# Patient Record
Sex: Female | Born: 1955
Health system: Southern US, Community
[De-identification: ages and names within clinical notes are randomized; demographics above are authoritative.]

## PROBLEM LIST (undated history)

## (undated) DIAGNOSIS — Z973 Presence of spectacles and contact lenses: Secondary | ICD-10-CM

## (undated) DIAGNOSIS — F329 Major depressive disorder, single episode, unspecified: Secondary | ICD-10-CM

## (undated) DIAGNOSIS — R079 Chest pain, unspecified: Secondary | ICD-10-CM

## (undated) DIAGNOSIS — K5909 Other constipation: Secondary | ICD-10-CM

## (undated) DIAGNOSIS — F32A Depression, unspecified: Secondary | ICD-10-CM

## (undated) DIAGNOSIS — Z972 Presence of dental prosthetic device (complete) (partial): Secondary | ICD-10-CM

## (undated) DIAGNOSIS — Z862 Personal history of diseases of the blood and blood-forming organs and certain disorders involving the immune mechanism: Secondary | ICD-10-CM

## (undated) DIAGNOSIS — I10 Essential (primary) hypertension: Secondary | ICD-10-CM

## (undated) DIAGNOSIS — E559 Vitamin D deficiency, unspecified: Secondary | ICD-10-CM

## (undated) DIAGNOSIS — D219 Benign neoplasm of connective and other soft tissue, unspecified: Secondary | ICD-10-CM

## (undated) DIAGNOSIS — N950 Postmenopausal bleeding: Secondary | ICD-10-CM

## (undated) DIAGNOSIS — K219 Gastro-esophageal reflux disease without esophagitis: Secondary | ICD-10-CM

## (undated) DIAGNOSIS — J45909 Unspecified asthma, uncomplicated: Secondary | ICD-10-CM

## (undated) HISTORY — DX: Essential (primary) hypertension: I10

## (undated) HISTORY — DX: Benign neoplasm of connective and other soft tissue, unspecified: D21.9

## (undated) HISTORY — PX: COLONOSCOPY: SHX174

## (undated) HISTORY — DX: Depression, unspecified: F32.A

## (undated) HISTORY — DX: Gastro-esophageal reflux disease without esophagitis: K21.9

## (undated) HISTORY — DX: Unspecified asthma, uncomplicated: J45.909

## (undated) HISTORY — DX: Major depressive disorder, single episode, unspecified: F32.9

## (undated) HISTORY — DX: Chest pain, unspecified: R07.9

---

## 1998-11-25 ENCOUNTER — Ambulatory Visit (HOSPITAL_COMMUNITY): Admission: RE | Admit: 1998-11-25 | Discharge: 1998-11-25 | Payer: Self-pay | Admitting: Internal Medicine

## 1998-11-25 ENCOUNTER — Encounter: Payer: Self-pay | Admitting: Internal Medicine

## 2000-09-13 ENCOUNTER — Encounter: Payer: Self-pay | Admitting: Internal Medicine

## 2000-09-13 ENCOUNTER — Encounter: Admission: RE | Admit: 2000-09-13 | Discharge: 2000-09-13 | Payer: Self-pay | Admitting: Internal Medicine

## 2001-09-17 ENCOUNTER — Encounter: Payer: Self-pay | Admitting: Internal Medicine

## 2001-09-17 ENCOUNTER — Encounter: Admission: RE | Admit: 2001-09-17 | Discharge: 2001-09-17 | Payer: Self-pay | Admitting: Internal Medicine

## 2002-02-27 ENCOUNTER — Emergency Department (HOSPITAL_COMMUNITY): Admission: EM | Admit: 2002-02-27 | Discharge: 2002-02-27 | Payer: Self-pay

## 2003-03-20 ENCOUNTER — Encounter: Payer: Self-pay | Admitting: Internal Medicine

## 2003-03-20 ENCOUNTER — Encounter: Admission: RE | Admit: 2003-03-20 | Discharge: 2003-03-20 | Payer: Self-pay | Admitting: Internal Medicine

## 2004-10-27 ENCOUNTER — Ambulatory Visit: Payer: Self-pay | Admitting: Internal Medicine

## 2004-11-11 ENCOUNTER — Encounter: Admission: RE | Admit: 2004-11-11 | Discharge: 2004-11-11 | Payer: Self-pay | Admitting: Internal Medicine

## 2005-07-07 ENCOUNTER — Emergency Department (HOSPITAL_COMMUNITY): Admission: EM | Admit: 2005-07-07 | Discharge: 2005-07-07 | Payer: Self-pay | Admitting: Emergency Medicine

## 2005-09-06 ENCOUNTER — Ambulatory Visit: Payer: Self-pay | Admitting: Internal Medicine

## 2005-11-22 ENCOUNTER — Ambulatory Visit: Payer: Self-pay | Admitting: Internal Medicine

## 2005-11-24 ENCOUNTER — Ambulatory Visit: Payer: Self-pay | Admitting: Internal Medicine

## 2005-11-26 ENCOUNTER — Emergency Department (HOSPITAL_COMMUNITY): Admission: EM | Admit: 2005-11-26 | Discharge: 2005-11-26 | Payer: Self-pay | Admitting: Family Medicine

## 2005-12-05 ENCOUNTER — Encounter: Admission: RE | Admit: 2005-12-05 | Discharge: 2005-12-05 | Payer: Self-pay | Admitting: Internal Medicine

## 2005-12-09 ENCOUNTER — Ambulatory Visit: Payer: Self-pay | Admitting: Internal Medicine

## 2005-12-09 ENCOUNTER — Encounter (INDEPENDENT_AMBULATORY_CARE_PROVIDER_SITE_OTHER): Payer: Self-pay | Admitting: *Deleted

## 2005-12-28 ENCOUNTER — Encounter: Admission: RE | Admit: 2005-12-28 | Discharge: 2005-12-28 | Payer: Self-pay | Admitting: Internal Medicine

## 2005-12-29 ENCOUNTER — Emergency Department (HOSPITAL_COMMUNITY): Admission: EM | Admit: 2005-12-29 | Discharge: 2005-12-29 | Payer: Self-pay | Admitting: Family Medicine

## 2006-06-12 ENCOUNTER — Ambulatory Visit (HOSPITAL_COMMUNITY): Admission: RE | Admit: 2006-06-12 | Discharge: 2006-06-12 | Payer: Self-pay | Admitting: Family Medicine

## 2006-06-12 ENCOUNTER — Emergency Department (HOSPITAL_COMMUNITY): Admission: EM | Admit: 2006-06-12 | Discharge: 2006-06-12 | Payer: Self-pay | Admitting: Family Medicine

## 2006-12-08 ENCOUNTER — Ambulatory Visit: Payer: Self-pay | Admitting: Internal Medicine

## 2006-12-11 ENCOUNTER — Encounter: Payer: Self-pay | Admitting: Internal Medicine

## 2006-12-11 LAB — CONVERTED CEMR LAB
Alkaline Phosphatase: 55 units/L (ref 39–117)
BUN: 14 mg/dL (ref 6–23)
Bilirubin, Direct: 0.1 mg/dL (ref 0.0–0.3)
CO2: 33 meq/L — ABNORMAL HIGH (ref 19–32)
Cholesterol: 205 mg/dL (ref 0–200)
Direct LDL: 149.1 mg/dL
GFR calc Af Amer: 85 mL/min
Glucose, Bld: 95 mg/dL (ref 70–99)
HDL: 45 mg/dL (ref >39.0)
Potassium: 3.6 meq/L (ref 3.5–5.1)
Sodium: 143 meq/L (ref 135–145)
TSH: 2.23 microintl units/mL (ref 0.35–5.50)
Total Bilirubin: 0.8 mg/dL (ref 0.3–1.2)
Total Protein: 6.9 g/dL (ref 6.0–8.3)
Triglycerides: 122 mg/dL (ref 0–149)
Vit D, 1,25-Dihydroxy: 20 (ref 20–57)

## 2007-01-27 ENCOUNTER — Emergency Department (HOSPITAL_COMMUNITY): Admission: EM | Admit: 2007-01-27 | Discharge: 2007-01-27 | Payer: Self-pay | Admitting: Family Medicine

## 2007-03-02 ENCOUNTER — Emergency Department (HOSPITAL_COMMUNITY): Admission: EM | Admit: 2007-03-02 | Discharge: 2007-03-02 | Payer: Self-pay | Admitting: Family Medicine

## 2007-05-14 ENCOUNTER — Encounter: Admission: RE | Admit: 2007-05-14 | Discharge: 2007-05-14 | Payer: Self-pay | Admitting: Internal Medicine

## 2007-05-16 ENCOUNTER — Encounter: Admission: RE | Admit: 2007-05-16 | Discharge: 2007-05-16 | Payer: Self-pay | Admitting: Internal Medicine

## 2007-06-09 ENCOUNTER — Encounter: Payer: Self-pay | Admitting: Internal Medicine

## 2007-06-09 DIAGNOSIS — F329 Major depressive disorder, single episode, unspecified: Secondary | ICD-10-CM | POA: Insufficient documentation

## 2007-06-09 DIAGNOSIS — F3289 Other specified depressive episodes: Secondary | ICD-10-CM | POA: Insufficient documentation

## 2007-07-10 ENCOUNTER — Telehealth (INDEPENDENT_AMBULATORY_CARE_PROVIDER_SITE_OTHER): Payer: Self-pay | Admitting: *Deleted

## 2007-08-28 ENCOUNTER — Ambulatory Visit: Payer: Self-pay | Admitting: Internal Medicine

## 2007-08-28 DIAGNOSIS — R635 Abnormal weight gain: Secondary | ICD-10-CM | POA: Insufficient documentation

## 2007-08-28 DIAGNOSIS — E559 Vitamin D deficiency, unspecified: Secondary | ICD-10-CM | POA: Insufficient documentation

## 2007-08-28 DIAGNOSIS — R609 Edema, unspecified: Secondary | ICD-10-CM | POA: Insufficient documentation

## 2007-11-12 ENCOUNTER — Emergency Department (HOSPITAL_COMMUNITY): Admission: EM | Admit: 2007-11-12 | Discharge: 2007-11-12 | Payer: Self-pay | Admitting: Emergency Medicine

## 2007-12-03 ENCOUNTER — Emergency Department (HOSPITAL_COMMUNITY): Admission: EM | Admit: 2007-12-03 | Discharge: 2007-12-03 | Payer: Self-pay | Admitting: Family Medicine

## 2007-12-12 ENCOUNTER — Ambulatory Visit: Payer: Self-pay | Admitting: Internal Medicine

## 2007-12-12 LAB — CONVERTED CEMR LAB
AST: 18 units/L (ref 0–37)
Albumin: 3.9 g/dL (ref 3.5–5.2)
Alkaline Phosphatase: 51 units/L (ref 39–117)
BUN: 23 mg/dL (ref 6–23)
Basophils Relative: 0 % (ref 0.0–1.0)
Chloride: 106 meq/L (ref 96–112)
Creatinine, Ser: 1 mg/dL (ref 0.4–1.2)
Eosinophils Absolute: 0.2 10*3/uL (ref 0.0–0.7)
Eosinophils Relative: 3.4 % (ref 0.0–5.0)
GFR calc Af Amer: 75 mL/min
GFR calc non Af Amer: 62 mL/min
Glucose, Bld: 114 mg/dL — ABNORMAL HIGH (ref 70–99)
HCT: 38.6 % (ref 36.0–46.0)
Hemoglobin: 12.8 g/dL (ref 12.0–15.0)
MCV: 85.3 fL (ref 78.0–100.0)
Monocytes Absolute: 0.4 10*3/uL (ref 0.1–1.0)
Monocytes Relative: 6.2 % (ref 3.0–12.0)
Potassium: 4.1 meq/L (ref 3.5–5.1)
RBC: 4.52 M/uL (ref 3.87–5.11)
TSH: 1.7 microintl units/mL (ref 0.35–5.50)
Total CHOL/HDL Ratio: 4.4
Total Protein: 6.9 g/dL (ref 6.0–8.3)
WBC: 6.4 10*3/uL (ref 4.5–10.5)

## 2007-12-13 ENCOUNTER — Encounter: Payer: Self-pay | Admitting: Internal Medicine

## 2007-12-18 ENCOUNTER — Ambulatory Visit: Payer: Self-pay | Admitting: Internal Medicine

## 2007-12-24 LAB — CONVERTED CEMR LAB
Bilirubin Urine: NEGATIVE
Hemoglobin, Urine: NEGATIVE
Total Protein, Urine: NEGATIVE mg/dL
Urine Glucose: NEGATIVE mg/dL
Urobilinogen, UA: 0.2 (ref 0.0–1.0)

## 2008-01-23 ENCOUNTER — Encounter: Payer: Self-pay | Admitting: Internal Medicine

## 2008-03-20 ENCOUNTER — Emergency Department (HOSPITAL_COMMUNITY): Admission: EM | Admit: 2008-03-20 | Discharge: 2008-03-20 | Payer: Self-pay | Admitting: Emergency Medicine

## 2008-04-25 ENCOUNTER — Ambulatory Visit: Payer: Self-pay | Admitting: Internal Medicine

## 2008-04-25 DIAGNOSIS — R42 Dizziness and giddiness: Secondary | ICD-10-CM | POA: Insufficient documentation

## 2008-05-19 ENCOUNTER — Encounter: Admission: RE | Admit: 2008-05-19 | Discharge: 2008-05-19 | Payer: Self-pay | Admitting: Internal Medicine

## 2008-12-29 ENCOUNTER — Emergency Department (HOSPITAL_COMMUNITY): Admission: EM | Admit: 2008-12-29 | Discharge: 2008-12-29 | Payer: Self-pay | Admitting: Family Medicine

## 2009-06-02 ENCOUNTER — Encounter: Admission: RE | Admit: 2009-06-02 | Discharge: 2009-06-02 | Payer: Self-pay | Admitting: Internal Medicine

## 2009-09-22 ENCOUNTER — Emergency Department (HOSPITAL_COMMUNITY): Admission: EM | Admit: 2009-09-22 | Discharge: 2009-09-22 | Payer: Self-pay | Admitting: Family Medicine

## 2009-11-24 ENCOUNTER — Emergency Department (HOSPITAL_COMMUNITY): Admission: EM | Admit: 2009-11-24 | Discharge: 2009-11-24 | Payer: Self-pay | Admitting: Gastroenterology

## 2009-12-15 ENCOUNTER — Ambulatory Visit: Payer: Self-pay | Admitting: Internal Medicine

## 2009-12-15 DIAGNOSIS — J309 Allergic rhinitis, unspecified: Secondary | ICD-10-CM | POA: Insufficient documentation

## 2009-12-16 LAB — CONVERTED CEMR LAB
Alkaline Phosphatase: 58 units/L (ref 39–117)
BUN: 12 mg/dL (ref 6–23)
Basophils Absolute: 0.1 10*3/uL (ref 0.0–0.1)
Basophils Relative: 1.1 % (ref 0.0–3.0)
Bilirubin Urine: NEGATIVE
Bilirubin, Direct: 0.1 mg/dL (ref 0.0–0.3)
CO2: 31 meq/L (ref 19–32)
Calcium: 9.1 mg/dL (ref 8.4–10.5)
Chloride: 105 meq/L (ref 96–112)
Creatinine, Ser: 1 mg/dL (ref 0.4–1.2)
Eosinophils Absolute: 0.2 10*3/uL (ref 0.0–0.7)
Glucose, Bld: 79 mg/dL (ref 70–99)
Leukocytes, UA: NEGATIVE
Lymphocytes Relative: 29.4 % (ref 12.0–46.0)
MCHC: 34.3 g/dL (ref 30.0–36.0)
MCV: 82.5 fL (ref 78.0–100.0)
Monocytes Absolute: 0.5 10*3/uL (ref 0.1–1.0)
Neutrophils Relative %: 58.9 % (ref 43.0–77.0)
Nitrite: NEGATIVE
Platelets: 235 10*3/uL (ref 150.0–400.0)
RDW: 14.8 % — ABNORMAL HIGH (ref 11.5–14.6)
TSH: 2.33 microintl units/mL (ref 0.35–5.50)
Total Bilirubin: 0.6 mg/dL (ref 0.3–1.2)
Total Protein: 6.9 g/dL (ref 6.0–8.3)
pH: 5.5 (ref 5.0–8.0)

## 2010-04-08 ENCOUNTER — Telehealth: Payer: Self-pay | Admitting: Internal Medicine

## 2010-05-08 ENCOUNTER — Emergency Department (HOSPITAL_COMMUNITY): Admission: EM | Admit: 2010-05-08 | Discharge: 2010-05-08 | Payer: Self-pay | Admitting: Emergency Medicine

## 2010-05-13 ENCOUNTER — Emergency Department (HOSPITAL_COMMUNITY): Admission: EM | Admit: 2010-05-13 | Discharge: 2010-05-13 | Payer: Self-pay | Admitting: Emergency Medicine

## 2010-07-14 ENCOUNTER — Encounter: Admission: RE | Admit: 2010-07-14 | Discharge: 2010-07-14 | Payer: Self-pay | Admitting: Internal Medicine

## 2010-08-25 ENCOUNTER — Emergency Department (HOSPITAL_COMMUNITY)
Admission: EM | Admit: 2010-08-25 | Discharge: 2010-08-25 | Payer: Self-pay | Source: Home / Self Care | Admitting: Family Medicine

## 2010-09-03 ENCOUNTER — Ambulatory Visit
Admission: RE | Admit: 2010-09-03 | Discharge: 2010-09-03 | Payer: Self-pay | Source: Home / Self Care | Attending: Internal Medicine | Admitting: Internal Medicine

## 2010-09-03 DIAGNOSIS — R030 Elevated blood-pressure reading, without diagnosis of hypertension: Secondary | ICD-10-CM | POA: Insufficient documentation

## 2010-09-03 DIAGNOSIS — Z860100 Personal history of colon polyps, unspecified: Secondary | ICD-10-CM | POA: Insufficient documentation

## 2010-09-12 ENCOUNTER — Encounter: Payer: Self-pay | Admitting: Internal Medicine

## 2010-09-23 NOTE — Progress Notes (Signed)
Summary: Phentermine Rf  Phone Note Refill Request Message from:  Fax from Pharmacy  Refills Requested: Medication #1:  PHENTERMINE HCL 37.5 MG TABS 1 by mouth qam   Dosage confirmed as above?Dosage Confirmed   Supply Requested: 30   Last Refilled: 02/24/2010 [facility]   [phone]   Method Requested: Telephone to Pharmacy Initial call taken by: Lanier Prude, [facility]),  April 08, 2010 2:57 PM  Follow-up for Phone Call        ok to ref once  needs ov Follow-up by: Tresa Garter MD,  April 09, 2010 7:32 AM    Prescriptions: PHENTERMINE HCL 37.5 MG TABS (PHENTERMINE HCL) 1 by mouth qam  #30 x 0   Entered by:   Ami Bullins CMA   Authorized by:   Tresa Garter MD   Signed by:   Bill Salinas CMA on 04/09/2010   Method used:   Telephoned to ...       [facility] [address] [phone]* (retail)       [address]       [place], [place]  [address]       Ph: [phone] or [phone]       Fax: [phone]   RxID:   [id number]

## 2010-09-23 NOTE — Assessment & Plan Note (Signed)
Summary: FU--STC   Vital Signs:  Patient profile:   55 year old female Weight:      241.25 pounds (109.66 kg) O2 Sat:      96 % on Room air Temp:     98.2 degrees F (36.78 degrees C) oral Pulse rate:   67 / minute Pulse rhythm:   regular BP sitting:   112 / 72  (left arm) Cuff size:   regular  Vitals Entered By: Brenton Grills (December 15, 2009 4:26 PM)  O2 Flow:  Room air CC: pt here for f/u visit/aj   CC:  pt here for f/u visit/aj.  Current Medications (verified): 1)  Lasix 20 Mg  Tabs (Furosemide) .... As Needed 2)  Klor-Con M20 20 Meq  Tbcr (Potassium Chloride Crys Cr) .... As Needed 3)  Vitamin D3 1000 Unit  Tabs (Cholecalciferol) .Marland Kitchen.. 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   Nose:  External nasal examination shows no deformity or inflammation. Nasal mucosa are pink and moist without lesions or exudates. Mouth:  Oral mucosa and oropharynx without lesions or exudates.  Teeth in good repair. Lungs:  Normal respiratory effort, chest expands symmetrically. Lungs are clear to auscultation, no crackles or wheezes. Heart:  Normal rate and regular rhythm. S1 and S2 normal without gallop, murmur, click, rub or other extra sounds. Extremities:  trace left pedal edema and trace right pedal edema.   Neurologic:  H-p (+) on R Skin:  Intact without suspicious  lesions or rashes Psych:  Cognition and judgment appear intact. Alert and cooperative with normal attention span and concentration. No apparent delusions, illusions, hallucinations Not depressed   Impression & Recommendations:  Problem # 1:  WEIGHT GAIN (ICD-783.1)  Discussed diet Wt watchers Phentermine. Risks vs benefits and controversies of a long term wt loss meds use were discussed.   Orders: TLB-B12, Serum-Total ONLY (04540-J81) TLB-BMP (Basic Metabolic Panel-BMET) (80048-METABOL) TLB-CBC Platelet - w/Differential (85025-CBCD) TLB-Hepatic/Liver Function Pnl (80076-HEPATIC) TLB-Sedimentation Rate (ESR) (85652-ESR) TLB-TSH (Thyroid Stimulating Hormone) (84443-TSH) TLB-Udip ONLY (81003-UDIP)  Problem # 2:  OBESITY (ICD-278.00) Assessment: Deteriorated  Problem # 3:  EDEMA (ICD-782.3) Assessment: Unchanged  Her updated medication list for this problem includes:    Lasix 20 Mg Tabs (Furosemide) .Marland Kitchen... As needed  Orders: TLB-B12, Serum-Total ONLY (19147-W29) TLB-BMP (Basic Metabolic Panel-BMET) (80048-METABOL) TLB-CBC Platelet - w/Differential (85025-CBCD) TLB-Hepatic/Liver Function Pnl (80076-HEPATIC) TLB-Sedimentation Rate (ESR) (85652-ESR) TLB-TSH (Thyroid Stimulating Hormone) (84443-TSH) TLB-Udip ONLY (81003-UDIP)  Problem # 4:  ALLERGIC RHINITIS (ICD-477.9) Assessment: Deteriorated  Her updated medication list for this problem includes:    Loratadine 10 Mg Tabs (Loratadine) .Marland Kitchen... 1 by mouth once daily as needed allergies  Complete Medication List: 1)  Lasix 20 Mg Tabs (Furosemide) .... As needed 2)  Klor-con M20 20 Meq Tbcr (Potassium chloride crys cr) .... As needed 3)  Vitamin D3 1000 Unit Tabs (Cholecalciferol) .Marland Kitchen.. 1 qd  4)  Phentermine Hcl 37.5 Mg Tabs (Phentermine hcl) .Marland Kitchen.. 1 by mouth qam 5)  Loratadine 10 Mg Tabs (Loratadine) .Marland Kitchen.. 1 by mouth once daily as needed allergies  Patient Instructions: 1)  Please schedule a follow-up appointment in 3  months. Prescriptions: LORATADINE 10 MG TABS (LORATADINE) 1 by mouth once daily as needed allergies  #90 x 2   Entered and Authorized by:   Tresa Garter MD   Signed by:   Tresa Garter MD on 12/15/2009   Method used:   Print then Give to Patient   RxID:   [id number] KLOR-CON M20 20 MEQ  TBCR (POTASSIUM CHLORIDE CRYS CR) as needed  #30 Tablet x 12   Entered and Authorized by:   Tresa Garter MD   Signed by:   Tresa Garter MD on 12/15/2009   Method used:   Print then Give to Patient   RxID:   [id number] LASIX 20 MG  TABS (FUROSEMIDE) as needed  #30 x 12   Entered and Authorized by:   Tresa Garter MD   Signed by:   Tresa Garter MD on 12/15/2009   Method used:   Print then Give to Patient   RxID:   [id number] PHENTERMINE HCL 37.5 MG TABS (PHENTERMINE HCL) 1 by mouth qam  #30 x 2   Entered and Authorized by:   Tresa Garter MD   Signed by:   Tresa Garter MD on 12/15/2009   Method used:   Print then Give to Patient   RxID:   [id number]

## 2010-09-23 NOTE — Assessment & Plan Note (Signed)
Summary: FU---STC   Vital Signs:  Patient profile:   55 year old female Weight:      246 pounds Temp:     98.0 degrees F oral Pulse rate:   84 / minute Pulse rhythm:   regular Resp:     16 per minute BP sitting:   138 / 90  (left arm) Cuff size:   large  Vitals Entered By: Lanier Prude, CMA(AAMA) (September 03, 2010 9:30 AM) CC: f/u  Is Patient Diabetic? No   CC:  f/u .  History of Present Illness: The patient presents for a preventive health examination  C/o obesity  Current Medications (verified): 1)  Lasix 20 Mg  Tabs (Furosemide) .... As Needed 2)  Klor-Con M20 20 Meq  Tbcr (Potassium Chloride Crys Cr) .... As Needed 3)  Vitamin D3 1000 Unit  Tabs (Cholecalciferol) .Marland Kitchen.. 1 Qd 4)  Phentermine Hcl 37.5 Mg Tabs (Phentermine Hcl) .Marland Kitchen.. 1 By Mouth Qam 5)  Loratadine 10 Mg Tabs (Loratadine) .Marland Kitchen.. 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 Former Smoker  Past Medical History: Depression Fluid retention Obesity family h/o dm-2 family h/o breast ca      mother colon ca      sister berast ca Elevated BP    Allergic rhinitis Colonic polyps, hx of  Review of Systems  The patient denies anorexia, fever, weight loss, weight gain, vision loss, decreased hearing, hoarseness, chest pain, syncope, dyspnea on exertion, peripheral edema, prolonged cough, headaches, hemoptysis, abdominal pain, melena, hematochezia, severe indigestion/heartburn, hematuria, incontinence, genital sores, muscle weakness, suspicious skin lesions,  transient blindness, difficulty walking, depression, unusual weight change, abnormal bleeding, enlarged lymph nodes, angioedema, and breast masses.    Physical Exam  General:  overweight-appearing.   Head:  Normocephalic and atraumatic without obvious abnormalities. No apparent alopecia or balding. Eyes:  No corneal or conjunctival inflammation noted. EOMI. Perrla. Ears:  External ear exam shows no significant lesions or deformities.  Otoscopic examination reveals clear canals, tympanic membranes are intact bilaterally without bulging, retraction, inflammation or discharge. Hearing is grossly normal bilaterally. Nose:  External nasal examination shows no deformity or inflammation. Nasal mucosa are pink and moist without lesions or exudates. Mouth:  Oral mucosa and oropharynx without lesions or exudates.  Teeth in good repair. Neck:  No deformities, masses, or tenderness noted. Lungs:  Normal respiratory effort, chest expands symmetrically. Lungs are clear to auscultation, no crackles or wheezes. Heart:  Normal rate and regular rhythm. S1 and S2 normal without gallop, murmur, click, rub or other extra sounds. Abdomen:  Bowel sounds positive,abdomen soft and non-tender without masses, organomegaly or hernias noted. Msk:  No deformity or scoliosis noted of thoracic or lumbar spine.   Pulses:  R and L carotid,radial,femoral,dorsalis pedis and posterior tibial pulses are full and equal bilaterally Extremities:  No clubbing, cyanosis, edema, or deformity noted with normal full range of motion of all joints.   Neurologic:  No cranial nerve deficits noted. Station and gait are normal. Plantar reflexes are down-going bilaterally. DTRs are symmetrical throughout. Sensory, motor and coordinative functions appear intact. Skin:  Intact  without suspicious lesions or rashes Cervical Nodes:  No lymphadenopathy noted Inguinal Nodes:  No significant adenopathy Psych:  Cognition and judgment appear intact. Alert  and cooperative with normal attention span and concentration. No apparent delusions, illusions, hallucinations   Impression & Recommendations:  Problem # 1:  HEALTH MAINTENANCE EXAM (ICD-V70.0) Assessment New Sch GYN Colon due this year Health and age related issues were discussed. Available screening tests and vaccinations were discussed as well. Healthy life style including good diet and exercise was discussed.  Will get labs  Problem # 2:  OBESITY (ICD-278.00) Assessment: Deteriorated On the regimen of medicine(s) reflected in the chart   Risks vs benefits and controversies of a long term phentermine  use were discussed.   Problem # 3:  ELEVATED BLOOD PRESSURE (ICD-796.2) Assessment: New  The following medications were removed from the medication list:    Lasix 20 Mg Tabs (Furosemide) .Marland Kitchen... As needed Her updated medication list for this problem includes:    Hydrochlorothiazide 12.5 Mg Caps (Hydrochlorothiazide) .Marland Kitchen... 1 by mouth once daily for blood pressure  Problem # 4:  ALLERGIC RHINITIS (ICD-477.9) Assessment: Unchanged  Her updated medication list for this problem includes:    Loratadine 10 Mg Tabs (Loratadine) .Marland Kitchen... 1 by mouth once daily as needed allergies  Problem # 5:  VITAMIN D DEFICIENCY (ICD-268.9) Assessment: Comment Only On the regimen of medicine(s) reflected in the chart    Complete Medication List: 1)  Vitamin D3 1000 Unit Tabs (Cholecalciferol) .Marland Kitchen.. 1 qd 2)  Phentermine Hcl 37.5 Mg Tabs (Phentermine hcl) .Marland Kitchen.. 1 by mouth qam 3)  Loratadine 10 Mg Tabs (Loratadine) .Marland Kitchen.. 1 by mouth once daily as needed allergies 4)  Hydrochlorothiazide 12.5 Mg Caps (Hydrochlorothiazide) .Marland Kitchen.. 1 by mouth once daily for blood pressure  Other Orders: Gastroenterology Referral (GI)  Patient Instructions: 1)  Please schedule a follow-up appointment in 4 months 2)  Labs this month 3)  BMP prior to visit, ICD-9: 4)  Hepatic Panel prior to visit, ICD-9: 5)  Lipid Panel prior to  visit, ICD-9:v70.0 6)  TSH prior to visit, ICD-9: 7)  CBC w/ Diff prior to visit, ICD-9: 8)  Urine-dip prior to visit, ICD-9: 9)  It is important that you exercise regularly at least 20 minutes 5 times a week. If you develop chest pain, have severe difficulty breathing, or feel very tired , stop exercising immediately and seek medical attention. 10)  You need to lose weight. Consider a lower calorie diet and regular exercise.  Prescriptions: PHENTERMINE HCL 37.5 MG TABS (PHENTERMINE HCL) 1 by mouth qam  #30 x 2   Entered and Authorized by:   Tresa Garter MD   Signed by:   Tresa Garter MD on 09/03/2010   Method used:   Print then Give to Patient   RxID:   [id number] HYDROCHLOROTHIAZIDE 12.5 MG CAPS (HYDROCHLOROTHIAZIDE) 1 by mouth once daily for blood pressure  #90 x 3   Entered and Authorized by:   Tresa Garter MD   Signed by:   Tresa Garter MD on 09/03/2010   Method used:   Electronically to        [facility]* (retail)       [address]       [address] Shipping/mailing       [place], [place]  [address]       Ph: [phone]       Fax: [phone]   RxID:   [id number]  Orders Added: 1)  Gastroenterology Referral [GI] 2)  Est. Patient 40-64 years [id number]

## 2010-10-05 ENCOUNTER — Telehealth: Payer: Self-pay | Admitting: Internal Medicine

## 2010-10-13 NOTE — Progress Notes (Signed)
Summary: Klor Con-ok to Rf?  Phone Note From Pharmacy   [facility] [phone]* Summary of Call: rec Rf request for Klor Con. This med was removed from active list. ok to Rf?? Initial call taken by: Lanier Prude, [facility]),  October 05, 2010 1:49 PM  Follow-up for Phone Call        ok 6 ref Follow-up by: Tresa Garter MD,  October 05, 2010 2:09 PM    New/Updated Medications: KLOR-CON M20 20 MEQ CR-TABS (POTASSIUM CHLORIDE CRYS CR) take 1 by mouth once daily Prescriptions: KLOR-CON M20 20 MEQ CR-TABS (POTASSIUM CHLORIDE CRYS CR) take 1 by mouth once daily  #30 x 5   Entered by:   Lanier Prude, [facility])   Authorized by:   Tresa Garter MD   Signed by:   Lanier Prude, [facility]) on 10/05/2010   Method used:   [facility]ronically to        [facility] [address] [phone]* (retail)       [address]       [place], [place]  [address]       Ph: [phone] or [phone]       Fax: [phone]   RxID:   [id number]

## 2010-11-04 LAB — POCT I-STAT, CHEM 8
Calcium, Ion: 1.11 mmol/L — ABNORMAL LOW (ref 1.12–1.32)
Glucose, Bld: 83 mg/dL (ref 70–99)
HCT: 42 % (ref 36.0–46.0)
Hemoglobin: 14.3 g/dL (ref 12.0–15.0)
Potassium: 4 mEq/L (ref 3.5–5.1)

## 2010-11-04 LAB — D-DIMER, QUANTITATIVE: D-Dimer, Quant: 0.22 ug/mL-FEU (ref 0.00–0.48)

## 2010-12-09 ENCOUNTER — Telehealth: Payer: Self-pay | Admitting: Internal Medicine

## 2010-12-09 NOTE — Telephone Encounter (Signed)
Pt inquiring as to name of MD for GI referral. Pt informed and given telephone contact# for [facility] [[phone]]

## 2010-12-17 ENCOUNTER — Other Ambulatory Visit: Payer: Self-pay | Admitting: Obstetrics and Gynecology

## 2011-01-06 ENCOUNTER — Telehealth: Payer: Self-pay | Admitting: *Deleted

## 2011-01-06 NOTE — Telephone Encounter (Signed)
rec rf req for Phentermine 37.5mg . 1 po qam. # 30. Last filled 11-30-10. Ok to Rf?

## 2011-01-06 NOTE — Telephone Encounter (Signed)
OK to fill this prescription with additional refills x2 Thank you!  

## 2011-01-07 MED ORDER — PHENTERMINE HCL 37.5 MG PO CAPS: 37.5000 mg | ORAL_CAPSULE | ORAL | Status: DC

## 2011-01-18 ENCOUNTER — Ambulatory Visit (AMBULATORY_SURGERY_CENTER): Payer: 59 | Admitting: *Deleted

## 2011-01-18 VITALS — Ht 62.0 in | Wt 238.0 lb

## 2011-01-18 DIAGNOSIS — Z1211 Encounter for screening for malignant neoplasm of colon: Secondary | ICD-10-CM

## 2011-01-18 DIAGNOSIS — Z800 Family history of malignant neoplasm of digestive organs: Secondary | ICD-10-CM

## 2011-01-18 MED ORDER — PEG-KCL-NACL-NASULF-NA ASC-C 100 G PO SOLR: ORAL | Status: DC

## 2011-01-25 ENCOUNTER — Ambulatory Visit (AMBULATORY_SURGERY_CENTER): Payer: 59 | Admitting: Internal Medicine

## 2011-01-25 ENCOUNTER — Encounter: Payer: Self-pay | Admitting: Internal Medicine

## 2011-01-25 VITALS — BP 128/76 | HR 95 | Temp 96.6°F | Resp 20 | Ht 62.0 in | Wt 236.0 lb

## 2011-01-25 DIAGNOSIS — Z860100 Personal history of colon polyps, unspecified: Secondary | ICD-10-CM

## 2011-01-25 DIAGNOSIS — Z800 Family history of malignant neoplasm of digestive organs: Secondary | ICD-10-CM

## 2011-01-25 DIAGNOSIS — Z1211 Encounter for screening for malignant neoplasm of colon: Secondary | ICD-10-CM

## 2011-01-25 MED ORDER — SODIUM CHLORIDE 0.9 % IV SOLN: 500.0000 mL | INTRAVENOUS | Status: DC

## 2011-01-25 NOTE — Patient Instructions (Signed)
Please review discharge instructions.

## 2011-01-26 ENCOUNTER — Telehealth: Payer: Self-pay

## 2011-01-26 NOTE — Telephone Encounter (Signed)

## 2011-05-20 LAB — POCT I-STAT, CHEM 8
Calcium, Ion: 1.2
Chloride: 103
Glucose, Bld: 108 — ABNORMAL HIGH
HCT: 42
TCO2: 29

## 2011-07-25 ENCOUNTER — Other Ambulatory Visit: Payer: Self-pay | Admitting: Internal Medicine

## 2011-07-25 DIAGNOSIS — Z1231 Encounter for screening mammogram for malignant neoplasm of breast: Secondary | ICD-10-CM

## 2011-08-03 ENCOUNTER — Ambulatory Visit
Admission: RE | Admit: 2011-08-03 | Discharge: 2011-08-03 | Disposition: A | Discharge status: Home / Self Care | Payer: 59 | Source: Ambulatory Visit | Attending: Internal Medicine | Admitting: Internal Medicine

## 2011-08-03 DIAGNOSIS — Z1231 Encounter for screening mammogram for malignant neoplasm of breast: Secondary | ICD-10-CM

## 2011-09-21 ENCOUNTER — Other Ambulatory Visit: Payer: Self-pay | Admitting: Internal Medicine

## 2011-10-01 ENCOUNTER — Telehealth: Payer: Self-pay | Admitting: *Deleted

## 2011-10-01 DIAGNOSIS — Z0000 Encounter for general adult medical examination without abnormal findings: Secondary | ICD-10-CM

## 2011-10-01 NOTE — Telephone Encounter (Signed)
CPE labs entered.  

## 2011-10-01 NOTE — Telephone Encounter (Signed)
Message copied by Merrilyn Puma on Sat Oct 01, 2011 12:19 PM ------      Message from: Newell Coral      Created: Fri Sep 23, 2011  3:51 PM      Regarding: cpe sch, needs labs       The pt made a cpe apt for April, and is hoping to get labs put in. Thanks!

## 2011-10-14 ENCOUNTER — Other Ambulatory Visit: Payer: Self-pay | Admitting: Internal Medicine

## 2011-12-12 ENCOUNTER — Ambulatory Visit (INDEPENDENT_AMBULATORY_CARE_PROVIDER_SITE_OTHER): Payer: 59 | Admitting: Internal Medicine

## 2011-12-12 ENCOUNTER — Encounter: Payer: Self-pay | Admitting: Internal Medicine

## 2011-12-12 VITALS — BP 120/76 | HR 84 | Temp 98.6°F | Resp 16 | Ht 62.0 in | Wt 236.0 lb

## 2011-12-12 DIAGNOSIS — F329 Major depressive disorder, single episode, unspecified: Secondary | ICD-10-CM

## 2011-12-12 DIAGNOSIS — Z23 Encounter for immunization: Secondary | ICD-10-CM

## 2011-12-12 DIAGNOSIS — J309 Allergic rhinitis, unspecified: Secondary | ICD-10-CM

## 2011-12-12 DIAGNOSIS — F419 Anxiety disorder, unspecified: Secondary | ICD-10-CM

## 2011-12-12 DIAGNOSIS — K5900 Constipation, unspecified: Secondary | ICD-10-CM | POA: Insufficient documentation

## 2011-12-12 DIAGNOSIS — E559 Vitamin D deficiency, unspecified: Secondary | ICD-10-CM

## 2011-12-12 DIAGNOSIS — Z0000 Encounter for general adult medical examination without abnormal findings: Secondary | ICD-10-CM

## 2011-12-12 DIAGNOSIS — F3289 Other specified depressive episodes: Secondary | ICD-10-CM

## 2011-12-12 DIAGNOSIS — R030 Elevated blood-pressure reading, without diagnosis of hypertension: Secondary | ICD-10-CM

## 2011-12-12 DIAGNOSIS — F411 Generalized anxiety disorder: Secondary | ICD-10-CM

## 2011-12-12 MED ORDER — HYDROCHLOROTHIAZIDE 12.5 MG PO CAPS: 12.5000 mg | ORAL_CAPSULE | Freq: Two times a day (BID) | ORAL | Status: DC

## 2011-12-12 MED ORDER — POTASSIUM CHLORIDE CRYS ER 20 MEQ PO TBCR: 20.0000 meq | EXTENDED_RELEASE_TABLET | Freq: Every day | ORAL | Status: DC

## 2011-12-12 MED ORDER — LORAZEPAM 0.5 MG PO TABS: 0.5000 mg | ORAL_TABLET | Freq: Three times a day (TID) | ORAL | Status: AC | PRN

## 2011-12-12 MED ORDER — LORATADINE 10 MG PO TABS: 10.0000 mg | ORAL_TABLET | Freq: Every day | ORAL | Status: DC | PRN

## 2011-12-12 MED ORDER — VITAMIN D 1000 UNITS PO TABS: 1000.0000 [IU] | ORAL_TABLET | Freq: Every day | ORAL | Status: DC

## 2011-12-12 MED ORDER — POLYETHYLENE GLYCOL 3350 17 GM/SCOOP PO POWD: 17.0000 g | Freq: Every day | ORAL | Status: AC | PRN

## 2011-12-12 NOTE — Progress Notes (Signed)
  Subjective:    Patient ID: Jennifer Quinn, female    DOB: [birth year], 56 y.o.   MRN: [id number]  HPI  The patient is here for a wellness exam. The patient has been doing well overall without major physical or psychological issues going on lately. The patient needs to address chronic HTN, constipation, allergies. C/o anxiety  Review of Systems  Constitutional: Negative for fever, chills, diaphoresis, activity change, appetite change, fatigue and unexpected weight change.  HENT: Negative for hearing loss, ear pain, congestion, sore throat, sneezing, mouth sores, neck pain, dental problem, voice change, postnasal drip and sinus pressure.   Eyes: Negative for pain and visual disturbance.  Respiratory: Negative for cough, chest tightness, wheezing and stridor.   Cardiovascular: Negative for chest pain, palpitations and leg swelling.  Gastrointestinal: Negative for nausea, vomiting, abdominal pain, blood in stool, abdominal distention and rectal pain.  Genitourinary: Negative for dysuria, hematuria, decreased urine volume, vaginal bleeding, vaginal discharge, difficulty urinating, vaginal pain and menstrual problem.  Musculoskeletal: Negative for back pain, joint swelling and gait problem.  Skin: Negative for color change, rash and wound.  Neurological: Negative for dizziness, tremors, syncope, speech difficulty and light-headedness.  Hematological: Negative for adenopathy.  Psychiatric/Behavioral: Negative for suicidal ideas, hallucinations, behavioral problems, confusion, sleep disturbance, dysphoric mood and decreased concentration. The patient is nervous/anxious. The patient is not hyperactive.        Objective:   Physical Exam  Constitutional: She appears well-developed. No distress.       Obese   HENT:  Head: Normocephalic.  Right Ear: External ear normal.  Left Ear: External ear normal.  Nose: Nose normal.  Mouth/Throat: Oropharynx is clear and moist.  Eyes: Conjunctivae are normal.  Pupils are equal, round, and reactive to light. Right eye exhibits no discharge. Left eye exhibits no discharge.  Neck: Normal range of motion. Neck supple. No JVD present. No tracheal deviation present. No thyromegaly present.  Cardiovascular: Normal rate, regular rhythm and normal heart sounds.   Pulmonary/Chest: No stridor. No respiratory distress. She has no wheezes.  Abdominal: Soft. Bowel sounds are normal. She exhibits no distension and no mass. There is no tenderness. There is no rebound and no guarding.  Musculoskeletal: She exhibits no edema and no tenderness.  Lymphadenopathy:    She has no cervical adenopathy.  Neurological: She displays normal reflexes. No cranial nerve deficit. She exhibits normal muscle tone. Coordination normal.  Skin: No rash noted. No erythema.  Psychiatric: She has a normal mood and affect. Her behavior is normal. Judgment and thought content normal.    Lab Results  Component Value Date   WBC 6.4 12/15/2009   HGB 14.3 05/08/2010   HCT 42.0 05/08/2010   PLT 235.0 12/15/2009   GLUCOSE 83 05/08/2010   CHOL 194 12/12/2007   TRIG 122 12/12/2007   HDL 44.1 12/12/2007   LDLDIRECT 149.1 12/11/2006   LDLCALC 125* 12/12/2007   ALT 15 12/15/2009   AST 17 12/15/2009   NA 140 05/08/2010   K 4.0 05/08/2010   CL 104 05/08/2010   CREATININE 0.9 05/08/2010   BUN 15 05/08/2010   CO2 31 12/15/2009   TSH 2.33 12/15/2009         Assessment & Plan:

## 2011-12-12 NOTE — Assessment & Plan Note (Signed)
Will try Lorazepam prn

## 2011-12-12 NOTE — Assessment & Plan Note (Signed)
Continue with current prescription therapy as reflected on the Med list.  

## 2011-12-12 NOTE — Assessment & Plan Note (Signed)
miralax prn 

## 2011-12-12 NOTE — Assessment & Plan Note (Signed)
Not on Rx 

## 2011-12-12 NOTE — Assessment & Plan Note (Signed)
Discussed.

## 2011-12-12 NOTE — Assessment & Plan Note (Signed)
Better  

## 2011-12-12 NOTE — Assessment & Plan Note (Signed)
Continue with current prescription therapy as reflected on the Med list. prn 

## 2011-12-12 NOTE — Progress Notes (Signed)
Addended by: Merrilyn Puma on: 12/12/2011 03:52 PM   Modules accepted: Orders

## 2011-12-12 NOTE — Assessment & Plan Note (Addendum)
We discussed age appropriate health related issues, including available/recomended screening tests and vaccinations. We discussed a need for adhering to healthy diet and exercise. Labs/EKG were reviewed/ordered. All questions were answered. She has a GYN Dr Mammo q 12 mo Colon up to date  

## 2012-01-03 ENCOUNTER — Other Ambulatory Visit (INDEPENDENT_AMBULATORY_CARE_PROVIDER_SITE_OTHER): Payer: 59

## 2012-01-03 DIAGNOSIS — F3289 Other specified depressive episodes: Secondary | ICD-10-CM

## 2012-01-03 DIAGNOSIS — Z0000 Encounter for general adult medical examination without abnormal findings: Secondary | ICD-10-CM

## 2012-01-03 DIAGNOSIS — F419 Anxiety disorder, unspecified: Secondary | ICD-10-CM

## 2012-01-03 DIAGNOSIS — F329 Major depressive disorder, single episode, unspecified: Secondary | ICD-10-CM

## 2012-01-03 DIAGNOSIS — K5900 Constipation, unspecified: Secondary | ICD-10-CM

## 2012-01-03 DIAGNOSIS — E559 Vitamin D deficiency, unspecified: Secondary | ICD-10-CM

## 2012-01-03 DIAGNOSIS — F411 Generalized anxiety disorder: Secondary | ICD-10-CM

## 2012-01-03 DIAGNOSIS — J309 Allergic rhinitis, unspecified: Secondary | ICD-10-CM

## 2012-01-03 DIAGNOSIS — R030 Elevated blood-pressure reading, without diagnosis of hypertension: Secondary | ICD-10-CM

## 2012-01-03 LAB — HEPATIC FUNCTION PANEL
ALT: 15 U/L (ref 0–35)
AST: 18 U/L (ref 0–37)
Bilirubin, Direct: 0.1 mg/dL (ref 0.0–0.3)
Total Bilirubin: 0.8 mg/dL (ref 0.3–1.2)
Total Protein: 7.2 g/dL (ref 6.0–8.3)

## 2012-01-03 LAB — CBC WITH DIFFERENTIAL/PLATELET
Basophils Absolute: 0 10*3/uL (ref 0.0–0.1)
Basophils Relative: 0.6 % (ref 0.0–3.0)
Eosinophils Absolute: 0.2 10*3/uL (ref 0.0–0.7)
Eosinophils Relative: 3.6 % (ref 0.0–5.0)
HCT: 37.1 % (ref 36.0–46.0)
Hemoglobin: 12.1 g/dL (ref 12.0–15.0)
Lymphocytes Relative: 34.1 % (ref 12.0–46.0)
Lymphs Abs: 1.6 10*3/uL (ref 0.7–4.0)
MCHC: 32.7 g/dL (ref 30.0–36.0)
MCV: 83.7 fl (ref 78.0–100.0)
Monocytes Absolute: 0.3 10*3/uL (ref 0.1–1.0)
Monocytes Relative: 6.8 % (ref 3.0–12.0)
Neutro Abs: 2.6 10*3/uL (ref 1.4–7.7)
Neutrophils Relative %: 54.9 % (ref 43.0–77.0)
Platelets: 209 10*3/uL (ref 150.0–400.0)
RBC: 4.43 Mil/uL (ref 3.87–5.11)
RDW: 15.3 % — ABNORMAL HIGH (ref 11.5–14.6)
WBC: 4.7 10*3/uL (ref 4.5–10.5)

## 2012-01-03 LAB — URINALYSIS
Bilirubin Urine: NEGATIVE
Ketones, ur: NEGATIVE
Total Protein, Urine: NEGATIVE
Urine Glucose: NEGATIVE
pH: 6 (ref 5.0–8.0)

## 2012-01-03 LAB — BASIC METABOLIC PANEL
BUN: 13 mg/dL (ref 6–23)
CO2: 30 mEq/L (ref 19–32)
Calcium: 9.3 mg/dL (ref 8.4–10.5)
Chloride: 103 mEq/L (ref 96–112)
Creatinine, Ser: 0.9 mg/dL (ref 0.4–1.2)
GFR: 82.81 mL/min (ref >60.00)
Glucose, Bld: 95 mg/dL (ref 70–99)
Potassium: 3.7 mEq/L (ref 3.5–5.1)
Sodium: 141 mEq/L (ref 135–145)

## 2012-01-03 LAB — LIPID PANEL: Cholesterol: 190 mg/dL (ref 0–200)

## 2012-04-17 ENCOUNTER — Other Ambulatory Visit: Payer: Self-pay | Admitting: Internal Medicine

## 2012-05-10 ENCOUNTER — Encounter: Payer: Self-pay | Admitting: Internal Medicine

## 2012-06-13 ENCOUNTER — Ambulatory Visit: Payer: 59 | Admitting: Internal Medicine

## 2012-06-13 DIAGNOSIS — Z0289 Encounter for other administrative examinations: Secondary | ICD-10-CM

## 2012-10-12 ENCOUNTER — Other Ambulatory Visit: Payer: Self-pay | Admitting: Internal Medicine

## 2012-10-12 DIAGNOSIS — Z1231 Encounter for screening mammogram for malignant neoplasm of breast: Secondary | ICD-10-CM

## 2012-11-07 ENCOUNTER — Ambulatory Visit: Payer: 59

## 2012-11-07 ENCOUNTER — Ambulatory Visit
Admission: RE | Admit: 2012-11-07 | Discharge: 2012-11-07 | Disposition: A | Discharge status: Home / Self Care | Payer: 59 | Source: Ambulatory Visit | Attending: Internal Medicine | Admitting: Internal Medicine

## 2012-11-07 DIAGNOSIS — Z1231 Encounter for screening mammogram for malignant neoplasm of breast: Secondary | ICD-10-CM

## 2012-11-08 ENCOUNTER — Other Ambulatory Visit: Payer: Self-pay | Admitting: *Deleted

## 2012-11-08 MED ORDER — HYDROCHLOROTHIAZIDE 12.5 MG PO CAPS: 12.5000 mg | ORAL_CAPSULE | Freq: Two times a day (BID) | ORAL | Status: DC

## 2012-11-09 ENCOUNTER — Telehealth: Payer: Self-pay | Admitting: *Deleted

## 2012-11-09 NOTE — Telephone Encounter (Signed)
Rf req for phentermine 30 mg 1 po qd. # 30. Last filled 02/24/12. Ok to Rf in PCP absence?

## 2012-11-09 NOTE — Telephone Encounter (Signed)
Called pt to inform of below. No answer/unable to leave message. Will have scheduler contact for OV.

## 2012-11-09 NOTE — Telephone Encounter (Signed)
Jennifer Quinn, If she has been off it for 9 months, she needs to be seen before it will be refilled. Rene Kocher

## 2012-11-14 ENCOUNTER — Telehealth: Payer: Self-pay | Admitting: *Deleted

## 2012-11-14 NOTE — Telephone Encounter (Signed)
Rf req for phentermine 30 mg 1 po qd # 30. Last filled 02/24/12. Ok to Rf?

## 2012-11-16 MED ORDER — PHENTERMINE HCL 37.5 MG PO CAPS: 37.5000 mg | ORAL_CAPSULE | ORAL | Status: DC

## 2012-11-16 NOTE — Telephone Encounter (Signed)
Done. Will have [facility] schedulers contact pt for OV.

## 2012-11-16 NOTE — Telephone Encounter (Signed)
OK to fill this prescription with additional refills x1. Sch ov  Thank you!  

## 2012-11-19 ENCOUNTER — Telehealth: Payer: Self-pay | Admitting: Internal Medicine

## 2012-11-19 NOTE — Telephone Encounter (Signed)
From Stacy---- Please contact pt to schedule OV per MD. I Rf her meds X 2 months. Thanks!  Called pt left vm for pt to call and make an appt/pn

## 2012-11-26 ENCOUNTER — Encounter: Payer: Self-pay | Admitting: Internal Medicine

## 2012-11-26 ENCOUNTER — Ambulatory Visit (INDEPENDENT_AMBULATORY_CARE_PROVIDER_SITE_OTHER): Payer: 59 | Admitting: Internal Medicine

## 2012-11-26 VITALS — BP 120/62 | HR 76 | Temp 98.0°F | Resp 16 | Wt 234.0 lb

## 2012-11-26 DIAGNOSIS — Z0000 Encounter for general adult medical examination without abnormal findings: Secondary | ICD-10-CM

## 2012-11-26 MED ORDER — PHENTERMINE HCL 37.5 MG PO CAPS: 37.5000 mg | ORAL_CAPSULE | ORAL | Status: DC

## 2012-11-26 MED ORDER — HYDROCHLOROTHIAZIDE 12.5 MG PO CAPS: 12.5000 mg | ORAL_CAPSULE | Freq: Two times a day (BID) | ORAL | Status: DC

## 2012-11-26 MED ORDER — POTASSIUM CHLORIDE CRYS ER 20 MEQ PO TBCR: 20.0000 meq | EXTENDED_RELEASE_TABLET | Freq: Every day | ORAL | Status: DC

## 2012-11-26 NOTE — Assessment & Plan Note (Signed)
Lap band advised Wt Readings from Last 3 Encounters:  11/26/12 234 lb (106.142 kg)  12/12/11 236 lb (107.049 kg)  01/25/11 236 lb (107.049 kg)

## 2012-11-26 NOTE — Assessment & Plan Note (Signed)
We discussed age appropriate health related issues, including available/recomended screening tests and vaccinations. We discussed a need for adhering to healthy diet and exercise. Labs/EKG were reviewed/ordered. All questions were answered. She has a GYN Dr (?) Women's Mammo q 12 mo Colon up to date

## 2012-11-26 NOTE — Progress Notes (Signed)
  Subjective:    HPI  The patient is here for a wellness exam. The patient has been doing well overall without major physical or psychological issues going on lately. The patient needs to address chronic HTN, constipation, allergies. C/o anxiety  Review of Systems  Constitutional: Negative for fever, chills, diaphoresis, activity change, appetite change, fatigue and unexpected weight change.  HENT: Negative for hearing loss, ear pain, congestion, sore throat, sneezing, mouth sores, neck pain, dental problem, voice change, postnasal drip and sinus pressure.   Eyes: Negative for pain and visual disturbance.  Respiratory: Negative for cough, chest tightness, wheezing and stridor.   Cardiovascular: Negative for chest pain, palpitations and leg swelling.  Gastrointestinal: Negative for nausea, vomiting, abdominal pain, blood in stool, abdominal distention and rectal pain.  Genitourinary: Negative for dysuria, hematuria, decreased urine volume, vaginal bleeding, vaginal discharge, difficulty urinating, vaginal pain and menstrual problem.  Musculoskeletal: Negative for back pain, joint swelling and gait problem.  Skin: Negative for color change, rash and wound.  Neurological: Negative for dizziness, tremors, syncope, speech difficulty and light-headedness.  Hematological: Negative for adenopathy.  Psychiatric/Behavioral: Negative for suicidal ideas, hallucinations, behavioral problems, confusion, sleep disturbance, dysphoric mood and decreased concentration. The patient is nervous/anxious. The patient is not hyperactive.        Objective:   Physical Exam  Constitutional: She appears well-developed. No distress.  Obese   HENT:  Head: Normocephalic.  Right Ear: External ear normal.  Left Ear: External ear normal.  Nose: Nose normal.  Mouth/Throat: Oropharynx is clear and moist.  Eyes: Conjunctivae are normal. Pupils are equal, round, and reactive to light. Right eye exhibits no discharge.  Left eye exhibits no discharge.  Neck: Normal range of motion. Neck supple. No JVD present. No tracheal deviation present. No thyromegaly present.  Cardiovascular: Normal rate, regular rhythm and normal heart sounds.   Pulmonary/Chest: No stridor. No respiratory distress. She has no wheezes.  Abdominal: Soft. Bowel sounds are normal. She exhibits no distension and no mass. There is no tenderness. There is no rebound and no guarding.  Musculoskeletal: She exhibits no edema and no tenderness.  Lymphadenopathy:    She has no cervical adenopathy.  Neurological: She displays normal reflexes. No cranial nerve deficit. She exhibits normal muscle tone. Coordination normal.  Skin: No rash noted. No erythema.  Psychiatric: She has a normal mood and affect. Her behavior is normal. Judgment and thought content normal.    Lab Results  Component Value Date   WBC 4.7 01/03/2012   HGB 12.1 01/03/2012   HCT 37.1 01/03/2012   PLT 209.0 01/03/2012   GLUCOSE 95 01/03/2012   CHOL 190 01/03/2012   TRIG 116.0 01/03/2012   HDL 41.00 01/03/2012   LDLDIRECT 149.1 12/11/2006   LDLCALC 126* 01/03/2012   ALT 15 01/03/2012   AST 18 01/03/2012   NA 141 01/03/2012   K 3.7 01/03/2012   CL 103 01/03/2012   CREATININE 0.9 01/03/2012   BUN 13 01/03/2012   CO2 30 01/03/2012   TSH 2.25 01/03/2012         Assessment & Plan:

## 2013-01-29 ENCOUNTER — Other Ambulatory Visit: Payer: Self-pay | Admitting: Internal Medicine

## 2013-02-19 LAB — HM PAP SMEAR

## 2013-11-26 ENCOUNTER — Other Ambulatory Visit: Payer: Self-pay

## 2013-11-26 DIAGNOSIS — Z1231 Encounter for screening mammogram for malignant neoplasm of breast: Secondary | ICD-10-CM

## 2013-12-04 ENCOUNTER — Ambulatory Visit: Payer: 59

## 2013-12-09 ENCOUNTER — Ambulatory Visit
Admission: RE | Admit: 2013-12-09 | Discharge: 2013-12-09 | Disposition: A | Discharge status: Home / Self Care | Payer: PRIVATE HEALTH INSURANCE | Source: Ambulatory Visit

## 2013-12-09 DIAGNOSIS — Z1231 Encounter for screening mammogram for malignant neoplasm of breast: Secondary | ICD-10-CM

## 2013-12-19 ENCOUNTER — Other Ambulatory Visit: Payer: Self-pay | Admitting: *Deleted

## 2013-12-19 DIAGNOSIS — Z0000 Encounter for general adult medical examination without abnormal findings: Secondary | ICD-10-CM

## 2013-12-19 MED ORDER — HYDROCHLOROTHIAZIDE 12.5 MG PO CAPS: 12.5000 mg | ORAL_CAPSULE | Freq: Two times a day (BID) | ORAL | Status: DC

## 2014-01-20 ENCOUNTER — Ambulatory Visit (INDEPENDENT_AMBULATORY_CARE_PROVIDER_SITE_OTHER): Payer: PRIVATE HEALTH INSURANCE | Admitting: Internal Medicine

## 2014-01-20 ENCOUNTER — Encounter: Payer: Self-pay | Admitting: Internal Medicine

## 2014-01-20 VITALS — BP 120/76 | HR 80 | Temp 98.7°F | Resp 16 | Ht 62.0 in | Wt 248.0 lb

## 2014-01-20 DIAGNOSIS — R7309 Other abnormal glucose: Secondary | ICD-10-CM

## 2014-01-20 DIAGNOSIS — Z0000 Encounter for general adult medical examination without abnormal findings: Secondary | ICD-10-CM

## 2014-01-20 DIAGNOSIS — R030 Elevated blood-pressure reading, without diagnosis of hypertension: Secondary | ICD-10-CM

## 2014-01-20 DIAGNOSIS — R635 Abnormal weight gain: Secondary | ICD-10-CM

## 2014-01-20 DIAGNOSIS — R739 Hyperglycemia, unspecified: Secondary | ICD-10-CM

## 2014-01-20 MED ORDER — LORCASERIN HCL 10 MG PO TABS: 1.0000 | ORAL_TABLET | Freq: Two times a day (BID) | ORAL | Status: DC

## 2014-01-20 MED ORDER — POLYETHYLENE GLYCOL 3350 17 GM/SCOOP PO POWD: ORAL | Status: DC

## 2014-01-20 MED ORDER — HYDROCHLOROTHIAZIDE 12.5 MG PO CAPS: 12.5000 mg | ORAL_CAPSULE | Freq: Two times a day (BID) | ORAL | Status: DC

## 2014-01-20 MED ORDER — LORATADINE 10 MG PO TABS: 10.0000 mg | ORAL_TABLET | Freq: Every day | ORAL | Status: DC | PRN

## 2014-01-20 MED ORDER — POLYETHYLENE GLYCOL 3350 17 GM/SCOOP PO POWD
ORAL | Status: DC
Start: 2014-01-20 — End: 2014-01-20

## 2014-01-20 MED ORDER — POTASSIUM CHLORIDE CRYS ER 20 MEQ PO TBCR: 20.0000 meq | EXTENDED_RELEASE_TABLET | Freq: Every day | ORAL | Status: DC

## 2014-01-20 NOTE — Assessment & Plan Note (Signed)
Labs See above Wt Readings from Last 3 Encounters:  01/20/14 248 lb (112.492 kg)  11/26/12 234 lb (106.142 kg)  12/12/11 236 lb (107.049 kg)

## 2014-01-20 NOTE — Patient Instructions (Signed)
Zola's diet - plan Z 

## 2014-01-20 NOTE — Progress Notes (Signed)
Pre visit review using our clinic review tool, if applicable. No additional management support is needed unless otherwise documented below in the visit note. 

## 2014-01-20 NOTE — Assessment & Plan Note (Signed)
BP Readings from Last 3 Encounters:  01/20/14 120/76  11/26/12 120/62  12/12/11 120/76

## 2014-01-20 NOTE — Assessment & Plan Note (Signed)
Will try Belviq 

## 2014-01-20 NOTE — Assessment & Plan Note (Signed)
We discussed age appropriate health related issues, including available/recomended screening tests and vaccinations. We discussed a need for adhering to healthy diet and exercise. Labs/EKG were reviewed/ordered. All questions were answered. She has a GYN Dr Clotilde Dieter q 12 mo Colon up to date

## 2014-01-20 NOTE — Progress Notes (Signed)
  Subjective:    HPI  The patient is here for a wellness exam. The patient has been doing well overall without major physical or psychological issues going on lately. The patient needs to address chronic HTN, constipation, allergies. C/o anxiety. C/o wt gain  Wt Readings from Last 3 Encounters:  01/20/14 248 lb (112.492 kg)  11/26/12 234 lb (106.142 kg)  12/12/11 236 lb (107.049 kg)   BP Readings from Last 3 Encounters:  01/20/14 120/76  11/26/12 120/62  12/12/11 120/76      Review of Systems  Constitutional: Negative for fever, chills, diaphoresis, activity change, appetite change, fatigue and unexpected weight change.  HENT: Negative for congestion, dental problem, ear pain, hearing loss, mouth sores, postnasal drip, sinus pressure, sneezing, sore throat and voice change.   Eyes: Negative for pain and visual disturbance.  Respiratory: Negative for cough, chest tightness, wheezing and stridor.   Cardiovascular: Negative for chest pain, palpitations and leg swelling.  Gastrointestinal: Negative for nausea, vomiting, abdominal pain, blood in stool, abdominal distention and rectal pain.  Genitourinary: Negative for dysuria, hematuria, decreased urine volume, vaginal bleeding, vaginal discharge, difficulty urinating, vaginal pain and menstrual problem.  Musculoskeletal: Negative for back pain, gait problem, joint swelling and neck pain.  Skin: Negative for color change, rash and wound.  Neurological: Negative for dizziness, tremors, syncope, speech difficulty and light-headedness.  Hematological: Negative for adenopathy.  Psychiatric/Behavioral: Negative for suicidal ideas, hallucinations, behavioral problems, confusion, sleep disturbance, dysphoric mood and decreased concentration. The patient is nervous/anxious. The patient is not hyperactive.        Objective:   Physical Exam  Constitutional: She appears well-developed. No distress.  Obese   HENT:  Head: Normocephalic.   Right Ear: External ear normal.  Left Ear: External ear normal.  Nose: Nose normal.  Mouth/Throat: Oropharynx is clear and moist.  Eyes: Conjunctivae are normal. Pupils are equal, round, and reactive to light. Right eye exhibits no discharge. Left eye exhibits no discharge.  Neck: Normal range of motion. Neck supple. No JVD present. No tracheal deviation present. No thyromegaly present.  Cardiovascular: Normal rate, regular rhythm and normal heart sounds.   Pulmonary/Chest: No stridor. No respiratory distress. She has no wheezes.  Abdominal: Soft. Bowel sounds are normal. She exhibits no distension and no mass. There is no tenderness. There is no rebound and no guarding.  Musculoskeletal: She exhibits no edema and no tenderness.  Lymphadenopathy:    She has no cervical adenopathy.  Neurological: She displays normal reflexes. No cranial nerve deficit. She exhibits normal muscle tone. Coordination normal.  Skin: No rash noted. No erythema.  Psychiatric: She has a normal mood and affect. Her behavior is normal. Judgment and thought content normal.    Lab Results  Component Value Date   WBC 4.7 01/03/2012   HGB 12.1 01/03/2012   HCT 37.1 01/03/2012   PLT 209.0 01/03/2012   GLUCOSE 95 01/03/2012   CHOL 190 01/03/2012   TRIG 116.0 01/03/2012   HDL 41.00 01/03/2012   LDLDIRECT 149.1 12/11/2006   LDLCALC 126* 01/03/2012   ALT 15 01/03/2012   AST 18 01/03/2012   NA 141 01/03/2012   K 3.7 01/03/2012   CL 103 01/03/2012   CREATININE 0.9 01/03/2012   BUN 13 01/03/2012   CO2 30 01/03/2012   TSH 2.25 01/03/2012         Assessment & Plan:

## 2014-01-22 ENCOUNTER — Other Ambulatory Visit (INDEPENDENT_AMBULATORY_CARE_PROVIDER_SITE_OTHER): Payer: PRIVATE HEALTH INSURANCE

## 2014-01-22 DIAGNOSIS — R7309 Other abnormal glucose: Secondary | ICD-10-CM

## 2014-01-22 DIAGNOSIS — Z0000 Encounter for general adult medical examination without abnormal findings: Secondary | ICD-10-CM

## 2014-01-22 DIAGNOSIS — R739 Hyperglycemia, unspecified: Secondary | ICD-10-CM

## 2014-01-22 LAB — CBC WITH DIFFERENTIAL/PLATELET
BASOS ABS: 0.1 10*3/uL (ref 0.0–0.1)
Basophils Relative: 1.1 % (ref 0.0–3.0)
EOS PCT: 3.2 % (ref 0.0–5.0)
Eosinophils Absolute: 0.2 10*3/uL (ref 0.0–0.7)
HCT: 36.1 % (ref 36.0–46.0)
HEMOGLOBIN: 12.1 g/dL (ref 12.0–15.0)
Lymphocytes Relative: 37.1 % (ref 12.0–46.0)
Lymphs Abs: 1.8 10*3/uL (ref 0.7–4.0)
MCHC: 33.5 g/dL (ref 30.0–36.0)
MCV: 82.9 fl (ref 78.0–100.0)
MONO ABS: 0.3 10*3/uL (ref 0.1–1.0)
Monocytes Relative: 7 % (ref 3.0–12.0)
Neutro Abs: 2.5 10*3/uL (ref 1.4–7.7)
Neutrophils Relative %: 51.6 % (ref 43.0–77.0)
PLATELETS: 183 10*3/uL (ref 150.0–400.0)
RBC: 4.35 Mil/uL (ref 3.87–5.11)
RDW: 15.3 % (ref 11.5–15.5)
WBC: 4.8 10*3/uL (ref 4.0–10.5)

## 2014-01-22 LAB — LIPID PANEL
CHOLESTEROL: 195 mg/dL (ref 0–200)
HDL: 40.4 mg/dL (ref >39.00)
LDL CALC: 136 mg/dL — AB (ref 0–99)
NonHDL: 154.6
Total CHOL/HDL Ratio: 5
Triglycerides: 93 mg/dL (ref 0.0–149.0)
VLDL: 18.6 mg/dL (ref 0.0–40.0)

## 2014-01-22 LAB — TSH: TSH: 2.85 u[IU]/mL (ref 0.35–4.50)

## 2014-01-22 LAB — BASIC METABOLIC PANEL
BUN: 18 mg/dL (ref 6–23)
CHLORIDE: 101 meq/L (ref 96–112)
CO2: 28 mEq/L (ref 19–32)
Calcium: 8.7 mg/dL (ref 8.4–10.5)
Creatinine, Ser: 1 mg/dL (ref 0.4–1.2)
GFR: 73.66 mL/min (ref >60.00)
Glucose, Bld: 88 mg/dL (ref 70–99)
POTASSIUM: 3.3 meq/L — AB (ref 3.5–5.1)
SODIUM: 138 meq/L (ref 135–145)

## 2014-01-22 LAB — URINALYSIS
BILIRUBIN URINE: NEGATIVE
Hgb urine dipstick: NEGATIVE
KETONES UR: NEGATIVE
LEUKOCYTES UA: NEGATIVE
NITRITE: NEGATIVE
PH: 6 (ref 5.0–8.0)
Specific Gravity, Urine: 1.025 (ref 1.000–1.030)
Total Protein, Urine: NEGATIVE
UROBILINOGEN UA: 0.2 (ref 0.0–1.0)
Urine Glucose: NEGATIVE

## 2014-01-22 LAB — HEPATIC FUNCTION PANEL
ALBUMIN: 3.9 g/dL (ref 3.5–5.2)
ALT: 13 U/L (ref 0–35)
AST: 16 U/L (ref 0–37)
Alkaline Phosphatase: 50 U/L (ref 39–117)
Bilirubin, Direct: 0.1 mg/dL (ref 0.0–0.3)
TOTAL PROTEIN: 6.8 g/dL (ref 6.0–8.3)
Total Bilirubin: 0.8 mg/dL (ref 0.2–1.2)

## 2014-01-22 LAB — HEMOGLOBIN A1C: Hgb A1c MFr Bld: 6.4 % (ref 4.6–6.5)

## 2014-08-11 ENCOUNTER — Other Ambulatory Visit: Payer: Self-pay | Admitting: Internal Medicine

## 2014-08-11 NOTE — Telephone Encounter (Signed)
MD is out of office. Is this ok to refill...Jennifer Quinn

## 2014-08-11 NOTE — Telephone Encounter (Signed)
Faxed script to [facility].../lmb 

## 2014-08-11 NOTE — Telephone Encounter (Signed)
Pt called stated that she is out of this med, please help

## 2014-08-19 ENCOUNTER — Other Ambulatory Visit: Payer: Self-pay

## 2014-08-19 MED ORDER — LORCASERIN HCL 10 MG PO TABS: 1.0000 | ORAL_TABLET | Freq: Two times a day (BID) | ORAL | Status: DC

## 2014-11-03 ENCOUNTER — Other Ambulatory Visit: Payer: Self-pay

## 2014-11-03 DIAGNOSIS — Z1231 Encounter for screening mammogram for malignant neoplasm of breast: Secondary | ICD-10-CM

## 2014-12-11 ENCOUNTER — Ambulatory Visit: Payer: PRIVATE HEALTH INSURANCE

## 2014-12-16 ENCOUNTER — Other Ambulatory Visit: Payer: Self-pay | Admitting: Internal Medicine

## 2014-12-18 ENCOUNTER — Ambulatory Visit
Admission: RE | Admit: 2014-12-18 | Discharge: 2014-12-18 | Disposition: A | Discharge status: Home / Self Care | Payer: PRIVATE HEALTH INSURANCE | Source: Ambulatory Visit

## 2014-12-18 DIAGNOSIS — Z1231 Encounter for screening mammogram for malignant neoplasm of breast: Secondary | ICD-10-CM

## 2015-01-02 MED ORDER — LORCASERIN HCL 10 MG PO TABS: 1.0000 | ORAL_TABLET | Freq: Two times a day (BID) | ORAL | Status: DC

## 2015-01-02 NOTE — Addendum Note (Signed)
Addended by: Cresenciano Lick on: 01/02/2015 08:30 AM   Modules accepted: Orders

## 2015-01-02 NOTE — Telephone Encounter (Signed)
Done

## 2015-01-22 ENCOUNTER — Other Ambulatory Visit: Payer: Self-pay | Admitting: Internal Medicine

## 2015-02-19 ENCOUNTER — Other Ambulatory Visit: Payer: Self-pay | Admitting: Internal Medicine

## 2015-03-11 ENCOUNTER — Encounter: Payer: Self-pay | Admitting: Internal Medicine

## 2015-05-05 ENCOUNTER — Other Ambulatory Visit: Payer: Self-pay | Admitting: Internal Medicine

## 2015-05-15 ENCOUNTER — Ambulatory Visit: Payer: PRIVATE HEALTH INSURANCE | Admitting: Internal Medicine

## 2015-05-22 ENCOUNTER — Ambulatory Visit (INDEPENDENT_AMBULATORY_CARE_PROVIDER_SITE_OTHER): Payer: PRIVATE HEALTH INSURANCE | Admitting: Internal Medicine

## 2015-05-22 ENCOUNTER — Encounter: Payer: Self-pay | Admitting: Internal Medicine

## 2015-05-22 ENCOUNTER — Other Ambulatory Visit (INDEPENDENT_AMBULATORY_CARE_PROVIDER_SITE_OTHER): Payer: PRIVATE HEALTH INSURANCE

## 2015-05-22 VITALS — BP 110/70 | HR 66 | Wt 240.0 lb

## 2015-05-22 DIAGNOSIS — K219 Gastro-esophageal reflux disease without esophagitis: Secondary | ICD-10-CM

## 2015-05-22 DIAGNOSIS — R0789 Other chest pain: Secondary | ICD-10-CM

## 2015-05-22 LAB — LIPID PANEL
CHOL/HDL RATIO: 5
Cholesterol: 185 mg/dL (ref 0–200)
HDL: 34.7 mg/dL — AB (ref >39.00)
LDL Cholesterol: 126 mg/dL — ABNORMAL HIGH (ref 0–99)
NONHDL: 149.88
Triglycerides: 119 mg/dL (ref 0.0–149.0)
VLDL: 23.8 mg/dL (ref 0.0–40.0)

## 2015-05-22 LAB — HEPATIC FUNCTION PANEL
ALBUMIN: 3.9 g/dL (ref 3.5–5.2)
ALK PHOS: 50 U/L (ref 39–117)
ALT: 8 U/L (ref 0–35)
AST: 12 U/L (ref 0–37)
BILIRUBIN DIRECT: 0.1 mg/dL (ref 0.0–0.3)
TOTAL PROTEIN: 6.7 g/dL (ref 6.0–8.3)
Total Bilirubin: 0.5 mg/dL (ref 0.2–1.2)

## 2015-05-22 LAB — BASIC METABOLIC PANEL
BUN: 15 mg/dL (ref 6–23)
CHLORIDE: 104 meq/L (ref 96–112)
CO2: 31 meq/L (ref 19–32)
CREATININE: 0.94 mg/dL (ref 0.40–1.20)
Calcium: 8.9 mg/dL (ref 8.4–10.5)
GFR: 77.85 mL/min (ref >60.00)
GLUCOSE: 94 mg/dL (ref 70–99)
Potassium: 3.9 mEq/L (ref 3.5–5.1)
SODIUM: 141 meq/L (ref 135–145)

## 2015-05-22 LAB — CBC WITH DIFFERENTIAL/PLATELET
BASOS ABS: 0 10*3/uL (ref 0.0–0.1)
Basophils Relative: 0.7 % (ref 0.0–3.0)
Eosinophils Absolute: 0.1 10*3/uL (ref 0.0–0.7)
Eosinophils Relative: 3.2 % (ref 0.0–5.0)
HCT: 38.8 % (ref 36.0–46.0)
HEMOGLOBIN: 12.8 g/dL (ref 12.0–15.0)
LYMPHS ABS: 1.5 10*3/uL (ref 0.7–4.0)
Lymphocytes Relative: 37.5 % (ref 12.0–46.0)
MCHC: 33 g/dL (ref 30.0–36.0)
MCV: 82.7 fl (ref 78.0–100.0)
MONOS PCT: 7.1 % (ref 3.0–12.0)
Monocytes Absolute: 0.3 10*3/uL (ref 0.1–1.0)
NEUTROS PCT: 51.5 % (ref 43.0–77.0)
Neutro Abs: 2 10*3/uL (ref 1.4–7.7)
Platelets: 213 10*3/uL (ref 150.0–400.0)
RBC: 4.69 Mil/uL (ref 3.87–5.11)
RDW: 15 % (ref 11.5–15.5)
WBC: 3.9 10*3/uL — AB (ref 4.0–10.5)

## 2015-05-22 LAB — URINALYSIS
Bilirubin Urine: NEGATIVE
Hgb urine dipstick: NEGATIVE
Ketones, ur: NEGATIVE
LEUKOCYTES UA: NEGATIVE
Nitrite: NEGATIVE
SPECIFIC GRAVITY, URINE: 1.015 (ref 1.000–1.030)
Total Protein, Urine: NEGATIVE
UROBILINOGEN UA: 0.2 (ref 0.0–1.0)
Urine Glucose: NEGATIVE
pH: 8 (ref 5.0–8.0)

## 2015-05-22 LAB — H. PYLORI ANTIBODY, IGG: H PYLORI IGG: NEGATIVE

## 2015-05-22 LAB — TSH: TSH: 1.53 u[IU]/mL (ref 0.35–4.50)

## 2015-05-22 LAB — HEMOGLOBIN A1C: Hgb A1c MFr Bld: 6.1 % (ref 4.6–6.5)

## 2015-05-22 MED ORDER — HYDROCHLOROTHIAZIDE 12.5 MG PO CAPS: 12.5000 mg | ORAL_CAPSULE | Freq: Two times a day (BID) | ORAL | Status: DC

## 2015-05-22 MED ORDER — LORCASERIN HCL 10 MG PO TABS: 1.0000 | ORAL_TABLET | Freq: Two times a day (BID) | ORAL | Status: DC

## 2015-05-22 MED ORDER — POTASSIUM CHLORIDE CRYS ER 20 MEQ PO TBCR: 20.0000 meq | EXTENDED_RELEASE_TABLET | Freq: Every day | ORAL | Status: DC

## 2015-05-22 MED ORDER — DEXLANSOPRAZOLE 30 MG PO CPDR: 30.0000 mg | DELAYED_RELEASE_CAPSULE | Freq: Every day | ORAL | Status: DC

## 2015-05-22 NOTE — Assessment & Plan Note (Signed)
9/16 causing CP Start Dexilant

## 2015-05-22 NOTE — Progress Notes (Signed)
Pre visit review using our clinic review tool, if applicable. No additional management support is needed unless otherwise documented below in the visit note. 

## 2015-05-22 NOTE — Progress Notes (Signed)
Subjective:  Patient ID: Jennifer Quinn, female    DOB: Oct 21, 1955  Age: 59 y.o. MRN: [id number]  CC: No chief complaint on file.   Chest Pain  This is a new problem. The current episode started more than 1 month ago. The onset quality is undetermined. The problem occurs every several days. The problem has been waxing and waning. The pain is mild. The quality of the pain is described as burning and dull. The pain does not radiate. Pertinent negatives include no abdominal pain, diaphoresis, palpitations, shortness of breath, vomiting or weakness. Associated symptoms comments: GERD, worse after eating. The pain is aggravated by food. She has tried antacids for the symptoms.  Her past medical history is significant for hypertension.  Pertinent negatives for family medical history include: no CAD.  Prilosec helped Jennifer Quinn presents for CP. F/u HTN. Pt has been eating out a lot - fried food  Outpatient Prescriptions Prior to Visit  Medication Sig Dispense Refill  . BLACK COHOSH PO Take 1 tablet by mouth daily.      . Fish Oil OIL Take 2,000 mg by mouth daily.      . Ginkgo Biloba 40 MG TABS Take 2 tablets by mouth daily.      . polyethylene glycol powder (GLYCOLAX/MIRALAX) powder MIX 17 GRAMS IN LIQUID OF CHOICE AND DRINK ONCE DAILY AS NEEDED 527 g 3  . hydrochlorothiazide (MICROZIDE) 12.5 MG capsule TAKE 1 CAPSULE BY MOUTH TWICE DAILY 60 capsule 0  . Lorcaserin HCl (BELVIQ) 10 MG TABS Take 1 tablet by mouth 2 (two) times daily. 60 tablet 2  . potassium chloride SA (K-DUR,KLOR-CON) 20 MEQ tablet TAKE 1 TABLET BY MOUTH ONCE DAILY 30 tablet 0  . cholecalciferol (VITAMIN D) 1000 UNITS tablet Take 1 tablet (1,000 Units total) by mouth daily. 100 tablet 3  . loratadine (CLARITIN) 10 MG tablet Take 1 tablet (10 mg total) by mouth daily as needed for allergies. 100 tablet 3   Facility-Administered Medications Prior to Visit  Medication Dose Route Frequency Luis Sami Last Rate Last Dose  . 0.9 %   sodium chloride infusion  500 mL Intravenous Continuous Gatha Mayer, MD        ROS Review of Systems  Constitutional: Negative for chills and diaphoresis.  Respiratory: Positive for chest tightness. Negative for shortness of breath.   Cardiovascular: Positive for chest pain. Negative for palpitations and leg swelling.  Gastrointestinal: Negative for vomiting, abdominal pain and constipation.  Musculoskeletal: Negative for neck pain.  Skin: Negative for rash.  Neurological: Negative for weakness.  Psychiatric/Behavioral: Negative for sleep disturbance. The patient is not nervous/anxious.     Objective:  BP 110/70 mmHg  Pulse 66  Wt 240 lb (108.863 kg)  SpO2 94%  BP Readings from Last 3 Encounters:  05/22/15 110/70  01/20/14 120/76  11/26/12 120/62    Wt Readings from Last 3 Encounters:  05/22/15 240 lb (108.863 kg)  01/20/14 248 lb (112.492 kg)  11/26/12 234 lb (106.142 kg)    Physical Exam  Constitutional: She appears well-developed. No distress.  HENT:  Head: Normocephalic.  Right Ear: External ear normal.  Left Ear: External ear normal.  Nose: Nose normal.  Mouth/Throat: Oropharynx is clear and moist.  Eyes: Conjunctivae are normal. Pupils are equal, round, and reactive to light. Right eye exhibits no discharge. Left eye exhibits no discharge.  Neck: Normal range of motion. Neck supple. No JVD present. No tracheal deviation present. No thyromegaly present.  Cardiovascular: Normal rate,  regular rhythm and normal heart sounds.   No murmur heard. Pulmonary/Chest: No stridor. No respiratory distress. She has no wheezes. She has no rales.  Abdominal: Soft. Bowel sounds are normal. She exhibits no distension and no mass. There is no tenderness. There is no rebound and no guarding.  Musculoskeletal: She exhibits no edema or tenderness.  Lymphadenopathy:    She has no cervical adenopathy.  Neurological: She displays normal reflexes. No cranial nerve deficit. She  exhibits normal muscle tone. Coordination normal.  Skin: No rash noted. No erythema.  Psychiatric: She has a normal mood and affect. Her behavior is normal. Judgment and thought content normal.  Obese EKG S brady   Lab Results  Component Value Date   WBC 4.8 01/22/2014   HGB 12.1 01/22/2014   HCT 36.1 01/22/2014   PLT 183.0 01/22/2014   GLUCOSE 88 01/22/2014   CHOL 195 01/22/2014   TRIG 93.0 01/22/2014   HDL 40.40 01/22/2014   LDLDIRECT 149.1 12/11/2006   LDLCALC 136* 01/22/2014   ALT 13 01/22/2014   AST 16 01/22/2014   NA 138 01/22/2014   K 3.3* 01/22/2014   CL 101 01/22/2014   CREATININE 1.0 01/22/2014   BUN 18 01/22/2014   CO2 28 01/22/2014   TSH 2.85 01/22/2014   HGBA1C 6.4 01/22/2014    Mm Digital Screening Bilateral  12/19/2014   CLINICAL DATA:  Screening.  EXAM: DIGITAL SCREENING BILATERAL MAMMOGRAM WITH CAD  COMPARISON:  Previous exam(s).  ACR Breast Density Category b: There are scattered areas of fibroglandular density.  FINDINGS: There are no findings suspicious for malignancy. Images were processed with CAD.  IMPRESSION: No mammographic evidence of malignancy. A result letter of this screening mammogram will be mailed directly to the patient.  RECOMMENDATION: Screening mammogram in one year. (Code:SM-B-01Y)  BI-RADS CATEGORY  1: Negative.   Electronically Signed   By: Lajean Manes M.D.   On: 12/19/2014 11:07    Assessment & Plan:   Diagnoses and all orders for this visit:  Chest pain, atypical -     Hemoglobin A1c; Future -     Hepatic function panel; Future -     Lipid panel; Future -     CBC with Differential/Platelet; Future -     Basic metabolic panel; Future -     Urinalysis; Future -     TSH; Future -     H. pylori antibody, IgG; Future  Other chest pain -     EKG 12-Lead -     Hemoglobin A1c; Future -     Hepatic function panel; Future -     Lipid panel; Future -     CBC with Differential/Platelet; Future -     Basic metabolic panel; Future -      Urinalysis; Future -     TSH; Future -     H. pylori antibody, IgG; Future  Gastroesophageal reflux disease without esophagitis -     Hemoglobin A1c; Future -     Hepatic function panel; Future -     Lipid panel; Future -     CBC with Differential/Platelet; Future -     Basic metabolic panel; Future -     Urinalysis; Future -     TSH; Future -     H. pylori antibody, IgG; Future  OBESITY, MORBID -     Hemoglobin A1c; Future -     Hepatic function panel; Future -     Lipid panel; Future -  CBC with Differential/Platelet; Future -     Basic metabolic panel; Future -     Urinalysis; Future -     TSH; Future -     H. pylori antibody, IgG; Future  Other orders -     Lorcaserin HCl (BELVIQ) 10 MG TABS; Take 1 tablet by mouth 2 (two) times daily. -     Dexlansoprazole (DEXILANT) 30 MG capsule; Take 1 capsule (30 mg total) by mouth daily. -     hydrochlorothiazide (MICROZIDE) 12.5 MG capsule; Take 1 capsule (12.5 mg total) by mouth 2 (two) times daily. -     potassium chloride SA (K-DUR,KLOR-CON) 20 MEQ tablet; Take 1 tablet (20 mEq total) by mouth daily.   I have changed Ms. Barrick's hydrochlorothiazide and potassium chloride SA. I am also having her start on Dexlansoprazole. Additionally, I am having her maintain her Fish Oil, Ginkgo Biloba, BLACK COHOSH PO, cholecalciferol, loratadine, polyethylene glycol powder, and Lorcaserin HCl. We will continue to administer sodium chloride.  Meds ordered this encounter  Medications  . Lorcaserin HCl (BELVIQ) 10 MG TABS    Sig: Take 1 tablet by mouth 2 (two) times daily.    Dispense:  60 tablet    Refill:  2  . Dexlansoprazole (DEXILANT) 30 MG capsule    Sig: Take 1 capsule (30 mg total) by mouth daily.    Dispense:  90 capsule    Refill:  3  . hydrochlorothiazide (MICROZIDE) 12.5 MG capsule    Sig: Take 1 capsule (12.5 mg total) by mouth 2 (two) times daily.    Dispense:  60 capsule    Refill:  0    Needs office visit for further  refills.  . potassium chloride SA (K-DUR,KLOR-CON) 20 MEQ tablet    Sig: Take 1 tablet (20 mEq total) by mouth daily.    Dispense:  30 tablet    Refill:  0     Follow-up: Return in about 3 months (around 08/21/2015) for a follow-up visit.  Walker Kehr, MD

## 2015-05-22 NOTE — Assessment & Plan Note (Signed)
Ok to [employer]  Potential benefits of a long term Belviq  use as well as potential risks  and complications were explained to the patient and were aknowledged.

## 2015-05-22 NOTE — Assessment & Plan Note (Signed)
9/16 ?GERD causing CP Start Dexilant EKG OK

## 2015-07-02 ENCOUNTER — Other Ambulatory Visit: Payer: Self-pay | Admitting: Internal Medicine

## 2015-07-02 ENCOUNTER — Encounter: Payer: Self-pay | Admitting: Internal Medicine

## 2015-07-03 ENCOUNTER — Other Ambulatory Visit: Payer: Self-pay | Admitting: *Deleted

## 2015-07-03 MED ORDER — DEXLANSOPRAZOLE 30 MG PO CPDR: 30.0000 mg | DELAYED_RELEASE_CAPSULE | Freq: Every day | ORAL | Status: DC

## 2015-08-21 ENCOUNTER — Ambulatory Visit: Payer: PRIVATE HEALTH INSURANCE | Admitting: Internal Medicine

## 2015-09-11 ENCOUNTER — Telehealth: Payer: Self-pay | Admitting: *Deleted

## 2015-09-11 NOTE — Telephone Encounter (Signed)
Dexilant PA initiated via CoverMyMeds.

## 2015-09-14 NOTE — Telephone Encounter (Signed)
Received fax: PA has been approved until 09/10/2016.   Faxed information to pharmacy.

## 2015-09-14 NOTE — Telephone Encounter (Signed)
Pt informed

## 2015-09-23 ENCOUNTER — Ambulatory Visit (INDEPENDENT_AMBULATORY_CARE_PROVIDER_SITE_OTHER): Payer: PRIVATE HEALTH INSURANCE | Admitting: Internal Medicine

## 2015-09-23 ENCOUNTER — Encounter: Payer: Self-pay | Admitting: Internal Medicine

## 2015-09-23 VITALS — BP 124/70 | HR 80 | Wt 237.0 lb

## 2015-09-23 DIAGNOSIS — N6489 Other specified disorders of breast: Secondary | ICD-10-CM

## 2015-09-23 DIAGNOSIS — N649 Disorder of breast, unspecified: Secondary | ICD-10-CM | POA: Insufficient documentation

## 2015-09-23 NOTE — Progress Notes (Signed)
Pre visit review using our clinic review tool, if applicable. No additional management support is needed unless otherwise documented below in the visit note. 

## 2015-09-23 NOTE — Progress Notes (Signed)
Subjective:  Patient ID: Jennifer Quinn, female    DOB: [birth year]  Age: 60 y.o. MRN: [id number]  CC: No chief complaint on file.   HPI AMOR CAPRETTA presents for R nipple discoloration spot x 3 d, NT  Outpatient Prescriptions Prior to Visit  Medication Sig Dispense Refill  . BLACK COHOSH PO Take 1 tablet by mouth daily.      Marland Kitchen Dexlansoprazole (DEXILANT) 30 MG capsule Take 1 capsule (30 mg total) by mouth daily. 90 capsule 3  . Fish Oil OIL Take 2,000 mg by mouth daily.      . Ginkgo Biloba 40 MG TABS Take 2 tablets by mouth daily.      . hydrochlorothiazide (MICROZIDE) 12.5 MG capsule TAKE ONE CAPSULE BY MOUTH TWICE A DAY 60 capsule 1  . Lorcaserin HCl (BELVIQ) 10 MG TABS Take 1 tablet by mouth 2 (two) times daily. 60 tablet 2  . polyethylene glycol powder (GLYCOLAX/MIRALAX) powder MIX 17 GRAMS IN LIQUID OF CHOICE AND DRINK ONCE DAILY AS NEEDED 527 g 3  . potassium chloride SA (K-DUR,KLOR-CON) 20 MEQ tablet Take 1 tablet (20 mEq total) by mouth daily. 30 tablet 0  . cholecalciferol (VITAMIN D) 1000 UNITS tablet Take 1 tablet (1,000 Units total) by mouth daily. 100 tablet 3  . loratadine (CLARITIN) 10 MG tablet Take 1 tablet (10 mg total) by mouth daily as needed for allergies. 100 tablet 3   Facility-Administered Medications Prior to Visit  Medication Dose Route Frequency Provider Last Rate Last Dose  . 0.9 %  sodium chloride infusion  500 mL Intravenous Continuous Gatha Mayer, MD        ROS Review of Systems  Constitutional: Negative for activity change and unexpected weight change.  Cardiovascular: Negative for chest pain.  Skin: Negative for color change, rash and wound.    Objective:  BP 124/70 mmHg  Pulse 80  Wt 237 lb (107.502 kg)  SpO2 95%  BP Readings from Last 3 Encounters:  09/23/15 124/70  05/22/15 110/70  01/20/14 120/76    Wt Readings from Last 3 Encounters:  09/23/15 237 lb (107.502 kg)  05/22/15 240 lb (108.863 kg)  01/20/14 248 lb (112.492 kg)      Physical Exam  Constitutional: No distress.  Pulmonary/Chest: She exhibits no tenderness.  B breasts WNL; B axillae WNL R nipple ivory 1 mm clogged small nipple pore  No d/c  Lab Results  Component Value Date   WBC 3.9* 05/22/2015   HGB 12.8 05/22/2015   HCT 38.8 05/22/2015   PLT 213.0 05/22/2015   GLUCOSE 94 05/22/2015   CHOL 185 05/22/2015   TRIG 119.0 05/22/2015   HDL 34.70* 05/22/2015   LDLDIRECT 149.1 12/11/2006   LDLCALC 126* 05/22/2015   ALT 8 05/22/2015   AST 12 05/22/2015   NA 141 05/22/2015   K 3.9 05/22/2015   CL 104 05/22/2015   CREATININE 0.94 05/22/2015   BUN 15 05/22/2015   CO2 31 05/22/2015   TSH 1.53 05/22/2015   HGBA1C 6.1 05/22/2015    Mm Digital Screening Bilateral  12/19/2014  CLINICAL DATA:  Screening. EXAM: DIGITAL SCREENING BILATERAL MAMMOGRAM WITH CAD COMPARISON:  Previous exam(s). ACR Breast Density Category b: There are scattered areas of fibroglandular density. FINDINGS: There are no findings suspicious for malignancy. Images were processed with CAD. IMPRESSION: No mammographic evidence of malignancy. A result letter of this screening mammogram will be mailed directly to the patient. RECOMMENDATION: Screening mammogram in one year. (Code:SM-B-01Y) BI-RADS CATEGORY  1: Negative. Electronically Signed   By: Lajean Manes M.D.   On: 12/19/2014 11:07    Assessment & Plan:   There are no diagnoses linked to this encounter. I am having Ms. Stagliano maintain her Fish Oil, Ginkgo Biloba, BLACK COHOSH PO, cholecalciferol, loratadine, polyethylene glycol powder, Lorcaserin HCl, potassium chloride SA, hydrochlorothiazide, Dexlansoprazole, and PROAIR HFA. We will continue to administer sodium chloride.  Meds ordered this encounter  Medications  . PROAIR HFA 108 (90 Base) MCG/ACT inhaler    Sig: Inhale 1-2 puffs into the lungs every 6 (six) hours as needed.    Refill:  0     Follow-up: No Follow-up on file.  Walker Kehr, MD

## 2015-09-23 NOTE — Assessment & Plan Note (Addendum)
1/17 R  - new - ?clogged small nipple pore  Nl exam Diagnostic mammogram

## 2015-09-24 ENCOUNTER — Other Ambulatory Visit: Payer: Self-pay | Admitting: Internal Medicine

## 2015-09-24 DIAGNOSIS — N649 Disorder of breast, unspecified: Secondary | ICD-10-CM

## 2015-10-26 ENCOUNTER — Encounter: Payer: Self-pay | Admitting: Internal Medicine

## 2015-11-18 ENCOUNTER — Other Ambulatory Visit: Payer: Self-pay | Admitting: Internal Medicine

## 2015-11-20 ENCOUNTER — Other Ambulatory Visit: Payer: PRIVATE HEALTH INSURANCE

## 2015-11-20 ENCOUNTER — Other Ambulatory Visit: Payer: Self-pay | Admitting: Internal Medicine

## 2015-12-03 ENCOUNTER — Ambulatory Visit
Admission: RE | Admit: 2015-12-03 | Discharge: 2015-12-03 | Disposition: A | Discharge status: Home / Self Care | Payer: PRIVATE HEALTH INSURANCE | Source: Ambulatory Visit | Attending: Internal Medicine | Admitting: Internal Medicine

## 2015-12-03 ENCOUNTER — Ambulatory Visit: Admission: RE | Admit: 2015-12-03 | Payer: PRIVATE HEALTH INSURANCE | Source: Ambulatory Visit

## 2015-12-03 DIAGNOSIS — N649 Disorder of breast, unspecified: Secondary | ICD-10-CM

## 2016-03-15 ENCOUNTER — Encounter: Payer: Self-pay | Admitting: Internal Medicine

## 2016-03-24 ENCOUNTER — Encounter: Payer: Self-pay | Admitting: Internal Medicine

## 2016-03-31 ENCOUNTER — Ambulatory Visit: Payer: PRIVATE HEALTH INSURANCE | Admitting: Internal Medicine

## 2016-04-04 ENCOUNTER — Other Ambulatory Visit: Payer: Self-pay | Admitting: Internal Medicine

## 2016-04-04 ENCOUNTER — Ambulatory Visit
Admission: RE | Admit: 2016-04-04 | Discharge: 2016-04-04 | Disposition: A | Discharge status: Home / Self Care | Payer: PRIVATE HEALTH INSURANCE | Source: Ambulatory Visit | Attending: Internal Medicine | Admitting: Internal Medicine

## 2016-04-04 ENCOUNTER — Ambulatory Visit: Admission: RE | Admit: 2016-04-04 | Payer: PRIVATE HEALTH INSURANCE | Source: Ambulatory Visit

## 2016-04-04 DIAGNOSIS — N649 Disorder of breast, unspecified: Secondary | ICD-10-CM

## 2016-04-13 ENCOUNTER — Other Ambulatory Visit: Payer: Self-pay | Admitting: Internal Medicine

## 2016-04-14 NOTE — Telephone Encounter (Signed)
Needs OV.  

## 2016-04-17 ENCOUNTER — Other Ambulatory Visit: Payer: Self-pay | Admitting: Internal Medicine

## 2016-04-20 ENCOUNTER — Telehealth: Payer: Self-pay | Admitting: Internal Medicine

## 2016-04-20 ENCOUNTER — Telehealth: Payer: Self-pay | Admitting: Emergency Medicine

## 2016-04-20 ENCOUNTER — Other Ambulatory Visit: Payer: Self-pay | Admitting: Internal Medicine

## 2016-04-20 NOTE — Telephone Encounter (Signed)
email sent to billing to remove charge. Called no answer

## 2016-04-20 NOTE — Telephone Encounter (Signed)
Pt request NS fee remove for 03/31/16. Pt stating she didn't make that appt and never got a phone call infrom her of the appt. Please call her back

## 2016-04-20 NOTE — Telephone Encounter (Signed)
Pt is requesting a prescription refill on BELVIQ 10 MG TABS. Please advise thanks.

## 2016-04-21 NOTE — Telephone Encounter (Signed)
Rf phoned in. See meds.  

## 2016-04-21 NOTE — Telephone Encounter (Signed)
Done

## 2016-04-21 NOTE — Telephone Encounter (Signed)
OK to fill this prescription with additional refills xo Needs OV Thank you!

## 2016-05-11 ENCOUNTER — Ambulatory Visit (AMBULATORY_SURGERY_CENTER): Payer: Self-pay | Admitting: *Deleted

## 2016-05-11 VITALS — Ht 62.0 in | Wt 242.0 lb

## 2016-05-11 DIAGNOSIS — Z800 Family history of malignant neoplasm of digestive organs: Secondary | ICD-10-CM

## 2016-05-11 NOTE — Progress Notes (Signed)
No egg or soy allergy known to patient  No issues with past sedation with any surgeries  or procedures, no intubation problems  No diet pills per patient No home 02 use per patient  No blood thinners per patient  Pt states  issues with constipation and uses Miralax prn- when uses, stools are soft with no issues  No A fib or A flutter

## 2016-05-25 ENCOUNTER — Encounter: Payer: Self-pay | Admitting: Internal Medicine

## 2016-05-25 ENCOUNTER — Ambulatory Visit (AMBULATORY_SURGERY_CENTER): Payer: PRIVATE HEALTH INSURANCE | Admitting: Internal Medicine

## 2016-05-25 VITALS — BP 128/70 | HR 51 | Temp 97.8°F | Resp 21 | Ht 62.0 in | Wt 242.0 lb

## 2016-05-25 DIAGNOSIS — D128 Benign neoplasm of rectum: Secondary | ICD-10-CM

## 2016-05-25 DIAGNOSIS — Z1211 Encounter for screening for malignant neoplasm of colon: Secondary | ICD-10-CM | POA: Diagnosis not present

## 2016-05-25 DIAGNOSIS — K621 Rectal polyp: Secondary | ICD-10-CM | POA: Diagnosis not present

## 2016-05-25 DIAGNOSIS — Z800 Family history of malignant neoplasm of digestive organs: Secondary | ICD-10-CM | POA: Diagnosis not present

## 2016-05-25 MED ORDER — SODIUM CHLORIDE 0.9 % IV SOLN: 500.0000 mL | INTRAVENOUS | Status: DC

## 2016-05-25 NOTE — Progress Notes (Signed)
Called to room to assist during endoscopic procedure.  Patient ID and intended procedure confirmed with present staff. Received instructions for my participation in the procedure from the performing physician.  

## 2016-05-25 NOTE — Patient Instructions (Addendum)
   I found and removed one tiny polyp. I will let you know pathology results and when to have another routine colonoscopy by mail.  Should be 5 years again.  I appreciate the opportunity to care for you. Gatha Mayer, MD, [facility]  Discharge instructions given. Handout on polyps. Resume previous medications. YOU HAD AN ENDOSCOPIC PROCEDURE TODAY AT [facility]:   Refer to the procedure report that was given to you for any specific questions about what was found during the examination.  If the procedure report does not answer your questions, please call your gastroenterologist to clarify.  If you requested that your care partner not be given the details of your procedure findings, then the procedure report has been included in a sealed envelope for you to review at your convenience later.  YOU SHOULD EXPECT: Some feelings of bloating in the abdomen. Passage of more gas than usual.  Walking can help get rid of the air that was put into your GI tract during the procedure and reduce the bloating. If you had a lower endoscopy (such as a colonoscopy or flexible sigmoidoscopy) you may notice spotting of blood in your stool or on the toilet paper. If you underwent a bowel prep for your procedure, you may not have a normal bowel movement for a few days.  Please Note:  You might notice some irritation and congestion in your nose or some drainage.  This is from the oxygen used during your procedure.  There is no need for concern and it should clear up in a day or so.  SYMPTOMS TO REPORT IMMEDIATELY:   Following lower endoscopy (colonoscopy or flexible sigmoidoscopy):  Excessive amounts of blood in the stool  Significant tenderness or worsening of abdominal pains  Swelling of the abdomen that is new, acute  Fever of 100F or higher   For urgent or emergent issues, a gastroenterologist can be reached at any hour by calling [phone].   DIET:  We do recommend a small meal  at first, but then you may proceed to your regular diet.  Drink plenty of fluids but you should avoid alcoholic beverages for 24 hours.  ACTIVITY:  You should plan to take it easy for the rest of today and you should NOT DRIVE or use heavy machinery until tomorrow (because of the sedation medicines used during the test).    FOLLOW UP: Our staff will call the number listed on your records the next business day following your procedure to check on you and address any questions or concerns that you may have regarding the information given to you following your procedure. If we do not reach you, we will leave a message.  However, if you are feeling well and you are not experiencing any problems, there is no need to return our call.  We will assume that you have returned to your regular daily activities without incident.  If any biopsies were taken you will be contacted by phone or by letter within the next 1-3 weeks.  Please call us at [phone] if you have not heard about the biopsies in 3 weeks.    SIGNATURES/CONFIDENTIALITY: You and/or your care partner have signed paperwork which will be entered into your electronic medical record.  These signatures attest to the fact that that the information above on your After Visit Summary has been reviewed and is understood.  Full responsibility of the confidentiality of this discharge information lies with you and/or your care-partner.

## 2016-05-25 NOTE — Progress Notes (Signed)
Spontaneous respirations throughout. VSS. Resting comfortably. To PACU on room air. Report to  [employer].

## 2016-05-25 NOTE — Op Note (Signed)
[facility] Patient Name: Jennifer Quinn Procedure Date: 05/25/2016 9:13 AM MRN: [id number] Endoscopist: Gatha Mayer , MD Age: 60 Referring MD:  Date of Birth: February 18, 1956 Gender: Female Account #: [id number] Procedure:                Colonoscopy Indications:              Colon cancer screening in patient at increased                            risk: Colorectal cancer in mother Medicines:                Propofol per Anesthesia, Monitored Anesthesia Care Procedure:                Pre-Anesthesia Assessment:                           - Prior to the procedure, a History and Physical                            was performed, and patient medications and                            allergies were reviewed. The patient's tolerance of                            previous anesthesia was also reviewed. The risks                            and benefits of the procedure and the sedation                            options and risks were discussed with the patient.                            All questions were answered, and informed consent                            was obtained. Prior Anticoagulants: The patient has                            taken no previous anticoagulant or antiplatelet                            agents. ASA Grade Assessment: II - A patient with                            mild systemic disease. After reviewing the risks                            and benefits, the patient was deemed in                            satisfactory condition to undergo the procedure.  After obtaining informed consent, the colonoscope                            was passed under direct vision. Throughout the                            procedure, the patient's blood pressure, pulse, and                            oxygen saturations were monitored continuously. The                            Model CF-H180AL [id number]) scope was introduced   through the anus and advanced to the the cecum,                            identified by appendiceal orifice and ileocecal                            valve. The colonoscopy was performed without                            difficulty. The ileocecal valve, appendiceal                            orifice, and rectum were photographed. The quality                            of the bowel preparation was excellent. Scope In: 9:15:49 AM Scope Out: 9:30:47 AM Scope Withdrawal Time: 0 hours 12 minutes 18 seconds  Total Procedure Duration: 0 hours 14 minutes 58 seconds  Findings:                 The perianal and digital rectal examinations were                            normal.                           A 5 mm polyp was found in the rectum. The polyp was                            sessile. The polyp was removed with a cold snare.                            Resection and retrieval were complete. Verification                            of patient identification for the specimen was                            done. Estimated blood loss was minimal.                           The exam was otherwise  without abnormality on                            direct and retroflexion views. Complications:            No immediate complications. Estimated Blood Loss:     Estimated blood loss was minimal. Impression:               - One 5 mm polyp in the rectum, removed with a cold                            snare. Resected and retrieved.                           - The examination was otherwise normal on direct                            and retroflexion views.                           FAMILY HISTORY OF COLON CANCER MOTHER - 60's Recommendation:           - Patient has a contact number available for                            emergencies. The signs and symptoms of potential                            delayed complications were discussed with the                            patient. Return to normal activities  tomorrow.                            Written discharge instructions were provided to the                            patient.                           - Resume previous diet.                           - Continue present medications.                           - Repeat colonoscopy is recommended. The                            colonoscopy date will be determined after pathology                            results from today's exam become available for                            review. Gatha Mayer, MD 05/25/2016 9:37:28 AM This report has been signed electronically.

## 2016-05-26 ENCOUNTER — Telehealth: Payer: Self-pay | Admitting: *Deleted

## 2016-05-26 NOTE — Telephone Encounter (Signed)
  Follow up Call-  Call back number 05/25/2016  Post procedure Call Back phone  # [phone]  Permission to leave phone message Yes  Some recent data might be hidden     Patient questions:  Do you have a fever, pain , or abdominal swelling? No. Pain Score  0 *  Have you tolerated food without any problems? Yes.    Have you been able to return to your normal activities? Yes.    Do you have any questions about your discharge instructions: Diet   No. Medications  No. Follow up visit  No.  Do you have questions or concerns about your Care? No.  Actions: * If pain score is 4 or above: NO

## 2016-05-31 ENCOUNTER — Encounter: Payer: Self-pay | Admitting: Internal Medicine

## 2016-05-31 ENCOUNTER — Ambulatory Visit (INDEPENDENT_AMBULATORY_CARE_PROVIDER_SITE_OTHER): Payer: PRIVATE HEALTH INSURANCE | Admitting: Internal Medicine

## 2016-05-31 DIAGNOSIS — E559 Vitamin D deficiency, unspecified: Secondary | ICD-10-CM

## 2016-05-31 DIAGNOSIS — R600 Localized edema: Secondary | ICD-10-CM

## 2016-05-31 DIAGNOSIS — K219 Gastro-esophageal reflux disease without esophagitis: Secondary | ICD-10-CM | POA: Diagnosis not present

## 2016-05-31 MED ORDER — PROAIR HFA 108 (90 BASE) MCG/ACT IN AERS: 1.0000 | INHALATION_SPRAY | Freq: Four times a day (QID) | RESPIRATORY_TRACT | 5 refills | Status: DC | PRN

## 2016-05-31 MED ORDER — PHENTERMINE HCL 37.5 MG PO TABS: 37.5000 mg | ORAL_TABLET | Freq: Every day | ORAL | 2 refills | Status: DC

## 2016-05-31 NOTE — Progress Notes (Signed)
Pre visit review using our clinic review tool, if applicable. No additional management support is needed unless otherwise documented below in the visit note. 

## 2016-05-31 NOTE — Assessment & Plan Note (Signed)
Restart vit D

## 2016-05-31 NOTE — Progress Notes (Signed)
Subjective:  Patient ID: Jennifer Quinn, female    DOB: October 27, 1955  Age: 60 y.o. MRN: [id number]  CC: No chief complaint on file.   HPI MIAMI MERCHAN presents for asthma, obesity and wt/gain, GERD f/u  Outpatient Medications Prior to Visit  Medication Sig Dispense Refill  . BELVIQ 10 MG TABS TAKE 1 TABLET BY MOUTH TWICE A DAY 60 tablet 2  . bisacodyl (DULCOLAX) 5 MG EC tablet Take 5 mg by mouth once. For colon 10-4 x 4    . BLACK COHOSH PO Take 1 tablet by mouth daily.      Marland Kitchen Dexlansoprazole (DEXILANT) 30 MG capsule Take 1 capsule (30 mg total) by mouth daily. 90 capsule 3  . Fish Oil OIL Take 2,000 mg by mouth daily.      . hydrochlorothiazide (MICROZIDE) 12.5 MG capsule TAKE ONE CAPSULE BY MOUTH TWICE A DAY 60 capsule 1  . KLOR-CON M20 20 MEQ tablet TAKE 1 TABLET BY MOUTH EVERY DAY 30 tablet 11  . polyethylene glycol powder (GLYCOLAX/MIRALAX) powder MIX 17 GRAMS IN LIQUID OF CHOICE AND DRINK ONCE DAILY AS NEEDED 527 g 3  . PROAIR HFA 108 (90 Base) MCG/ACT inhaler Inhale 1-2 puffs into the lungs every 6 (six) hours as needed.  0  . cholecalciferol (VITAMIN D) 1000 UNITS tablet Take 1 tablet (1,000 Units total) by mouth daily. 100 tablet 3  . loratadine (CLARITIN) 10 MG tablet Take 1 tablet (10 mg total) by mouth daily as needed for allergies. (Patient not taking: Reported on 05/11/2016) 100 tablet 3   Facility-Administered Medications Prior to Visit  Medication Dose Route Frequency Provider Last Rate Last Dose  . 0.9 %  sodium chloride infusion  500 mL Intravenous Continuous Gatha Mayer, MD      . 0.9 %  sodium chloride infusion  500 mL Intravenous Continuous Gatha Mayer, MD        ROS Review of Systems  Constitutional: Positive for unexpected weight change. Negative for activity change, appetite change, chills and fatigue.  HENT: Negative for congestion, mouth sores and sinus pressure.   Eyes: Negative for visual disturbance.  Respiratory: Negative for cough and chest  tightness.   Gastrointestinal: Negative for abdominal pain and nausea.  Genitourinary: Negative for difficulty urinating, frequency and vaginal pain.  Musculoskeletal: Negative for back pain and gait problem.  Skin: Negative for pallor and rash.  Neurological: Negative for dizziness, tremors, weakness, numbness and headaches.  Psychiatric/Behavioral: Negative for confusion, sleep disturbance and suicidal ideas.    Objective:  BP 120/70   Pulse 76   Wt 245 lb (111.1 kg)   SpO2 95%   BMI 44.81 kg/m   BP Readings from Last 3 Encounters:  05/31/16 120/70  05/25/16 128/70  09/23/15 124/70    Wt Readings from Last 3 Encounters:  05/31/16 245 lb (111.1 kg)  05/25/16 242 lb (109.8 kg)  05/11/16 242 lb (109.8 kg)    Physical Exam  Constitutional: She appears well-developed. No distress.  HENT:  Head: Normocephalic.  Right Ear: External ear normal.  Left Ear: External ear normal.  Nose: Nose normal.  Mouth/Throat: Oropharynx is clear and moist.  Eyes: Conjunctivae are normal. Pupils are equal, round, and reactive to light. Right eye exhibits no discharge. Left eye exhibits no discharge.  Neck: Normal range of motion. Neck supple. No JVD present. No tracheal deviation present. No thyromegaly present.  Cardiovascular: Normal rate, regular rhythm and normal heart sounds.   Pulmonary/Chest: No stridor. No  respiratory distress. She has no wheezes.  Abdominal: Soft. Bowel sounds are normal. She exhibits no distension and no mass. There is no tenderness. There is no rebound and no guarding.  Musculoskeletal: She exhibits no edema or tenderness.  Lymphadenopathy:    She has no cervical adenopathy.  Neurological: She displays normal reflexes. No cranial nerve deficit. She exhibits normal muscle tone. Coordination normal.  Skin: No rash noted. No erythema.  Psychiatric: She has a normal mood and affect. Her behavior is normal. Judgment and thought content normal.  Obese  Lab Results    Component Value Date   WBC 3.9 (L) 05/22/2015   HGB 12.8 05/22/2015   HCT 38.8 05/22/2015   PLT 213.0 05/22/2015   GLUCOSE 94 05/22/2015   CHOL 185 05/22/2015   TRIG 119.0 05/22/2015   HDL 34.70 (L) 05/22/2015   LDLDIRECT 149.1 12/11/2006   LDLCALC 126 (H) 05/22/2015   ALT 8 05/22/2015   AST 12 05/22/2015   NA 141 05/22/2015   K 3.9 05/22/2015   CL 104 05/22/2015   CREATININE 0.94 05/22/2015   BUN 15 05/22/2015   CO2 31 05/22/2015   TSH 1.53 05/22/2015   HGBA1C 6.1 05/22/2015    Mm Diag Breast Tomo Bilateral  Result Date: 04/04/2016 CLINICAL DATA:  Patient noted an area discoloration on her right nipple several weeks ago. This has since resolved. EXAM: 2D DIGITAL DIAGNOSTIC BILATERAL MAMMOGRAM WITH CAD AND ADJUNCT TOMO COMPARISON:  12/18/2014 and previous studies ACR Breast Density Category b: There are scattered areas of fibroglandular density. FINDINGS: There is no worrisome mass, distortion, or worrisome calcification within either breast. The parenchymal pattern is stable. On my directed physical examination of the right nipple region there is no ulceration, discoloration, or crusting. Mammographic images were processed with CAD. IMPRESSION: No findings worrisome for malignancy. Recommend screening mammography 1 year. RECOMMENDATION: Screening mammography 1 year. I have discussed the findings and recommendations with the patient. Results were also provided in writing at the conclusion of the visit. If applicable, a reminder letter will be sent to the patient regarding the next appointment. BI-RADS CATEGORY  1: Negative. Electronically Signed   By: Altamese Cabal M.D.   On: 04/04/2016 09:42    Assessment & Plan:   There are no diagnoses linked to this encounter. I am having Ms. Henneman maintain her Fish Oil, BLACK COHOSH PO, cholecalciferol, loratadine, polyethylene glycol powder, hydrochlorothiazide, Dexlansoprazole, PROAIR HFA, KLOR-CON M20, BELVIQ, and bisacodyl. We will  continue to administer sodium chloride and sodium chloride.  No orders of the defined types were placed in this encounter.    Follow-up: No Follow-up on file.  Walker Kehr, MD

## 2016-05-31 NOTE — Assessment & Plan Note (Signed)
Phentermine  ° Potential benefits of a long term Phentermine use as well as potential risks  and complications were explained to the patient and were aknowledged. °

## 2016-05-31 NOTE — Assessment & Plan Note (Signed)
On Dexilant 

## 2016-05-31 NOTE — Assessment & Plan Note (Signed)
HCTZ 

## 2016-06-01 NOTE — Progress Notes (Signed)
Hyperplastic rectal polyp Colon recall 5 yrs (FHx)  My Chart note to patient so no letter

## 2016-08-08 ENCOUNTER — Telehealth: Payer: Self-pay | Admitting: Internal Medicine

## 2016-08-08 NOTE — Telephone Encounter (Signed)
Patient is requesting pap, hep C and zostavax.  Please advise.

## 2016-08-09 NOTE — Telephone Encounter (Signed)
Left patient vm and sent my chart message with date and time.

## 2016-08-09 NOTE — Telephone Encounter (Signed)
Please call pt back and schedule CPE. PCP wanted her back in 08/2016 for wellness/CPE See 05/31/16 OV note.   All the below requested can be addressed then. Thanks!

## 2016-08-31 ENCOUNTER — Other Ambulatory Visit: Payer: Self-pay | Admitting: Internal Medicine

## 2016-08-31 ENCOUNTER — Other Ambulatory Visit: Payer: Self-pay | Admitting: *Deleted

## 2016-08-31 MED ORDER — HYDROCHLOROTHIAZIDE 12.5 MG PO CAPS: 12.5000 mg | ORAL_CAPSULE | Freq: Two times a day (BID) | ORAL | 0 refills | Status: DC

## 2016-08-31 MED ORDER — LORATADINE-PSEUDOEPHEDRINE ER 10-240 MG PO TB24: 1.0000 | ORAL_TABLET | Freq: Every day | ORAL | 0 refills | Status: DC

## 2016-09-05 MED ORDER — LORATADINE-PSEUDOEPHEDRINE ER 10-240 MG PO TB24: 1.0000 | ORAL_TABLET | Freq: Every day | ORAL | 0 refills | Status: DC

## 2016-09-05 MED ORDER — HYDROCHLOROTHIAZIDE 12.5 MG PO CAPS: 12.5000 mg | ORAL_CAPSULE | Freq: Two times a day (BID) | ORAL | 0 refills | Status: DC

## 2016-09-13 ENCOUNTER — Encounter: Payer: Self-pay | Admitting: Internal Medicine

## 2016-09-13 ENCOUNTER — Ambulatory Visit (INDEPENDENT_AMBULATORY_CARE_PROVIDER_SITE_OTHER): Payer: PRIVATE HEALTH INSURANCE | Admitting: Internal Medicine

## 2016-09-13 VITALS — BP 128/66 | HR 78 | Ht 62.0 in | Wt 243.0 lb

## 2016-09-13 DIAGNOSIS — Z2911 Encounter for prophylactic immunotherapy for respiratory syncytial virus (RSV): Secondary | ICD-10-CM | POA: Diagnosis not present

## 2016-09-13 DIAGNOSIS — Z23 Encounter for immunization: Secondary | ICD-10-CM

## 2016-09-13 DIAGNOSIS — R202 Paresthesia of skin: Secondary | ICD-10-CM

## 2016-09-13 DIAGNOSIS — Z0000 Encounter for general adult medical examination without abnormal findings: Secondary | ICD-10-CM | POA: Diagnosis not present

## 2016-09-13 DIAGNOSIS — E559 Vitamin D deficiency, unspecified: Secondary | ICD-10-CM

## 2016-09-13 DIAGNOSIS — R635 Abnormal weight gain: Secondary | ICD-10-CM

## 2016-09-13 MED ORDER — HYDROCHLOROTHIAZIDE 12.5 MG PO CAPS: 12.5000 mg | ORAL_CAPSULE | Freq: Two times a day (BID) | ORAL | 2 refills | Status: DC

## 2016-09-13 NOTE — Assessment & Plan Note (Signed)
Heaviness in R leg - subjective - positional (?)

## 2016-09-13 NOTE — Assessment & Plan Note (Signed)
Use water in place of juice

## 2016-09-13 NOTE — Assessment & Plan Note (Signed)
We discussed age appropriate health related issues, including available/recomended screening tests and vaccinations. We discussed a need for adhering to healthy diet and exercise. Labs/EKG were reviewed/ordered. All questions were answered. She has a GYN Dr - PAP pending Mammo q 12 mo Colon up to date

## 2016-09-13 NOTE — Assessment & Plan Note (Signed)
On Vit D 

## 2016-09-13 NOTE — Assessment & Plan Note (Signed)
Wt Readings from Last 3 Encounters:  09/13/16 243 lb (110.2 kg)  05/31/16 245 lb (111.1 kg)  05/25/16 242 lb (109.8 kg)

## 2016-09-13 NOTE — Progress Notes (Signed)
Pre visit review using our clinic review tool, if applicable. No additional management support is needed unless otherwise documented below in the visit note. 

## 2016-09-13 NOTE — Progress Notes (Signed)
Subjective:  Patient ID: Jennifer Quinn, female    DOB: 11/06/1955  Age: 61 y.o. MRN: [id number]  CC: Annual Exam   HPI LITONYA DROEGE presents for a well exam C/o L leg feeling heavy at night  Outpatient Medications Prior to Visit  Medication Sig Dispense Refill  . bisacodyl (DULCOLAX) 5 MG EC tablet Take 5 mg by mouth once. For colon 10-4 x 4    . BLACK COHOSH PO Take 1 tablet by mouth daily.      Marland Kitchen Dexlansoprazole (DEXILANT) 30 MG capsule Take 1 capsule (30 mg total) by mouth daily. 90 capsule 3  . Fish Oil OIL Take 2,000 mg by mouth daily.      . hydrochlorothiazide (MICROZIDE) 12.5 MG capsule Take 1 capsule (12.5 mg total) by mouth 2 (two) times daily. Must keep January appt for future refills 60 capsule 0  . KLOR-CON M20 20 MEQ tablet TAKE 1 TABLET BY MOUTH EVERY DAY 30 tablet 11  . loratadine-pseudoephedrine (CLARITIN-D 24 HOUR) 10-240 MG 24 hr tablet Take 1 tablet by mouth daily. Non-drowsey 30 tablet 0  . polyethylene glycol powder (GLYCOLAX/MIRALAX) powder MIX 17 GRAMS IN LIQUID OF CHOICE AND DRINK ONCE DAILY AS NEEDED 527 g 3  . PROAIR HFA 108 (90 Base) MCG/ACT inhaler Inhale 1-2 puffs into the lungs every 6 (six) hours as needed. 1 Inhaler 5  . cholecalciferol (VITAMIN D) 1000 UNITS tablet Take 1 tablet (1,000 Units total) by mouth daily. 100 tablet 3  . loratadine (CLARITIN) 10 MG tablet Take 1 tablet (10 mg total) by mouth daily as needed for allergies. (Patient not taking: Reported on 05/11/2016) 100 tablet 3  . phentermine (ADIPEX-P) 37.5 MG tablet Take 1 tablet (37.5 mg total) by mouth daily before breakfast. 30 tablet 2   Facility-Administered Medications Prior to Visit  Medication Dose Route Frequency Provider Last Rate Last Dose  . 0.9 %  sodium chloride infusion  500 mL Intravenous Continuous Gatha Mayer, MD      . 0.9 %  sodium chloride infusion  500 mL Intravenous Continuous Gatha Mayer, MD        ROS Review of Systems  Constitutional: Negative for  activity change, appetite change, chills, fatigue and unexpected weight change.  HENT: Negative for congestion, mouth sores and sinus pressure.   Eyes: Negative for visual disturbance.  Respiratory: Negative for cough and chest tightness.   Gastrointestinal: Negative for abdominal pain and nausea.  Genitourinary: Negative for difficulty urinating, frequency and vaginal pain.  Musculoskeletal: Negative for back pain and gait problem.  Skin: Negative for pallor and rash.  Neurological: Negative for dizziness, tremors, weakness, numbness and headaches.  Psychiatric/Behavioral: Negative for confusion, sleep disturbance and suicidal ideas.    Objective:  BP 128/66   Pulse 78   Ht 5\' 2"  (1.575 m)   Wt 243 lb (110.2 kg)   SpO2 98%   BMI 44.45 kg/m   BP Readings from Last 3 Encounters:  09/13/16 128/66  05/31/16 120/70  05/25/16 128/70    Wt Readings from Last 3 Encounters:  09/13/16 243 lb (110.2 kg)  05/31/16 245 lb (111.1 kg)  05/25/16 242 lb (109.8 kg)    Physical Exam  Constitutional: She appears well-developed. No distress.  HENT:  Head: Normocephalic.  Right Ear: External ear normal.  Left Ear: External ear normal.  Nose: Nose normal.  Mouth/Throat: Oropharynx is clear and moist.  Eyes: Conjunctivae are normal. Pupils are equal, round, and reactive to light. Right  eye exhibits no discharge. Left eye exhibits no discharge.  Neck: Normal range of motion. Neck supple. No JVD present. No tracheal deviation present. No thyromegaly present.  Cardiovascular: Normal rate, regular rhythm and normal heart sounds.   Pulmonary/Chest: No stridor. No respiratory distress. She has no wheezes.  Abdominal: Soft. Bowel sounds are normal. She exhibits no distension and no mass. There is no tenderness. There is no rebound and no guarding.  Musculoskeletal: She exhibits no edema.  Lymphadenopathy:    She has no cervical adenopathy.  Neurological: She displays normal reflexes. No cranial  nerve deficit. She exhibits normal muscle tone. Coordination normal.  Skin: No rash noted. No erythema.  Psychiatric: She has a normal mood and affect. Her behavior is normal. Judgment and thought content normal.  Obese  Lab Results  Component Value Date   WBC 3.9 (L) 05/22/2015   HGB 12.8 05/22/2015   HCT 38.8 05/22/2015   PLT 213.0 05/22/2015   GLUCOSE 94 05/22/2015   CHOL 185 05/22/2015   TRIG 119.0 05/22/2015   HDL 34.70 (L) 05/22/2015   LDLDIRECT 149.1 12/11/2006   LDLCALC 126 (H) 05/22/2015   ALT 8 05/22/2015   AST 12 05/22/2015   NA 141 05/22/2015   K 3.9 05/22/2015   CL 104 05/22/2015   CREATININE 0.94 05/22/2015   BUN 15 05/22/2015   CO2 31 05/22/2015   TSH 1.53 05/22/2015   HGBA1C 6.1 05/22/2015    Mm Diag Breast Tomo Bilateral  Result Date: 04/04/2016 CLINICAL DATA:  Patient noted an area discoloration on her right nipple several weeks ago. This has since resolved. EXAM: 2D DIGITAL DIAGNOSTIC BILATERAL MAMMOGRAM WITH CAD AND ADJUNCT TOMO COMPARISON:  12/18/2014 and previous studies ACR Breast Density Category b: There are scattered areas of fibroglandular density. FINDINGS: There is no worrisome mass, distortion, or worrisome calcification within either breast. The parenchymal pattern is stable. On my directed physical examination of the right nipple region there is no ulceration, discoloration, or crusting. Mammographic images were processed with CAD. IMPRESSION: No findings worrisome for malignancy. Recommend screening mammography 1 year. RECOMMENDATION: Screening mammography 1 year. I have discussed the findings and recommendations with the patient. Results were also provided in writing at the conclusion of the visit. If applicable, a reminder letter will be sent to the patient regarding the next appointment. BI-RADS CATEGORY  1: Negative. Electronically Signed   By: Altamese Cabal M.D.   On: 04/04/2016 09:42    Assessment & Plan:   There are no diagnoses linked  to this encounter. I am having Ms. Ellzey maintain her Fish Oil, BLACK COHOSH PO, cholecalciferol, loratadine, polyethylene glycol powder, Dexlansoprazole, KLOR-CON M20, bisacodyl, PROAIR HFA, phentermine, hydrochlorothiazide, and loratadine-pseudoephedrine. We will continue to administer sodium chloride and sodium chloride.  No orders of the defined types were placed in this encounter.    Follow-up: No Follow-up on file.  Walker Kehr, MD

## 2016-09-13 NOTE — Addendum Note (Signed)
Addended by: Cresenciano Lick on: 09/13/2016 04:21 PM   Modules accepted: Orders

## 2016-09-14 ENCOUNTER — Other Ambulatory Visit (INDEPENDENT_AMBULATORY_CARE_PROVIDER_SITE_OTHER): Payer: PRIVATE HEALTH INSURANCE

## 2016-09-14 DIAGNOSIS — Z0000 Encounter for general adult medical examination without abnormal findings: Secondary | ICD-10-CM | POA: Diagnosis not present

## 2016-09-14 DIAGNOSIS — R202 Paresthesia of skin: Secondary | ICD-10-CM

## 2016-09-14 LAB — HEPATIC FUNCTION PANEL
ALK PHOS: 63 U/L (ref 39–117)
ALT: 10 U/L (ref 0–35)
AST: 12 U/L (ref 0–37)
Albumin: 4.3 g/dL (ref 3.5–5.2)
BILIRUBIN TOTAL: 0.5 mg/dL (ref 0.2–1.2)
Bilirubin, Direct: 0.1 mg/dL (ref 0.0–0.3)
Total Protein: 7.4 g/dL (ref 6.0–8.3)

## 2016-09-14 LAB — LIPID PANEL
Cholesterol: 186 mg/dL (ref 0–200)
HDL: 36.5 mg/dL — ABNORMAL LOW (ref >39.00)
LDL Cholesterol: 117 mg/dL — ABNORMAL HIGH (ref 0–99)
NONHDL: 149.22
Total CHOL/HDL Ratio: 5
Triglycerides: 162 mg/dL — ABNORMAL HIGH (ref 0.0–149.0)
VLDL: 32.4 mg/dL (ref 0.0–40.0)

## 2016-09-14 LAB — CBC WITH DIFFERENTIAL/PLATELET
BASOS ABS: 0.1 10*3/uL (ref 0.0–0.1)
Basophils Relative: 1.7 % (ref 0.0–3.0)
EOS PCT: 3.2 % (ref 0.0–5.0)
Eosinophils Absolute: 0.2 10*3/uL (ref 0.0–0.7)
HCT: 38.7 % (ref 36.0–46.0)
Hemoglobin: 13.1 g/dL (ref 12.0–15.0)
LYMPHS ABS: 2.1 10*3/uL (ref 0.7–4.0)
Lymphocytes Relative: 36.1 % (ref 12.0–46.0)
MCHC: 33.8 g/dL (ref 30.0–36.0)
MCV: 81.4 fl (ref 78.0–100.0)
MONO ABS: 0.3 10*3/uL (ref 0.1–1.0)
Monocytes Relative: 5.6 % (ref 3.0–12.0)
NEUTROS PCT: 53.4 % (ref 43.0–77.0)
Neutro Abs: 3.1 10*3/uL (ref 1.4–7.7)
Platelets: 238 10*3/uL (ref 150.0–400.0)
RBC: 4.76 Mil/uL (ref 3.87–5.11)
RDW: 15 % (ref 11.5–15.5)
WBC: 5.8 10*3/uL (ref 4.0–10.5)

## 2016-09-14 LAB — URINALYSIS
Bilirubin Urine: NEGATIVE
HGB URINE DIPSTICK: NEGATIVE
Ketones, ur: NEGATIVE
LEUKOCYTES UA: NEGATIVE
NITRITE: NEGATIVE
SPECIFIC GRAVITY, URINE: 1.025 (ref 1.000–1.030)
Total Protein, Urine: NEGATIVE
UROBILINOGEN UA: 0.2 (ref 0.0–1.0)
Urine Glucose: NEGATIVE
pH: 6 (ref 5.0–8.0)

## 2016-09-14 LAB — BASIC METABOLIC PANEL
BUN: 12 mg/dL (ref 6–23)
CALCIUM: 9.2 mg/dL (ref 8.4–10.5)
CO2: 31 meq/L (ref 19–32)
Chloride: 103 mEq/L (ref 96–112)
Creatinine, Ser: 0.98 mg/dL (ref 0.40–1.20)
GFR: 73.87 mL/min (ref >60.00)
GLUCOSE: 84 mg/dL (ref 70–99)
Potassium: 3.8 mEq/L (ref 3.5–5.1)
Sodium: 141 mEq/L (ref 135–145)

## 2016-09-14 LAB — HEMOGLOBIN A1C: HEMOGLOBIN A1C: 6.1 % (ref 4.6–6.5)

## 2016-09-14 LAB — VITAMIN B12: Vitamin B-12: 709 pg/mL (ref 211–911)

## 2016-09-14 LAB — TSH: TSH: 3.44 u[IU]/mL (ref 0.35–4.50)

## 2016-09-27 ENCOUNTER — Other Ambulatory Visit: Payer: Self-pay | Admitting: Internal Medicine

## 2016-10-11 ENCOUNTER — Encounter: Payer: PRIVATE HEALTH INSURANCE | Admitting: Obstetrics and Gynecology

## 2016-10-11 ENCOUNTER — Encounter: Payer: Self-pay | Admitting: Obstetrics and Gynecology

## 2016-10-18 ENCOUNTER — Encounter: Payer: PRIVATE HEALTH INSURANCE | Admitting: Obstetrics and Gynecology

## 2016-10-21 ENCOUNTER — Ambulatory Visit (INDEPENDENT_AMBULATORY_CARE_PROVIDER_SITE_OTHER): Payer: PRIVATE HEALTH INSURANCE | Admitting: Certified Nurse Midwife

## 2016-10-21 ENCOUNTER — Encounter: Payer: Self-pay | Admitting: Certified Nurse Midwife

## 2016-10-21 VITALS — BP 122/72 | HR 60 | Resp 16 | Ht 61.75 in | Wt 238.0 lb

## 2016-10-21 DIAGNOSIS — Z780 Asymptomatic menopausal state: Secondary | ICD-10-CM | POA: Diagnosis not present

## 2016-10-21 DIAGNOSIS — Z01419 Encounter for gynecological examination (general) (routine) without abnormal findings: Secondary | ICD-10-CM | POA: Diagnosis not present

## 2016-10-21 DIAGNOSIS — Z124 Encounter for screening for malignant neoplasm of cervix: Secondary | ICD-10-CM | POA: Diagnosis not present

## 2016-10-21 NOTE — Patient Instructions (Signed)

## 2016-10-21 NOTE — Progress Notes (Addendum)
61 y.o. [id number]. Married  African American Fe here to establish gyn care for annual exam.  Last aex 2016. Post menopausal, no vaginal bleeding. Some vaginal dryness. Currently has not tried anything for it. Sees PCP for aex and manages edema with HCTZ, labs.  Patient has family history of breast cancer of sister at age 80, aware of genetic screening and has felt not needed due to her current age. No other health concerns today.  No LMP recorded. Patient is postmenopausal.          Sexually active: Yes.    The current method of family planning is post menopausal status.    Exercising: No.  exercise Smoker:  no  Health Maintenance: Pap:  2016 neg per patient MMG:  04-04-16 category b density birads 1:neg, [facility] Colonoscopy:  2017 polyp f/u 38yrs BMD:   None will schedule this year TDaP:  2013 Shingles: 2018 Pneumonia: no Hep C and HIV: not done Labs: pcp Self breast exam: done monthly   reports that she has quit smoking. She has never used smokeless tobacco. She reports that she does not drink alcohol or use drugs.  Past Medical History:  Diagnosis Date  . Allergy   . Anemia    past hx with pregnancies  . Bronchitis   . Fibroid   . GERD (gastroesophageal reflux disease)   . Hypertension     Past Surgical History:  Procedure Laterality Date  . COLONOSCOPY  2007, 01/25/2011. 05/25/16    Current Outpatient Prescriptions  Medication Sig Dispense Refill  . BLACK COHOSH PO Take 1 tablet by mouth daily.      . Cholecalciferol (VITAMIN D PO) Take by mouth daily.    . Fish Oil OIL Take 2,000 mg by mouth daily.      . hydrochlorothiazide (MICROZIDE) 12.5 MG capsule Take 1 capsule (12.5 mg total) by mouth 2 (two) times daily. 180 capsule 2  . KLOR-CON M20 20 MEQ tablet TAKE 1 TABLET BY MOUTH EVERY DAY 30 tablet 11  . loratadine-pseudoephedrine (CLARITIN-D 24 HOUR) 10-240 MG 24 hr tablet Take 1 tablet by mouth daily. Non-drowsey 30 tablet 0  . polyethylene glycol powder  (GLYCOLAX/MIRALAX) powder MIX 17 GRAMS IN LIQUID OF CHOICE AND DRINK ONCE DAILY AS NEEDED 527 g 3  . PROAIR HFA 108 (90 Base) MCG/ACT inhaler Inhale 1-2 puffs into the lungs every 6 (six) hours as needed. 1 Inhaler 5   No current facility-administered medications for this visit.     Family History  Problem Relation Age of Onset  . Colon cancer Mother   . Diabetes Mother   . Breast cancer Sister   . Stroke Father   . Thyroid disease Daughter   . Multiple sclerosis Daughter     ROS:  Pertinent items are noted in HPI.  Otherwise, a comprehensive ROS was negative.  Exam:   BP 122/72   Pulse 60   Resp 16   Ht 5' 1.75" (1.568 m)   Wt 238 lb (108 kg)   BMI 43.88 kg/m  Height: 5' 1.75" (156.8 cm) Ht Readings from Last 3 Encounters:  10/21/16 5' 1.75" (1.568 m)  09/13/16 5\' 2"  (1.575 m)  05/25/16 5\' 2"  (1.575 m)    General appearance: alert, cooperative and appears stated age Head: Normocephalic, without obvious abnormality, atraumatic Neck: no adenopathy, supple, symmetrical, trachea midline and thyroid normal to inspection and palpation Lungs: clear to auscultation bilaterally Breasts: normal appearance, no masses or tenderness, No nipple retraction or dimpling Heart:  regular rate and rhythm Abdomen: soft, non-tender; no masses,  no organomegaly Extremities: extremities normal, atraumatic, no cyanosis or edema Skin: Skin color, texture, turgor normal. No rashes or lesions Lymph nodes: Cervical, supraclavicular, and axillary nodes normal. No abnormal inguinal nodes palpated Neurologic: Grossly normal   Pelvic: External genitalia:  no lesions              Urethra:  normal appearing urethra with no masses, tenderness or lesions              Bartholin's and Skene's: normal                 Vagina: normal appearing vagina with normal color and discharge, no lesions              Cervix: multiparous appearance, no cervical motion tenderness and no lesions              Pap taken:  Yes.   Bimanual Exam:  Uterus:  normal size, contour, position, consistency, mobility, non-tender and limited by body habitus              Adnexa: normal adnexa, no mass, fullness, tenderness and limited by body habitus               Rectovaginal: Confirms               Anus:  normal sphincter tone, no lesions  Chaperone present: yes  A:  Well Woman with normal exam  Menopausal no HRT  Hypertension with PCP management of medications  Family history of breast cancer sister 53 deceased  P:   Reviewed health and wellness pertinent to exam  Aware of need to advise if vaginal bleeding  Continue PCP follow up as indicated  Pap smear as above with HPVHR   counseled on breast self exam, mammography screening, menopause, adequate intake of calcium and vitamin D, diet and exercise  return annually or prn  An After Visit Summary was printed and given to the patient.

## 2016-10-26 LAB — IPS PAP TEST WITH HPV

## 2016-10-27 NOTE — Progress Notes (Signed)
Encounter reviewed. I would offer the patient evaluation with a genetic counselor since she has a first degree relative who was diagnosed with breast cancer before the age of 29 (died at 26).  Sumner Boast, MD

## 2016-11-04 ENCOUNTER — Other Ambulatory Visit: Payer: Self-pay | Admitting: Internal Medicine

## 2016-11-07 MED ORDER — LORATADINE-PSEUDOEPHEDRINE ER 10-240 MG PO TB24: 1.0000 | ORAL_TABLET | Freq: Every day | ORAL | 3 refills | Status: DC

## 2016-11-08 ENCOUNTER — Other Ambulatory Visit: Payer: Self-pay | Admitting: Internal Medicine

## 2016-12-27 ENCOUNTER — Other Ambulatory Visit: Payer: Self-pay | Admitting: Internal Medicine

## 2017-02-07 ENCOUNTER — Emergency Department (HOSPITAL_COMMUNITY)
Admission: EM | Admit: 2017-02-07 | Discharge: 2017-02-07 | Disposition: A | Discharge status: Home / Self Care | Payer: PRIVATE HEALTH INSURANCE | Attending: Emergency Medicine | Admitting: Emergency Medicine

## 2017-02-07 ENCOUNTER — Encounter (HOSPITAL_COMMUNITY): Payer: Self-pay | Admitting: *Deleted

## 2017-02-07 DIAGNOSIS — Y990 Civilian activity done for income or pay: Secondary | ICD-10-CM | POA: Insufficient documentation

## 2017-02-07 DIAGNOSIS — Y93F9 Activity, other caregiving: Secondary | ICD-10-CM | POA: Insufficient documentation

## 2017-02-07 DIAGNOSIS — T7840XA Allergy, unspecified, initial encounter: Secondary | ICD-10-CM | POA: Diagnosis present

## 2017-02-07 DIAGNOSIS — Y9289 Other specified places as the place of occurrence of the external cause: Secondary | ICD-10-CM | POA: Insufficient documentation

## 2017-02-07 DIAGNOSIS — L509 Urticaria, unspecified: Secondary | ICD-10-CM

## 2017-02-07 DIAGNOSIS — T783XXA Angioneurotic edema, initial encounter: Secondary | ICD-10-CM | POA: Insufficient documentation

## 2017-02-07 DIAGNOSIS — X58XXXA Exposure to other specified factors, initial encounter: Secondary | ICD-10-CM | POA: Insufficient documentation

## 2017-02-07 DIAGNOSIS — Z79899 Other long term (current) drug therapy: Secondary | ICD-10-CM | POA: Insufficient documentation

## 2017-02-07 DIAGNOSIS — I10 Essential (primary) hypertension: Secondary | ICD-10-CM | POA: Insufficient documentation

## 2017-02-07 DIAGNOSIS — Z87891 Personal history of nicotine dependence: Secondary | ICD-10-CM | POA: Diagnosis not present

## 2017-02-07 MED ORDER — RANITIDINE HCL 150 MG PO TABS: 150.0000 mg | ORAL_TABLET | Freq: Two times a day (BID) | ORAL | 0 refills | Status: DC

## 2017-02-07 MED ORDER — PREDNISONE 20 MG PO TABS: 60.0000 mg | ORAL_TABLET | Freq: Every day | ORAL | 0 refills | Status: AC

## 2017-02-07 MED ORDER — DIPHENHYDRAMINE HCL 25 MG PO TABS: 50.0000 mg | ORAL_TABLET | ORAL | 0 refills | Status: DC | PRN

## 2017-02-07 MED ORDER — METHYLPREDNISOLONE SODIUM SUCC 125 MG IJ SOLR
125.0000 mg | Freq: Once | INTRAMUSCULAR | Status: AC
  Administered 2017-02-07: 125 mg via INTRAVENOUS
  Filled 2017-02-07: qty 2

## 2017-02-07 MED ORDER — EPINEPHRINE 0.3 MG/0.3ML IJ SOAJ: 0.3000 mg | Freq: Once | INTRAMUSCULAR | 2 refills | Status: AC

## 2017-02-07 MED ORDER — FAMOTIDINE IN NACL 20-0.9 MG/50ML-% IV SOLN
20.0000 mg | Freq: Once | INTRAVENOUS | Status: AC
  Administered 2017-02-07: 20 mg via INTRAVENOUS
  Filled 2017-02-07: qty 50

## 2017-02-07 NOTE — Discharge Instructions (Signed)
Take Prednisone, Benadryl, and zantac as prescribed, with the prednisone starting tomorrow since today's dose was already given to you here. If you develop tongue/lip/face swelling, difficulty swallowing or breathing, or any other significant allergic reaction symptoms, then use the EpiPen as directed and call 911. Continue your usual home medications. Get plenty of rest and drink plenty of fluids. Avoid any known triggers, avoid cytotoxin exposure if possible, and always make sure to have gloves on whenever you're around it. Please followup with your primary doctor in 3-5 days for recheck of symptoms. Follow up with the allergy and asthma center in 1 week for ongoing evaluation and management of your allergic reaction. Return to the ER for changes or worsening symptoms

## 2017-02-07 NOTE — ED Triage Notes (Signed)
EMS called to [facility].  Found patient with complaints of allergic reaction to unknown source.  Patient denies any history of allergies.  Currently patient only complaint of generalized body itching.  No airway issue noted.

## 2017-02-07 NOTE — ED Provider Notes (Signed)
[facility] DEPT Provider Note   CSN: [id number] Arrival date & time: 02/07/17  1230     History   Chief Complaint Chief Complaint  Patient presents with  . Allergic Reaction    HPI Jennifer Quinn is a 61 y.o. female with a PMHx of GERD, HTN, anemia, and chronic constipation, who presents to the ED with complaints of allergic reaction that occurred around 10 AM while she was at work. 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 She used 2 gloves when she was handling that medication, however when she handed the cup to the patient she did not have any gloves on. That is the only known trigger that she can think of, she has not had any new changes in medications for herself, has not changed soaps or detergents, creams or lotions, common to contact with any plants or animals, had any exposure to similar rashes, and come into contact with any perfumes that she is aware of. She cannot think of anything else that may have triggered this. She has never had any allergic reaction before. She states that shortly after passing the medications out, she developed facial swelling and tightness and a flushed feeling, felt slightly lightheaded, and then developed a generalized itchy urticarial rash over her entire body. She initially tried Claritin which provided no relief, then she took 25 mg oral Benadryl which helped some, and then the EMS personnel gave her another 25mg  oral benadryl which continued to help. Upon arrival she was given Pepcid 20 mg IV and one 25 mg IV Solu-Medrol, which has continued to provide relief. No known aggravating factors. She states that the lightheadedness was transient and has completely resolved. She denies having any tongue swelling, oral swelling, drooling, trismus, difficulty swallowing or breathing, wheezing, rhinorrhea, recent fevers, chills, CP, SOB, abd pain,  N/V/D/C, hematuria, dysuria, myalgias, arthralgias, numbness, tingling, focal weakness, or any other complaints at this time.    The history is provided by the patient and medical records. No language interpreter was used.  Allergic Reaction  Presenting symptoms: itching, rash and swelling   Presenting symptoms: no difficulty breathing, no difficulty swallowing and no wheezing   Severity:  Moderate Duration:  3 hours Prior allergic episodes:  No prior episodes Context: medications (passing Cytotoxin to patient, had 2 gloves on when handling med, but handed the cup to the pt without gloves on)   Context: not animal exposure, not cosmetics, not insect bite/sting and not new detergents/soaps   Relieved by:  Antihistamines (benadryl) Worsened by:  Nothing Ineffective treatments:  Antihistamines (claritin)   Past Medical History:  Diagnosis Date  . Allergy   . Anemia    past hx with pregnancies  . Bronchitis   . Fibroid   . GERD (gastroesophageal reflux disease)   . Hypertension     Patient Active Problem List   Diagnosis Date Noted  . Paresthesia 09/13/2016  . Nipple lesion 09/23/2015  . GERD (gastroesophageal reflux disease) 05/22/2015  . Chest pain, atypical 05/22/2015  . Well adult exam 12/12/2011  . Anxiety 12/12/2011  . Constipation 12/12/2011  . ELEVATED BLOOD PRESSURE 09/03/2010  . COLONIC POLYPS, HX OF 09/03/2010  . ALLERGIC RHINITIS 12/15/2009  . VERTIGO 04/25/2008  . Vitamin D deficiency 08/28/2007  . OBESITY, MORBID 08/28/2007  . Edema 08/28/2007  . WEIGHT GAIN 08/28/2007  . DEPRESSION 06/09/2007    Past  Surgical History:  Procedure Laterality Date  . COLONOSCOPY  2007, 01/25/2011. 05/25/16    OB History    Gravida Para Term Preterm AB Living   4 3 3   1 3    SAB TAB Ectopic Multiple Live Births   1               Home Medications    Prior to Admission medications   Medication Sig Start Date End Date Taking? Authorizing Provider  BLACK COHOSH PO  Take 50 mg by mouth daily.    Yes [provider]  Cholecalciferol (VITAMIN D PO) Take 1,000 Units by mouth daily.    Yes [provider]  DEXILANT 30 MG capsule TAKE 1 CAPSULE EVERY DAY 12/28/16  Yes Plotnikov, Evie Lacks, MD  doxycycline (VIBRA-TABS) 100 MG tablet Take 100 mg by mouth 2 (two) times daily. 01/29/17  Yes [provider]  Fish Oil OIL Take 2,000 mg by mouth daily.     Yes [provider]  hydrochlorothiazide (MICROZIDE) 12.5 MG capsule Take 1 capsule (12.5 mg total) by mouth 2 (two) times daily. 09/13/16  Yes Plotnikov, Evie Lacks, MD  KLOR-CON M20 20 MEQ tablet TAKE 1 TABLET BY MOUTH EVERY DAY 11/20/15  Yes Plotnikov, Evie Lacks, MD  loratadine-pseudoephedrine (CLARITIN-D 24 HOUR) 10-240 MG 24 hr tablet Take 1 tablet by mouth daily. Non-drowsey 11/07/16  Yes Plotnikov, Evie Lacks, MD  polyethylene glycol powder (GLYCOLAX/MIRALAX) powder MIX 17 GRAMS IN LIQUID OF CHOICE AND DRINK ONCE DAILY AS NEEDED Patient taking differently: MIX 17 GRAMS IN LIQUID OF CHOICE AND DRINK ONCE DAILY AS NEEDED FOR CONSTIPATION 01/20/14  Yes Plotnikov, Evie Lacks, MD  PROAIR HFA 108 [phone] Base) MCG/ACT inhaler Inhale 1-2 puffs into the lungs every 6 (six) hours as needed. 05/31/16  Yes Plotnikov, Evie Lacks, MD  [employer] ALLERGY RELIEF-D 10-240 MG 24 hr tablet TAKE 1 TABLET BY MOUTH EVERY DAY Patient not taking: Reported on 02/07/2017 11/08/16   Plotnikov, Evie Lacks, MD    Family History Family History  Problem Relation Age of Onset  . Colon cancer Mother   . Diabetes Mother   . Breast cancer Sister   . Stroke Father   . Thyroid disease Daughter   . Multiple sclerosis Daughter     Social History Social History  Substance Use Topics  . Smoking status: Former Research scientist (life sciences)  . Smokeless tobacco: Never Used  . Alcohol use No     Allergies   Patient has no known allergies.   Review of Systems Review of Systems  Constitutional: Negative for chills and fever.  HENT: Positive for  facial swelling. Negative for drooling, rhinorrhea and trouble swallowing.   Respiratory: Negative for choking, shortness of breath and wheezing.   Cardiovascular: Negative for chest pain.  Gastrointestinal: Negative for abdominal pain, constipation, diarrhea, nausea and vomiting.  Genitourinary: Negative for dysuria and hematuria.  Musculoskeletal: Negative for arthralgias and myalgias.  Skin: Positive for itching and rash.  Allergic/Immunologic: Negative for immunocompromised state.  Neurological: Positive for light-headedness (resolved). Negative for weakness and numbness.  Psychiatric/Behavioral: Negative for confusion.   All other systems reviewed and are negative for acute change except as noted in the HPI.    Physical Exam Updated Vital Signs BP 134/67 (BP Location: Left Arm)   Pulse 66   Temp 97.9 F (36.6 C) (Oral)   Resp 18   Ht 5\' 2"  (1.575 m)   Wt 108.9 kg (240 lb)   SpO2 96%   BMI 43.90  kg/m   Physical Exam  Constitutional: She is oriented to person, place, and time. Vital signs are normal. She appears well-developed and well-nourished.  Non-toxic appearance. No distress.  Afebrile, nontoxic, NAD  HENT:  Head: Normocephalic and atraumatic.  Nose: Nose normal.  Mouth/Throat: Uvula is midline, oropharynx is clear and moist and mucous membranes are normal. No trismus in the jaw. No uvula swelling.  No significant facial swelling, no tongue/lip swelling, airway patent, handling secretions well. Nose clear. Oropharynx clear and moist, without uvular swelling or deviation, no trismus or drooling, no tonsillar swelling or erythema, no exudates.    Eyes: Conjunctivae and EOM are normal. Right eye exhibits no discharge. Left eye exhibits no discharge.  Neck: Normal range of motion. Neck supple.  Cardiovascular: Normal rate, regular rhythm, normal heart sounds and intact distal pulses.  Exam reveals no gallop and no friction rub.   No murmur heard. Pulmonary/Chest: Effort  normal and breath sounds normal. No respiratory distress. She has no decreased breath sounds. She has no wheezes. She has no rhonchi. She has no rales.  CTAB in all lung fields, no w/r/r, no hypoxia or increased WOB, speaking in full sentences, SpO2 96% on RA   Abdominal: Soft. Normal appearance and bowel sounds are normal. She exhibits no distension. There is no tenderness. There is no rigidity, no rebound, no guarding, no CVA tenderness, no tenderness at McBurney's point and negative Murphy's sign.  Musculoskeletal: Normal range of motion.  Neurological: She is alert and oriented to person, place, and time. She has normal strength. No sensory deficit.  Skin: Skin is warm, dry and intact. Rash noted. Rash is urticarial.  Faint resolving urticarial rash generalized throughout entire body, mostly improved, some faint urticaria still present on b/l arms and chest, but faintly pinkish; no flushing of body or face. No significant facial swelling noted.   Psychiatric: She has a normal mood and affect.  Nursing note and vitals reviewed.    ED Treatments / Results  Labs (all labs ordered are listed, but only abnormal results are displayed) Labs Reviewed - No data to display  EKG  EKG Interpretation None       Radiology No results found.  Procedures Procedures (including critical care time)  Medications Ordered in ED Medications  famotidine (PEPCID) IVPB 20 mg premix (0 mg Intravenous Stopped 02/07/17 1509)  methylPREDNISolone sodium succinate (SOLU-MEDROL) 125 mg/2 mL injection 125 mg (125 mg Intravenous Given 02/07/17 1307)     Initial Impression / Assessment and Plan / ED Course  I have reviewed the triage vital signs and the nursing notes.  Pertinent labs & imaging results that were available during my care of the patient were reviewed by me and considered in my medical decision making (see chart for details).     61 y.o. female here with allergic reaction to unknown trigger.  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 That is the only new medication that she came into contact with. She has never had an allergic reaction before. Initially had facial swelling tightness and flushed feeling as well as a generalized itchy rash and felt slightly lightheaded initially but that resolved. She was given 50 mg by mouth Benadryl en route which helped slightly, on arrival she was given Solu-Medrol 125 mg and Pepcid 20 mg IV which has helped as well. On exam, no wheezing, no airway difficulties, handling secretions, no facial swelling noted,  faint resolving urticarial rash noted generalized over her entire body. Vital signs stable. Will continue to monitor for about 1-2 hours and then reassess. Will likely go home with EpiPen, however EpiPen not currently needed. We'll continue to monitor and reassess shortly.  3:31 PM Pt continues to feel improved, no ongoing symptoms, hives entirely resolved. Will rx epipen, discussed proper use of this, and advised that if she has anaphylactic symptoms and needs to use it then she should also be calling 911 to come immediately back; will rx benadryl, zantac, and prednisone to use for the next 4 days. Discussed avoidance of triggers, in this case it's hard to say that it was definitely the cytotoxin but would be cautious about being around this medication, and always wear gloves if she's around this medication (including when handing the cup to a patient). Advised f/up with allergy clinic in 1-2wks for further testing and management. F/up with PCP in 3-5 days for recheck as well. I explained the diagnosis and have given explicit precautions to return to the ER including for any other new or worsening symptoms. The patient understands and accepts the medical plan as it's been dictated and I have answered their questions. Discharge instructions concerning home care and  prescriptions have been given. The patient is STABLE and is discharged to home in good condition.    Final Clinical Impressions(s) / ED Diagnoses   Final diagnoses:  Allergic reaction, initial encounter  Hives  Angioedema, initial encounter    New Prescriptions New Prescriptions   DIPHENHYDRAMINE (BENADRYL) 25 MG TABLET    Take 2 tablets (50 mg total) by mouth every 4 (four) hours as needed for itching.   EPINEPHRINE 0.3 MG/0.3 ML IJ SOAJ INJECTION    Inject 0.3 mLs (0.3 mg total) into the muscle once.   PREDNISONE (DELTASONE) 20 MG TABLET    Take 3 tablets (60 mg total) by mouth daily with breakfast. x4 days, Starting tomorrow 02/08/17   RANITIDINE (ZANTAC) 150 MG TABLET    Take 1 tablet (150 mg total) by mouth 2 (two) times daily. x4 days, starting tonight (02/07/17) at bedtime     [address] [place], [place] 02/07/17 1537    Lacretia Leigh, MD 02/08/17 [phone]

## 2017-05-19 ENCOUNTER — Other Ambulatory Visit: Payer: Self-pay | Admitting: Internal Medicine

## 2017-05-19 DIAGNOSIS — Z1231 Encounter for screening mammogram for malignant neoplasm of breast: Secondary | ICD-10-CM

## 2017-06-06 ENCOUNTER — Ambulatory Visit
Admission: RE | Admit: 2017-06-06 | Discharge: 2017-06-06 | Disposition: A | Discharge status: Home / Self Care | Payer: PRIVATE HEALTH INSURANCE | Source: Ambulatory Visit | Attending: Internal Medicine | Admitting: Internal Medicine

## 2017-06-06 DIAGNOSIS — Z1231 Encounter for screening mammogram for malignant neoplasm of breast: Secondary | ICD-10-CM

## 2017-06-08 ENCOUNTER — Other Ambulatory Visit: Payer: Self-pay | Admitting: Internal Medicine

## 2017-10-04 ENCOUNTER — Other Ambulatory Visit: Payer: Self-pay | Admitting: Internal Medicine

## 2018-02-23 ENCOUNTER — Ambulatory Visit: Payer: PRIVATE HEALTH INSURANCE | Admitting: Certified Nurse Midwife

## 2018-02-23 ENCOUNTER — Encounter: Payer: Self-pay | Admitting: Certified Nurse Midwife

## 2018-02-23 ENCOUNTER — Other Ambulatory Visit: Payer: Self-pay

## 2018-02-23 VITALS — BP 116/62 | HR 70 | Resp 16 | Ht 61.75 in | Wt 244.0 lb

## 2018-02-23 DIAGNOSIS — Z01419 Encounter for gynecological examination (general) (routine) without abnormal findings: Secondary | ICD-10-CM | POA: Diagnosis not present

## 2018-02-23 DIAGNOSIS — Z780 Asymptomatic menopausal state: Secondary | ICD-10-CM

## 2018-02-23 DIAGNOSIS — E663 Overweight: Secondary | ICD-10-CM

## 2018-02-23 NOTE — Patient Instructions (Signed)

## 2018-02-23 NOTE — Progress Notes (Signed)
62 y.o. [id number] Married  African American Fe here for annual exam. Post menopausal no vaginal bleeding. Some vaginal dryness, using vaseline external only. Felt a nodule in vaginal area several months ago and has watched it, no discharge, used Epsom salt tub bath with resolution. Sees Dr. Durenda Age for aex , labs, hypertension/allergies and GERD management. All stable per patient. Walking daily for job and trying to keep moving for health and wellness. No other health issues today.  No LMP recorded. Patient is postmenopausal.          Sexually active: No.  The current method of family planning is post menopausal status.    Exercising: No.  exercise Smoker:  no  Health Maintenance: Pap:  2016 neg per patient, 10-21-16 neg HPV HR neg History of Abnormal Pap: no MMG:  06-06-17 category b density birads 1:neg Self Breast exams: yes Colonoscopy:  2017 polyp f/u 8yrs BMD:   none TDaP:  2013 Shingles: 2018 Pneumonia: not done Hep C and HIV: unsure Labs: with PCP   reports that she has quit smoking. She has never used smokeless tobacco. She reports that she drinks about 0.6 oz of alcohol per week. She reports that she does not use drugs.  Past Medical History:  Diagnosis Date  . Allergy   . Anemia    past hx with pregnancies  . Bronchitis   . Fibroid   . GERD (gastroesophageal reflux disease)   . Hypertension     Past Surgical History:  Procedure Laterality Date  . COLONOSCOPY  2007, 01/25/2011. 05/25/16    Current Outpatient Medications  Medication Sig Dispense Refill  . BLACK COHOSH PO Take 50 mg by mouth daily.     . Cholecalciferol (VITAMIN D PO) Take 1,000 Units by mouth daily.     . [facility] ALLERGY RELIEF-D 10-240 MG 24 hr tablet TAKE 1 TABLET BY MOUTH EVERY DAY 30 tablet 11  . DEXILANT 30 MG capsule TAKE 1 CAPSULE EVERY DAY 90 capsule 2  . diphenhydrAMINE (BENADRYL) 25 MG tablet Take 2 tablets (50 mg total) by mouth every 4 (four) hours as needed for itching. 20 tablet 0  . Fish  Oil OIL Take 2,000 mg by mouth daily.      . hydrochlorothiazide (MICROZIDE) 12.5 MG capsule Take 1 capsule (12.5 mg total) by mouth 2 (two) times daily. 180 capsule 2  . KLOR-CON M20 20 MEQ tablet TAKE 1 TABLET BY MOUTH EVERY DAY 30 tablet 4  . polyethylene glycol powder (GLYCOLAX/MIRALAX) powder MIX 17 GRAMS IN LIQUID OF CHOICE AND DRINK ONCE DAILY AS NEEDED (Patient taking differently: MIX 17 GRAMS IN LIQUID OF CHOICE AND DRINK ONCE DAILY AS NEEDED FOR CONSTIPATION) 527 g 3  . PROAIR HFA 108 (90 Base) MCG/ACT inhaler Inhale 1-2 puffs into the lungs every 6 (six) hours as needed. 1 Inhaler 5  . ranitidine (ZANTAC) 150 MG tablet Take 1 tablet (150 mg total) by mouth 2 (two) times daily. x4 days, starting tonight (02/07/17) at bedtime 10 tablet 0   No current facility-administered medications for this visit.     Family History  Problem Relation Age of Onset  . Colon cancer Mother   . Diabetes Mother   . Breast cancer Sister   . Stroke Father   . Thyroid disease Daughter   . Multiple sclerosis Daughter     ROS:  Pertinent items are noted in HPI.  Otherwise, a comprehensive ROS was negative.  Exam:   BP 116/62   Pulse 70  Resp 16   Ht 5' 1.75" (1.568 m)   Wt 244 lb (110.7 kg)   BMI 44.99 kg/m  Height: 5' 1.75" (156.8 cm) Ht Readings from Last 3 Encounters:  02/23/18 5' 1.75" (1.568 m)  02/07/17 5\' 2"  (1.575 m)  10/21/16 5' 1.75" (1.568 m)    General appearance: alert, cooperative and appears stated age Head: Normocephalic, without obvious abnormality, atraumatic Neck: no adenopathy, supple, symmetrical, trachea midline and thyroid normal to inspection and palpation Lungs: clear to auscultation bilaterally Breasts: normal appearance, no masses or tenderness, No nipple retraction or dimpling, No nipple discharge or bleeding, No axillary or supraclavicular adenopathy Heart: regular rate and rhythm Abdomen: soft, non-tender; no masses,  no organomegaly Extremities: extremities  normal, atraumatic, no cyanosis or edema Skin: Skin color, texture, turgor normal. No rashes or lesions Lymph nodes: Cervical, supraclavicular, and axillary nodes normal. No abnormal inguinal nodes palpated Neurologic: Grossly normal   Pelvic: External genitalia:  no lesions              Urethra:  normal appearing urethra with no masses, tenderness or lesions              Bartholin's and Skene's: normal                 Vagina: normal appearing vagina with normal color and discharge, no lesions              Cervix: no cervical motion tenderness, no lesions and normal appearance              Pap taken: No. Bimanual Exam:  Uterus:  normal size, contour, position, consistency, mobility, non-tender and anteverted              Adnexa: normal adnexa and no mass, fullness, tenderness               Rectovaginal: Confirms               Anus:  normal sphincter tone, no lesions  Chaperone present: yes  A:  Well Woman with normal exam  Post menopausal with vaginal dryness using vaseline external only  Hypertension, GERD and allergy management with PCP  BMD due  P:   Reviewed health and wellness pertinent to exam  Aware of need to advise if vaginal bleeding  Discussed coconut or Olive use for internal dryness if occurs.  Continue follow up with PCP as indicated and continue to work with diet for better health.  Discussed risks/benefits of BMD, would like to schedule with mammogram in fall. Order placed and patient can let them know she wants to schedule at same time. Discussed importance of calcium and vitamin  D in diet for good bone health.  Pap smear: no   counseled on breast self exam, mammography screening, feminine hygiene, osteoporosis, adequate intake of calcium and vitamin D, diet and exercise  return annually or prn  An After Visit Summary was printed and given to the patient.

## 2018-03-30 ENCOUNTER — Other Ambulatory Visit: Payer: Self-pay | Admitting: Certified Nurse Midwife

## 2018-03-30 DIAGNOSIS — Z1231 Encounter for screening mammogram for malignant neoplasm of breast: Secondary | ICD-10-CM

## 2018-05-02 ENCOUNTER — Encounter: Payer: Self-pay | Admitting: Nurse Practitioner

## 2018-05-02 ENCOUNTER — Other Ambulatory Visit (INDEPENDENT_AMBULATORY_CARE_PROVIDER_SITE_OTHER): Payer: PRIVATE HEALTH INSURANCE

## 2018-05-02 ENCOUNTER — Ambulatory Visit (INDEPENDENT_AMBULATORY_CARE_PROVIDER_SITE_OTHER): Payer: PRIVATE HEALTH INSURANCE | Admitting: Nurse Practitioner

## 2018-05-02 ENCOUNTER — Ambulatory Visit (HOSPITAL_COMMUNITY)
Admission: RE | Admit: 2018-05-02 | Discharge: 2018-05-02 | Disposition: A | Discharge status: Home / Self Care | Payer: PRIVATE HEALTH INSURANCE | Source: Ambulatory Visit | Attending: Family | Admitting: Family

## 2018-05-02 VITALS — BP 140/80 | HR 77 | Ht 61.75 in | Wt 245.0 lb

## 2018-05-02 DIAGNOSIS — M79605 Pain in left leg: Secondary | ICD-10-CM | POA: Insufficient documentation

## 2018-05-02 DIAGNOSIS — M7989 Other specified soft tissue disorders: Secondary | ICD-10-CM | POA: Insufficient documentation

## 2018-05-02 LAB — BASIC METABOLIC PANEL
BUN: 14 mg/dL (ref 6–23)
CALCIUM: 9.3 mg/dL (ref 8.4–10.5)
CO2: 31 mEq/L (ref 19–32)
Chloride: 102 mEq/L (ref 96–112)
Creatinine, Ser: 0.94 mg/dL (ref 0.40–1.20)
GFR: 77.11 mL/min (ref >60.00)
GLUCOSE: 98 mg/dL (ref 70–99)
POTASSIUM: 3.5 meq/L (ref 3.5–5.1)
SODIUM: 141 meq/L (ref 135–145)

## 2018-05-02 MED ORDER — IBUPROFEN 800 MG PO TABS: 800.0000 mg | ORAL_TABLET | Freq: Three times a day (TID) | ORAL | 0 refills | Status: DC | PRN

## 2018-05-02 NOTE — Progress Notes (Signed)
Name: Jennifer Quinn   MRN: [id number]    DOB: [birth year]   Date:05/02/2018       Progress Note  Subjective  Chief Complaint  Chief Complaint  Patient presents with  . Leg Pain    left x 3 days, no known injury    HPI [name] is here today for evaluation of an acute complaint of left leg pain, which first began about 3 days ago. She describes the pain as a heaviness in her left posterior knee and calf. The pain increases with standing and ambulation, decreases with rest. Denies fevers, weakness, falls, chest pain, warmth, redness, bruising, edema. She Does not recall any injury to the leg. She does work as a Marine scientist, standing for shifts, wears compression tights daily. No recent long car trips or flights, no history of blood clots. She has Tried epsom soaks, ibuprofen and tylenol with some temporary pain relief.  Patient Active Problem List   Diagnosis Date Noted  . Paresthesia 09/13/2016  . Nipple lesion 09/23/2015  . GERD (gastroesophageal reflux disease) 05/22/2015  . Chest pain, atypical 05/22/2015  . Well adult exam 12/12/2011  . Anxiety 12/12/2011  . Constipation 12/12/2011  . ELEVATED BLOOD PRESSURE 09/03/2010  . COLONIC POLYPS, HX OF 09/03/2010  . ALLERGIC RHINITIS 12/15/2009  . VERTIGO 04/25/2008  . Vitamin D deficiency 08/28/2007  . OBESITY, MORBID 08/28/2007  . Edema 08/28/2007  . WEIGHT GAIN 08/28/2007  . DEPRESSION 06/09/2007    Social History   Tobacco Use  . Smoking status: Former Research scientist (life sciences)  . Smokeless tobacco: Never Used  Substance Use Topics  . Alcohol use: Yes    Alcohol/week: 1.0 standard drinks    Types: 1 Standard drinks or equivalent per week     Current Outpatient Medications:  .  BLACK COHOSH PO, Take 50 mg by mouth daily. , Disp: , Rfl:  .  Cholecalciferol (VITAMIN D PO), Take 1,000 Units by mouth daily. , Disp: , Rfl:  .  [facility]S ALLERGY RELIEF-D 10-240 MG 24 hr tablet, TAKE 1 TABLET BY MOUTH EVERY DAY, Disp: 30 tablet, Rfl: 11 .  DEXILANT 30  MG capsule, TAKE 1 CAPSULE EVERY DAY, Disp: 90 capsule, Rfl: 2 .  diphenhydrAMINE (BENADRYL) 25 MG tablet, Take 2 tablets (50 mg total) by mouth every 4 (four) hours as needed for itching., Disp: 20 tablet, Rfl: 0 .  Fish Oil OIL, Take 2,000 mg by mouth daily.  , Disp: , Rfl:  .  hydrochlorothiazide (MICROZIDE) 12.5 MG capsule, Take 1 capsule (12.5 mg total) by mouth 2 (two) times daily., Disp: 180 capsule, Rfl: 2 .  KLOR-CON M20 20 MEQ tablet, TAKE 1 TABLET BY MOUTH EVERY DAY, Disp: 30 tablet, Rfl: 4 .  polyethylene glycol powder (GLYCOLAX/MIRALAX) powder, MIX 17 GRAMS IN LIQUID OF CHOICE AND DRINK ONCE DAILY AS NEEDED (Patient taking differently: MIX 17 GRAMS IN LIQUID OF CHOICE AND DRINK ONCE DAILY AS NEEDED FOR CONSTIPATION), Disp: 527 g, Rfl: 3 .  PROAIR HFA 108 (90 Base) MCG/ACT inhaler, Inhale 1-2 puffs into the lungs every 6 (six) hours as needed., Disp: 1 Inhaler, Rfl: 5 .  ranitidine (ZANTAC) 150 MG tablet, Take 1 tablet (150 mg total) by mouth 2 (two) times daily. x4 days, starting tonight (02/07/17) at bedtime, Disp: 10 tablet, Rfl: 0  No Known Allergies  ROS  No other specific complaints in a complete review of systems (except as listed in HPI above).  Objective  Vitals:   05/02/18 1039  BP: 140/80  Pulse: 77  SpO2: 96%  Weight: 245 lb (111.1 kg)  Height: 5' 1.75" (1.568 m)    Body mass index is 45.17 kg/m.  Nursing Note and Vital Signs reviewed.  Physical Exam   Constitutional: Patient appears well-developed and well-nourished.   No distress.  HEENT: head atraumatic, normocephalic, pupils equal and reactive to light, EOM's intact,   neck supple, oropharynx pink and moist without exudate Cardiovascular: Normal rate, regular rhythm, distal pulses intact. No BLE edema. Pulmonary/Chest: Effort normal, No respiratory distress or retractions. Musculoskeletal: Normal range of motion,  No gross deformities. Left lower extremity without tenderness, swelling. Neurological:  She is alert and oriented to person, place, and time. No cranial nerve deficit. Coordination, balance, strength, speech and gait are normal.  Skin: Skin is warm and dry. No rash noted. No erythema.  Psychiatric: Patient has a normal mood and affect. behavior is normal. Judgment and thought content normal.  Assessment & Plan  Pain of left lower extremity Could be MSK issue but will order Labs and diagnostic imaging today to rule out electrolyte imbalance, DVT -home management, Red flags and when to present for emergency care or RTC including fever >101.51F, chest pain, shortness of breath, new/worsening/un-resolving symptoms, reviewed with patient at time of visit. Follow up and care instructions discussed and provided in AVS. F/U with further recommendations pending test results - Basic metabolic panel; Future - D-Dimer, Quantitative; Future - VAS [place] LOWER EXTREMITY VENOUS (DVT); Future

## 2018-05-02 NOTE — Patient Instructions (Signed)
Please head downstairs for lab work. If any of your test results are critically abnormal, you will be contacted right away. Otherwise, I will contact you within a week about your test results and follow up recommendations  I have placed an order for ultrasound of your left leg. Our office will begin processing this referral. Please follow up if you have not heard anything about this referral by tomorrow.  Leg Cramps Leg cramps occur when a muscle or muscles tighten and you have no control over this tightening (involuntary muscle contraction). Muscle cramps can develop in any muscle, but the most common place is in the calf muscles of the leg. Those cramps can occur during exercise or when you are at rest. Leg cramps are painful, and they may last for a few seconds to a few minutes. Cramps may return several times before they finally stop. Usually, leg cramps are not caused by a serious medical problem. In many cases, the cause is not known. Some common causes include:  Overexertion.  Overuse from repetitive motions, or doing the same thing over and over.  Remaining in a certain position for a long period of time.  Improper preparation, form, or technique while performing a sport or an activity.  Dehydration.  Injury.  Side effects of some medicines.  Abnormally low levels of the salts and ions in your blood (electrolytes), especially potassium and calcium. These levels could be low if you are taking water pills (diuretics) or if you are pregnant.  Follow these instructions at home: Watch your condition for any changes. Taking the following actions may help to lessen any discomfort that you are feeling:  Stay well-hydrated. Drink enough fluid to keep your urine clear or pale yellow.  Try massaging, stretching, and relaxing the affected muscle. Do this for several minutes at a time.  For tight or tense muscles, use a warm towel, heating pad, or hot shower water directed to the  affected area.  If you are sore or have pain after a cramp, applying ice to the affected area may relieve discomfort. ? Put ice in a plastic bag. ? Place a towel between your skin and the bag. ? Leave the ice on for 20 minutes, 2-3 times per day.  Avoid strenuous exercise for several days if you have been having frequent leg cramps.  Make sure that your diet includes the essential minerals for your muscles to work normally.  Take medicines only as directed by your health care provider.  Contact a health care provider if:  Your leg cramps get more severe or more frequent, or they do not improve over time.  Your foot becomes cold, numb, or blue. This information is not intended to replace advice given to you by your health care provider. Make sure you discuss any questions you have with your health care provider. Document Released: 09/15/2004 Document Revised: 01/14/2016 Document Reviewed: 07/16/2014 Elsevier Interactive Patient Education  [employer].

## 2018-05-02 NOTE — Addendum Note (Signed)
Addended by: Cresenciano Lick on: 05/02/2018 01:51 PM   Modules accepted: Orders

## 2018-05-03 LAB — D-DIMER, QUANTITATIVE (NOT AT ARMC): D DIMER QUANT: 0.31 ug{FEU}/mL (ref <0.50)

## 2018-05-07 ENCOUNTER — Telehealth: Payer: Self-pay | Admitting: Internal Medicine

## 2018-05-07 NOTE — Telephone Encounter (Signed)
Copied from [place] [id number]. Topic: Quick Communication - Rx Refill/Question >> May 07, 2018 10:00 AM Marin Olp L wrote: Medication: Flexeril (left leg pain seen last week by [facility])  Has the patient contacted their pharmacy? Yes.   (Agent: If no, request that the patient contact the pharmacy for the refill.) (Agent: If yes, when and what did the pharmacy advise?)  Preferred Pharmacy (with phone number or street name): [id number] [place], [place] - [address] [place] [address] Phone: [phone] Fax: [phone]  Agent: Please be advised that RX refills may take up to 3 business days. We ask that you follow-up with your pharmacy.

## 2018-05-07 NOTE — Telephone Encounter (Signed)
Pt has never been prescribed this medication, pt will need OV with Dr. Alain Marion.

## 2018-05-08 NOTE — Telephone Encounter (Signed)
appt made

## 2018-05-09 ENCOUNTER — Encounter: Payer: Self-pay | Admitting: Internal Medicine

## 2018-05-09 ENCOUNTER — Encounter: Payer: PRIVATE HEALTH INSURANCE | Admitting: Internal Medicine

## 2018-05-09 ENCOUNTER — Ambulatory Visit (INDEPENDENT_AMBULATORY_CARE_PROVIDER_SITE_OTHER): Payer: PRIVATE HEALTH INSURANCE | Admitting: Internal Medicine

## 2018-05-09 ENCOUNTER — Ambulatory Visit (INDEPENDENT_AMBULATORY_CARE_PROVIDER_SITE_OTHER)
Admission: RE | Admit: 2018-05-09 | Discharge: 2018-05-09 | Disposition: A | Discharge status: Home / Self Care | Payer: PRIVATE HEALTH INSURANCE | Source: Ambulatory Visit | Attending: Internal Medicine | Admitting: Internal Medicine

## 2018-05-09 ENCOUNTER — Other Ambulatory Visit (INDEPENDENT_AMBULATORY_CARE_PROVIDER_SITE_OTHER): Payer: PRIVATE HEALTH INSURANCE

## 2018-05-09 VITALS — BP 144/76 | HR 58 | Temp 97.9°F | Ht 61.75 in | Wt 245.0 lb

## 2018-05-09 DIAGNOSIS — M25562 Pain in left knee: Secondary | ICD-10-CM

## 2018-05-09 DIAGNOSIS — Z0000 Encounter for general adult medical examination without abnormal findings: Secondary | ICD-10-CM

## 2018-05-09 DIAGNOSIS — R635 Abnormal weight gain: Secondary | ICD-10-CM

## 2018-05-09 LAB — HEPATIC FUNCTION PANEL
ALK PHOS: 56 U/L (ref 39–117)
ALT: 9 U/L (ref 0–35)
AST: 13 U/L (ref 0–37)
Albumin: 4.1 g/dL (ref 3.5–5.2)
BILIRUBIN TOTAL: 0.5 mg/dL (ref 0.2–1.2)
Bilirubin, Direct: 0.1 mg/dL (ref 0.0–0.3)
Total Protein: 6.9 g/dL (ref 6.0–8.3)

## 2018-05-09 LAB — CBC WITH DIFFERENTIAL/PLATELET
BASOS ABS: 0.1 10*3/uL (ref 0.0–0.1)
Basophils Relative: 1.9 % (ref 0.0–3.0)
Eosinophils Absolute: 0.2 10*3/uL (ref 0.0–0.7)
Eosinophils Relative: 3 % (ref 0.0–5.0)
HEMATOCRIT: 37.5 % (ref 36.0–46.0)
Hemoglobin: 12.5 g/dL (ref 12.0–15.0)
LYMPHS ABS: 2 10*3/uL (ref 0.7–4.0)
LYMPHS PCT: 35.2 % (ref 12.0–46.0)
MCHC: 33.2 g/dL (ref 30.0–36.0)
MCV: 82.6 fl (ref 78.0–100.0)
Monocytes Absolute: 0.4 10*3/uL (ref 0.1–1.0)
Monocytes Relative: 6.6 % (ref 3.0–12.0)
NEUTROS ABS: 3 10*3/uL (ref 1.4–7.7)
Neutrophils Relative %: 53.3 % (ref 43.0–77.0)
PLATELETS: 201 10*3/uL (ref 150.0–400.0)
RBC: 4.54 Mil/uL (ref 3.87–5.11)
RDW: 14.8 % (ref 11.5–15.5)
WBC: 5.7 10*3/uL (ref 4.0–10.5)

## 2018-05-09 LAB — LDL CHOLESTEROL, DIRECT: Direct LDL: 157 mg/dL

## 2018-05-09 LAB — LIPID PANEL
CHOLESTEROL: 211 mg/dL — AB (ref 0–200)
HDL: 34.5 mg/dL — AB (ref >39.00)
NonHDL: 176.77
Total CHOL/HDL Ratio: 6
Triglycerides: 238 mg/dL — ABNORMAL HIGH (ref 0.0–149.0)
VLDL: 47.6 mg/dL — AB (ref 0.0–40.0)

## 2018-05-09 LAB — URINALYSIS
Bilirubin Urine: NEGATIVE
Hgb urine dipstick: NEGATIVE
KETONES UR: NEGATIVE
Leukocytes, UA: NEGATIVE
Nitrite: NEGATIVE
SPECIFIC GRAVITY, URINE: 1.025 (ref 1.000–1.030)
Total Protein, Urine: NEGATIVE
URINE GLUCOSE: NEGATIVE
UROBILINOGEN UA: 0.2 (ref 0.0–1.0)
pH: 5.5 (ref 5.0–8.0)

## 2018-05-09 LAB — TSH: TSH: 1.85 u[IU]/mL (ref 0.35–4.50)

## 2018-05-09 NOTE — Assessment & Plan Note (Signed)
We discussed age appropriate health related issues, including available/recomended screening tests and vaccinations. We discussed a need for adhering to healthy diet and exercise. Labs ordered. All questions were answered. She has a GYN Dr Clotilde Dieter q 12 mo Colon up to date

## 2018-05-09 NOTE — Assessment & Plan Note (Signed)
Wt Readings from Last 3 Encounters:  05/09/18 245 lb (111.1 kg)  05/02/18 245 lb (111.1 kg)  02/23/18 244 lb (110.7 kg)

## 2018-05-09 NOTE — Progress Notes (Signed)
Subjective:  Patient ID: Jennifer Quinn, female    DOB: Oct 06, 1955  Age: 62 y.o. MRN: [id number]  CC: No chief complaint on file.   HPI Jennifer Quinn presents for a well exam C/o L knee pain x 1 week, stiff and painful  [place] - no DVT, no Baker cyst Working 110 h/wk and loving it!  Outpatient Medications Prior to Visit  Medication Sig Dispense Refill  . BLACK COHOSH PO Take 50 mg by mouth daily.     . Cholecalciferol (VITAMIN D PO) Take 1,000 Units by mouth daily.     . [facility]S ALLERGY RELIEF-D 10-240 MG 24 hr tablet TAKE 1 TABLET BY MOUTH EVERY DAY 30 tablet 11  . DEXILANT 30 MG capsule TAKE 1 CAPSULE EVERY DAY 90 capsule 2  . diphenhydrAMINE (BENADRYL) 25 MG tablet Take 2 tablets (50 mg total) by mouth every 4 (four) hours as needed for itching. 20 tablet 0  . Fish Oil OIL Take 2,000 mg by mouth daily.      . hydrochlorothiazide (MICROZIDE) 12.5 MG capsule Take 1 capsule (12.5 mg total) by mouth 2 (two) times daily. 180 capsule 2  . ibuprofen (ADVIL,MOTRIN) 800 MG tablet Take 1 tablet (800 mg total) by mouth every 8 (eight) hours as needed. 30 tablet 0  . KLOR-CON M20 20 MEQ tablet TAKE 1 TABLET BY MOUTH EVERY DAY 30 tablet 4  . polyethylene glycol powder (GLYCOLAX/MIRALAX) powder MIX 17 GRAMS IN LIQUID OF CHOICE AND DRINK ONCE DAILY AS NEEDED (Patient taking differently: MIX 17 GRAMS IN LIQUID OF CHOICE AND DRINK ONCE DAILY AS NEEDED FOR CONSTIPATION) 527 g 3  . PROAIR HFA 108 (90 Base) MCG/ACT inhaler Inhale 1-2 puffs into the lungs every 6 (six) hours as needed. 1 Inhaler 5  . ranitidine (ZANTAC) 150 MG tablet Take 1 tablet (150 mg total) by mouth 2 (two) times daily. x4 days, starting tonight (02/07/17) at bedtime 10 tablet 0   No facility-administered medications prior to visit.     ROS: Review of Systems  Constitutional: Positive for fatigue. Negative for activity change, appetite change, chills and unexpected weight change.  HENT: Negative for congestion, mouth sores and sinus  pressure.   Eyes: Negative for visual disturbance.  Respiratory: Negative for cough and chest tightness.   Gastrointestinal: Negative for abdominal pain and nausea.  Genitourinary: Negative for difficulty urinating, frequency and vaginal pain.  Musculoskeletal: Positive for arthralgias. Negative for back pain and gait problem.  Skin: Negative for pallor and rash.  Neurological: Negative for dizziness, tremors, weakness, numbness and headaches.  Psychiatric/Behavioral: Negative for confusion, sleep disturbance and suicidal ideas. The patient is nervous/anxious.     Objective:  BP (!) 144/76 (BP Location: Left Arm, Patient Position: Sitting, Cuff Size: Large)   Pulse (!) 58   Temp 97.9 F (36.6 C) (Oral)   Ht 5' 1.75" (1.568 m)   Wt 245 lb (111.1 kg)   SpO2 96%   BMI 45.17 kg/m   BP Readings from Last 3 Encounters:  05/09/18 (!) 144/76  05/02/18 140/80  02/23/18 116/62    Wt Readings from Last 3 Encounters:  05/09/18 245 lb (111.1 kg)  05/02/18 245 lb (111.1 kg)  02/23/18 244 lb (110.7 kg)    Physical Exam  Constitutional: She appears well-developed. No distress.  HENT:  Head: Normocephalic.  Right Ear: External ear normal.  Left Ear: External ear normal.  Nose: Nose normal.  Mouth/Throat: Oropharynx is clear and moist.  Eyes: Pupils are equal, round,  and reactive to light. Conjunctivae are normal. Right eye exhibits no discharge. Left eye exhibits no discharge.  Neck: Normal range of motion. Neck supple. No JVD present. No tracheal deviation present. No thyromegaly present.  Cardiovascular: Normal rate, regular rhythm and normal heart sounds.  Pulmonary/Chest: No stridor. No respiratory distress. She has no wheezes.  Abdominal: Soft. Bowel sounds are normal. She exhibits no distension and no mass. There is no tenderness. There is no rebound and no guarding.  Musculoskeletal: She exhibits tenderness. She exhibits no edema.  Lymphadenopathy:    She has no cervical  adenopathy.  Neurological: She displays normal reflexes. No cranial nerve deficit. She exhibits normal muscle tone. Coordination normal.  Skin: No rash noted. No erythema.  Psychiatric: She has a normal mood and affect. Her behavior is normal. Judgment and thought content normal.  Posterior L knee/calf a little tender Obese  Lab Results  Component Value Date   WBC 5.8 09/14/2016   HGB 13.1 09/14/2016   HCT 38.7 09/14/2016   PLT 238.0 09/14/2016   GLUCOSE 98 05/02/2018   CHOL 186 09/14/2016   TRIG 162.0 (H) 09/14/2016   HDL 36.50 (L) 09/14/2016   LDLDIRECT 149.1 12/11/2006   LDLCALC 117 (H) 09/14/2016   ALT 10 09/14/2016   AST 12 09/14/2016   NA 141 05/02/2018   K 3.5 05/02/2018   CL 102 05/02/2018   CREATININE 0.94 05/02/2018   BUN 14 05/02/2018   CO2 31 05/02/2018   TSH 3.44 09/14/2016   HGBA1C 6.1 09/14/2016    No results found.  Assessment & Plan:   There are no diagnoses linked to this encounter.   No orders of the defined types were placed in this encounter.    Follow-up: No follow-ups on file.  Walker Kehr, MD

## 2018-05-09 NOTE — Assessment & Plan Note (Signed)
Ref to Dr Leafy Ro

## 2018-05-09 NOTE — Assessment & Plan Note (Addendum)
L knee pain x 1 week, stiff and painful  [place] - no DVT, no Baker cyst Poss OA - Rx NSAIDs 50% better: cont w/Rx Ibuprofen Sports med ref if not 100% well X ray

## 2018-05-24 NOTE — Progress Notes (Signed)
Jennifer Quinn - 62 y.o. female MRN [id number]  Date of birth: 1955/09/13  SUBJECTIVE:  Including CC & ROS.  Chief Complaint  Patient presents with  . New Patient (Initial Visit)    L knee pain    Jennifer Quinn is a 62 y.o. female that is a new pt presenting w/ L knee pain x 3 weeks w/ no known MOI.  Pt rates the pain as a 3/10 sharp pain currently but can increase to an 8/10.  She reports that she has had swelling in her L knee but feels like it's not currently swollen.  She reports mechanical symptoms in her L knee.  Pt reports increased pain in the morning when she first stands up.  Pt is taking Tylenol arthritis which is helping her pain.  She has tried ice, heat and aspercreme.  She's had a L knee XR - 05/09/18.  Independent review of the left knee x-ray from 9/18 shows degenerative changes in the medial compartment.  Review of Systems  Constitutional: Negative for fever.  HENT: Negative for congestion.   Respiratory: Negative for cough.   Cardiovascular: Negative for chest pain.  Gastrointestinal: Negative for abdominal pain.  Musculoskeletal: Negative for gait problem.  Skin: Negative for color change.  Neurological: Negative for weakness.  Hematological: Negative for adenopathy.  Psychiatric/Behavioral: Negative for agitation.    HISTORY: Past Medical, Surgical, Social, and Family History Reviewed & Updated per EMR.   Pertinent Historical Findings include:  Past Medical History:  Diagnosis Date  . Allergy   . Anemia    past hx with pregnancies  . Bronchitis   . Fibroid   . GERD (gastroesophageal reflux disease)   . Hypertension     Past Surgical History:  Procedure Laterality Date  . COLONOSCOPY  2007, 01/25/2011. 05/25/16    No Known Allergies  Family History  Problem Relation Age of Onset  . Colon cancer Mother   . Diabetes Mother   . Breast cancer Sister   . Stroke Father   . Thyroid disease Daughter   . Multiple sclerosis Daughter      Social History    Socioeconomic History  . Marital status: Married    Spouse name: Not on file  . Number of children: Not on file  . Years of education: Not on file  . Highest education level: Not on file  Occupational History  . Not on file  Social Needs  . Financial resource strain: Not on file  . Food insecurity:    Worry: Not on file    Inability: Not on file  . Transportation needs:    Medical: Not on file    Non-medical: Not on file  Tobacco Use  . Smoking status: Former Research scientist (life sciences)  . Smokeless tobacco: Never Used  Substance and Sexual Activity  . Alcohol use: Yes    Alcohol/week: 1.0 standard drinks    Types: 1 Standard drinks or equivalent per week  . Drug use: No  . Sexual activity: Yes    Partners: Male    Birth control/protection: Post-menopausal  Lifestyle  . Physical activity:    Days per week: Not on file    Minutes per session: Not on file  . Stress: Not on file  Relationships  . Social connections:    Talks on phone: Not on file    Gets together: Not on file    Attends religious service: Not on file    Active member of club or organization: Not on file  Attends meetings of clubs or organizations: Not on file    Relationship status: Not on file  . Intimate partner violence:    Fear of current or ex partner: Not on file    Emotionally abused: Not on file    Physically abused: Not on file    Forced sexual activity: Not on file  Other Topics Concern  . Not on file  Social History Narrative  . Not on file     PHYSICAL EXAM:  VS: BP 140/74 (BP Location: Left Arm, Patient Position: Sitting, Cuff Size: Large)   Pulse 68   Ht 5' 1.75" (1.568 m)   Wt 243 lb 6.4 oz (110.4 kg)   SpO2 95%   BMI 44.88 kg/m  Physical Exam Gen: NAD, alert, cooperative with exam, well-appearing ENT: normal lips, normal nasal mucosa,  Eye: normal EOM, normal conjunctiva and lids CV:  no edema, +2 pedal pulses   Resp: no accessory muscle use, non-labored,  Skin: no rashes, no areas of  induration  Neuro: normal tone, normal sensation to touch Psych:  normal insight, alert and oriented MSK:  Left knee:  Knee: Normal to inspection with no erythema or effusion or obvious bony abnormalities. Palpation normal with no warmth, joint line tenderness, patellar tenderness, or condyle tenderness. ROM full in flexion and extension and lower leg rotation. Ligaments with solid consistent endpoints including ACL, PCL, LCL, MCL. Negative Mcmurray's, Apley's, and Thessalonian tests. Non painful patellar compression. Patellar glide without crepitus. Patellar and quadriceps tendons unremarkable. Hamstring and quadriceps strength is normal.  Neurovascularly intact    Aspiration/Injection Procedure Note Jennifer Quinn 06/03/1956  Procedure: Injection Indications: left knee pain   Procedure Details Consent: Risks of procedure as well as the alternatives and risks of each were explained to the (patient/caregiver).  Consent for procedure obtained. Time Out: Verified patient identification, verified procedure, site/side was marked, verified correct patient position, special equipment/implants available, medications/allergies/relevent history reviewed, required imaging and test results available.  Performed.  The area was cleaned with iodine and alcohol swabs.    The left knee superior lateral suprapatellar pouch was injected using 1 cc's of 40 mg Kenalog and 4 cc's of 1% bupivacaine with a 22 1 1/2" needle.  Ultrasound was used. Images were obtained in  Long views showing the injection.    A sterile dressing was applied.  Patient did tolerate procedure well.       ASSESSMENT & PLAN:   Knee pain, left Likely has component of degenerative changes and patellofemoral syndrome.  I do not appreciate any loose bodies on my independent review of the x-ray or on ultrasound -Injection today. -Pennsaid -Counseled on supportive care. -Counseled on home exercise therapy. -If no  improvement consider referral to physical therapy.

## 2018-05-25 ENCOUNTER — Ambulatory Visit: Payer: PRIVATE HEALTH INSURANCE | Admitting: Family Medicine

## 2018-05-25 ENCOUNTER — Encounter: Payer: Self-pay | Admitting: Family Medicine

## 2018-05-25 ENCOUNTER — Ambulatory Visit: Payer: Self-pay

## 2018-05-25 VITALS — BP 140/74 | HR 68 | Ht 61.75 in | Wt 243.4 lb

## 2018-05-25 DIAGNOSIS — M25562 Pain in left knee: Secondary | ICD-10-CM | POA: Diagnosis not present

## 2018-05-25 MED ORDER — DICLOFENAC SODIUM 2 % TD SOLN: 1.0000 "application " | Freq: Two times a day (BID) | TRANSDERMAL | 3 refills | Status: DC

## 2018-05-25 NOTE — Patient Instructions (Signed)
Nice to meet you  Please try to ice the knee if its painful  Please try compression  Please try the exercises  Please follow up with me in 3-4 weeks if the pain isn't improving.

## 2018-05-27 NOTE — Assessment & Plan Note (Addendum)
Likely has component of degenerative changes and patellofemoral syndrome.  I do not appreciate any loose bodies on my independent review of the x-ray or on ultrasound -Injection today. -Pennsaid -Counseled on supportive care. -Counseled on home exercise therapy. -If no improvement consider referral to physical therapy.

## 2018-06-07 ENCOUNTER — Ambulatory Visit
Admission: RE | Admit: 2018-06-07 | Discharge: 2018-06-07 | Disposition: A | Discharge status: Home / Self Care | Payer: PRIVATE HEALTH INSURANCE | Source: Ambulatory Visit | Attending: Certified Nurse Midwife | Admitting: Certified Nurse Midwife

## 2018-06-07 DIAGNOSIS — Z1231 Encounter for screening mammogram for malignant neoplasm of breast: Secondary | ICD-10-CM

## 2018-06-07 DIAGNOSIS — Z780 Asymptomatic menopausal state: Secondary | ICD-10-CM

## 2018-07-03 ENCOUNTER — Telehealth: Payer: Self-pay | Admitting: Certified Nurse Midwife

## 2018-07-03 ENCOUNTER — Other Ambulatory Visit: Payer: Self-pay | Admitting: Internal Medicine

## 2018-07-03 NOTE — Telephone Encounter (Signed)
Patient called stating that she started bleeding yesterday. Patient stated that it is more "like spotting and will have more every now and then." Denies any cramping. Patient stated that she is experiencing urgency and frequency.

## 2018-07-03 NOTE — Telephone Encounter (Signed)
Spoke with patient. Two days of what she describes as vaginal bleeding. Uses panty liners daily for urinary leakage and has noted blood on liners and toilet tissue after voiding. She states she feels like she is having more urinary urgency and frequency but feels like it does not feel like a UTI.  Post menopausal, no HRT.  Prior smoker with BMI 44.9.  Office visit with Dr. Talbert Nan scheduled for 07/04/18 for possible post menopausal bleeding.  Patient agreeable to office visit with Dr. Talbert Nan.  Encounter to Dr. Talbert Nan and closed.

## 2018-07-04 ENCOUNTER — Ambulatory Visit: Payer: PRIVATE HEALTH INSURANCE | Admitting: Obstetrics and Gynecology

## 2018-07-04 ENCOUNTER — Other Ambulatory Visit: Payer: Self-pay

## 2018-07-04 ENCOUNTER — Encounter: Payer: Self-pay | Admitting: Obstetrics and Gynecology

## 2018-07-04 VITALS — BP 132/78 | HR 60 | Wt 242.8 lb

## 2018-07-04 DIAGNOSIS — N950 Postmenopausal bleeding: Secondary | ICD-10-CM | POA: Diagnosis not present

## 2018-07-04 DIAGNOSIS — Z124 Encounter for screening for malignant neoplasm of cervix: Secondary | ICD-10-CM

## 2018-07-04 DIAGNOSIS — R350 Frequency of micturition: Secondary | ICD-10-CM

## 2018-07-04 LAB — POCT URINALYSIS DIPSTICK
Bilirubin, UA: NEGATIVE
Glucose, UA: NEGATIVE
Ketones, UA: NEGATIVE
NITRITE UA: NEGATIVE
PH UA: 5 (ref 5.0–8.0)
Protein, UA: NEGATIVE
RBC UA: POSITIVE
SPEC GRAV UA: 1.01 (ref 1.010–1.025)
UROBILINOGEN UA: 0.2 U/dL

## 2018-07-04 NOTE — Progress Notes (Addendum)
GYNECOLOGY  VISIT   HPI: 62 y.o.   Married Black or [place] American Not Hispanic or Latino  female   [phone] with No LMP recorded. Patient is postmenopausal.   here for vaginal spotting. Noticed blood on her pad on 07/02/2018 and 07/03/2018. Not having any bleeding today. Reports having urinary frequency and urgency off and on for the last few days. She has seen pink on her underwear and on the toilet paper. No dysuria, no pain. Denies any fever or chills. Sexually active, last active a few days prior to spotting.   Last pap 10/21/16: negative, negative hpv  GYNECOLOGIC HISTORY: No LMP recorded. Patient is postmenopausal. Contraception: Postmenopausal Menopausal hormone therapy: None        OB History    Gravida  4   Para  3   Term  3   Preterm      AB  1   Living  3     SAB  1   TAB      Ectopic      Multiple      Live Births                 Patient Active Problem List   Diagnosis Date Noted  . Knee pain, left 05/09/2018  . Paresthesia 09/13/2016  . Nipple lesion 09/23/2015  . GERD (gastroesophageal reflux disease) 05/22/2015  . Chest pain, atypical 05/22/2015  . Well adult exam 12/12/2011  . Anxiety 12/12/2011  . Constipation 12/12/2011  . ELEVATED BLOOD PRESSURE 09/03/2010  . COLONIC POLYPS, HX OF 09/03/2010  . ALLERGIC RHINITIS 12/15/2009  . VERTIGO 04/25/2008  . Vitamin D deficiency 08/28/2007  . OBESITY, MORBID 08/28/2007  . Edema 08/28/2007  . WEIGHT GAIN 08/28/2007  . DEPRESSION 06/09/2007    Past Medical History:  Diagnosis Date  . Allergy   . Anemia    past hx with pregnancies  . Bronchitis   . Fibroid   . GERD (gastroesophageal reflux disease)   . Hypertension     Past Surgical History:  Procedure Laterality Date  . COLONOSCOPY  2007, 01/25/2011. 05/25/16    Current Outpatient Medications  Medication Sig Dispense Refill  . BLACK COHOSH PO Take 50 mg by mouth daily.     . Cholecalciferol (VITAMIN D PO) Take 1,000 Units by mouth  daily.     . [facility] ALLERGY RELIEF-D 10-240 MG 24 hr tablet TAKE 1 TABLET BY MOUTH EVERY DAY 30 tablet 11  . DEXILANT 30 MG capsule TAKE 1 CAPSULE EVERY DAY 90 capsule 2  . Diclofenac Sodium (PENNSAID) 2 % SOLN Place 1 application onto the skin 2 (two) times daily. 1 Bottle 3  . diphenhydrAMINE (BENADRYL) 25 MG tablet Take 2 tablets (50 mg total) by mouth every 4 (four) hours as needed for itching. 20 tablet 0  . Fish Oil OIL Take 2,000 mg by mouth daily.      . hydrochlorothiazide (MICROZIDE) 12.5 MG capsule Take 1 capsule (12.5 mg total) by mouth 2 (two) times daily. 180 capsule 2  . hydrochlorothiazide (MICROZIDE) 12.5 MG capsule 1-2 po qd prn 60 capsule 3  . ibuprofen (ADVIL,MOTRIN) 800 MG tablet Take 1 tablet (800 mg total) by mouth every 8 (eight) hours as needed. 30 tablet 0  . KLOR-CON M20 20 MEQ tablet TAKE 1 TABLET BY MOUTH EVERY DAY 30 tablet 4  . polyethylene glycol powder (GLYCOLAX/MIRALAX) powder MIX 17 GRAMS IN LIQUID OF CHOICE AND DRINK ONCE DAILY AS NEEDED (Patient taking differently: MIX 17  GRAMS IN LIQUID OF CHOICE AND DRINK ONCE DAILY AS NEEDED FOR CONSTIPATION) 527 g 3  . PROAIR HFA 108 (90 Base) MCG/ACT inhaler Inhale 1-2 puffs into the lungs every 6 (six) hours as needed. 1 Inhaler 5  . ranitidine (ZANTAC) 150 MG tablet Take 1 tablet (150 mg total) by mouth 2 (two) times daily. x4 days, starting tonight (02/07/17) at bedtime 10 tablet 0   No current facility-administered medications for this visit.      ALLERGIES: Patient has no known allergies.  Family History  Problem Relation Age of Onset  . Colon cancer Mother   . Diabetes Mother   . Breast cancer Sister   . Stroke Father   . Thyroid disease Daughter   . Multiple sclerosis Daughter     Social History   Socioeconomic History  . Marital status: Married    Spouse name: Not on file  . Number of children: Not on file  . Years of education: Not on file  . Highest education level: Not on file  Occupational History   . Not on file  Social Needs  . Financial resource strain: Not on file  . Food insecurity:    Worry: Not on file    Inability: Not on file  . Transportation needs:    Medical: Not on file    Non-medical: Not on file  Tobacco Use  . Smoking status: Former Research scientist (life sciences)  . Smokeless tobacco: Never Used  Substance and Sexual Activity  . Alcohol use: Yes    Alcohol/week: 1.0 standard drinks    Types: 1 Standard drinks or equivalent per week  . Drug use: No  . Sexual activity: Yes    Partners: Male    Birth control/protection: Post-menopausal  Lifestyle  . Physical activity:    Days per week: Not on file    Minutes per session: Not on file  . Stress: Not on file  Relationships  . Social connections:    Talks on phone: Not on file    Gets together: Not on file    Attends religious service: Not on file    Active member of club or organization: Not on file    Attends meetings of clubs or organizations: Not on file    Relationship status: Not on file  . Intimate partner violence:    Fear of current or ex partner: Not on file    Emotionally abused: Not on file    Physically abused: Not on file    Forced sexual activity: Not on file  Other Topics Concern  . Not on file  Social History Narrative  . Not on file    Review of Systems  Constitutional: Negative.   HENT: Negative.   Eyes: Negative.   Respiratory: Negative.   Cardiovascular: Negative.   Gastrointestinal: Negative.   Genitourinary: Positive for frequency.       Vaginal bleeding  Musculoskeletal: Negative.   Skin: Negative.   Neurological: Negative.   Endo/Heme/Allergies: Negative.   Psychiatric/Behavioral: Negative.     PHYSICAL EXAMINATION:    BP 132/78 (BP Location: Right Arm, Patient Position: Sitting, Cuff Size: Normal)   Pulse 60   Wt 242 lb 12.8 oz (110.1 kg)   BMI 44.77 kg/m     General appearance: alert, cooperative and appears stated age Neck: no adenopathy, supple, symmetrical, trachea midline and  thyroid normal to inspection and palpation Abdomen: soft, non-tender; non distended, no masses,  no organomegaly  Pelvic: External genitalia:  no lesions  Urethra:  normal appearing urethra with no masses, tenderness or lesions              Bartholins and Skenes: normal                 Vagina: normal appearing vagina with normal color and discharge, no lesions              Cervix: no lesions and small amount of blood seen in the cervix              Bimanual Exam:  Uterus:  normal size, contour, position, consistency, mobility, non-tender and anteverted              Adnexa: no mass, fullness, tenderness              Rectovaginal: Yes.  .  Confirms.              Anus:  normal sphincter tone, no lesions  Chaperone was present for exam.  ASSESSMENT Postmenopausal bleeding Urinary frequency    PLAN Return for ultrasound, possible sonohysterogram, possible endometrial biopsy Urine for ua, c&s   An After Visit Summary was printed and given to the patient.  CC: [name], CNM  Addendum: pap with hpv done

## 2018-07-05 ENCOUNTER — Other Ambulatory Visit (HOSPITAL_COMMUNITY)
Admission: RE | Admit: 2018-07-05 | Discharge: 2018-07-05 | Disposition: A | Discharge status: Home / Self Care | Payer: PRIVATE HEALTH INSURANCE | Source: Ambulatory Visit | Attending: Obstetrics and Gynecology | Admitting: Obstetrics and Gynecology

## 2018-07-05 ENCOUNTER — Other Ambulatory Visit: Payer: Self-pay | Admitting: *Deleted

## 2018-07-05 DIAGNOSIS — N950 Postmenopausal bleeding: Secondary | ICD-10-CM

## 2018-07-05 DIAGNOSIS — Z124 Encounter for screening for malignant neoplasm of cervix: Secondary | ICD-10-CM | POA: Insufficient documentation

## 2018-07-05 LAB — URINALYSIS, MICROSCOPIC ONLY
Bacteria, UA: NONE SEEN
Casts: NONE SEEN /lpf
Epithelial Cells (non renal): >10 /hpf — AB (ref 0–10)

## 2018-07-05 NOTE — Addendum Note (Signed)
Addended by: Dorothy Spark on: 07/05/2018 05:02 PM   Modules accepted: Orders

## 2018-07-06 LAB — URINE CULTURE

## 2018-07-10 LAB — CYTOLOGY - PAP
Diagnosis: NEGATIVE
HPV (WINDOPATH): NOT DETECTED

## 2018-07-17 ENCOUNTER — Encounter: Payer: Self-pay | Admitting: Obstetrics and Gynecology

## 2018-07-17 ENCOUNTER — Telehealth: Payer: Self-pay | Admitting: Obstetrics and Gynecology

## 2018-07-17 ENCOUNTER — Other Ambulatory Visit: Payer: Self-pay | Admitting: Obstetrics and Gynecology

## 2018-07-17 ENCOUNTER — Ambulatory Visit: Payer: PRIVATE HEALTH INSURANCE | Admitting: Obstetrics and Gynecology

## 2018-07-17 ENCOUNTER — Other Ambulatory Visit: Payer: Self-pay

## 2018-07-17 ENCOUNTER — Ambulatory Visit (INDEPENDENT_AMBULATORY_CARE_PROVIDER_SITE_OTHER): Payer: PRIVATE HEALTH INSURANCE

## 2018-07-17 DIAGNOSIS — N950 Postmenopausal bleeding: Secondary | ICD-10-CM | POA: Diagnosis not present

## 2018-07-17 NOTE — Progress Notes (Addendum)
GYNECOLOGY  VISIT   HPI: 62 y.o.   Married Black or [place] American Not Hispanic or Latino  female   [phone] with No LMP recorded. Patient is postmenopausal.   here for consult following PUS. The patient presented on 11/13 with complaints of light vaginal bleeding. Her bleeding stopped after a day.   Pap from 07/05/18 negative with negative hpv  GYNECOLOGIC HISTORY: No LMP recorded. Patient is postmenopausal. Contraception: Postmenopausal Menopausal hormone therapy: None        OB History    Gravida  4   Para  3   Term  3   Preterm      AB  1   Living  3     SAB  1   TAB      Ectopic      Multiple      Live Births                 Patient Active Problem List   Diagnosis Date Noted  . Knee pain, left 05/09/2018  . Paresthesia 09/13/2016  . Nipple lesion 09/23/2015  . GERD (gastroesophageal reflux disease) 05/22/2015  . Chest pain, atypical 05/22/2015  . Well adult exam 12/12/2011  . Anxiety 12/12/2011  . Constipation 12/12/2011  . ELEVATED BLOOD PRESSURE 09/03/2010  . COLONIC POLYPS, HX OF 09/03/2010  . ALLERGIC RHINITIS 12/15/2009  . VERTIGO 04/25/2008  . Vitamin D deficiency 08/28/2007  . OBESITY, MORBID 08/28/2007  . Edema 08/28/2007  . WEIGHT GAIN 08/28/2007  . DEPRESSION 06/09/2007    Past Medical History:  Diagnosis Date  . Allergy   . Anemia    past hx with pregnancies  . Bronchitis   . Fibroid   . GERD (gastroesophageal reflux disease)   . Hypertension     Past Surgical History:  Procedure Laterality Date  . COLONOSCOPY  2007, 01/25/2011. 05/25/16    Current Outpatient Medications  Medication Sig Dispense Refill  . BLACK COHOSH PO Take 50 mg by mouth daily.     . Cholecalciferol (VITAMIN D PO) Take 1,000 Units by mouth daily.     . [facility] ALLERGY RELIEF-D 10-240 MG 24 hr tablet TAKE 1 TABLET BY MOUTH EVERY DAY 30 tablet 11  . DEXILANT 30 MG capsule TAKE 1 CAPSULE EVERY DAY 90 capsule 2  . Diclofenac Sodium (PENNSAID) 2 % SOLN  Place 1 application onto the skin 2 (two) times daily. 1 Bottle 3  . diphenhydrAMINE (BENADRYL) 25 MG tablet Take 2 tablets (50 mg total) by mouth every 4 (four) hours as needed for itching. 20 tablet 0  . Fish Oil OIL Take 2,000 mg by mouth daily.      . hydrochlorothiazide (MICROZIDE) 12.5 MG capsule Take 1 capsule (12.5 mg total) by mouth 2 (two) times daily. 180 capsule 2  . hydrochlorothiazide (MICROZIDE) 12.5 MG capsule 1-2 po qd prn 60 capsule 3  . ibuprofen (ADVIL,MOTRIN) 800 MG tablet Take 1 tablet (800 mg total) by mouth every 8 (eight) hours as needed. 30 tablet 0  . KLOR-CON M20 20 MEQ tablet TAKE 1 TABLET BY MOUTH EVERY DAY 30 tablet 4  . polyethylene glycol powder (GLYCOLAX/MIRALAX) powder MIX 17 GRAMS IN LIQUID OF CHOICE AND DRINK ONCE DAILY AS NEEDED (Patient taking differently: MIX 17 GRAMS IN LIQUID OF CHOICE AND DRINK ONCE DAILY AS NEEDED FOR CONSTIPATION) 527 g 3  . PROAIR HFA 108 (90 Base) MCG/ACT inhaler Inhale 1-2 puffs into the lungs every 6 (six) hours as needed. 1 Inhaler 5  .  ranitidine (ZANTAC) 150 MG tablet Take 1 tablet (150 mg total) by mouth 2 (two) times daily. x4 days, starting tonight (02/07/17) at bedtime 10 tablet 0   No current facility-administered medications for this visit.      ALLERGIES: Patient has no known allergies.  Family History  Problem Relation Age of Onset  . Colon cancer Mother   . Diabetes Mother   . Breast cancer Sister   . Stroke Father   . Thyroid disease Daughter   . Multiple sclerosis Daughter     Social History   Socioeconomic History  . Marital status: Married    Spouse name: Not on file  . Number of children: Not on file  . Years of education: Not on file  . Highest education level: Not on file  Occupational History  . Not on file  Social Needs  . Financial resource strain: Not on file  . Food insecurity:    Worry: Not on file    Inability: Not on file  . Transportation needs:    Medical: Not on file     Non-medical: Not on file  Tobacco Use  . Smoking status: Former Research scientist (life sciences)  . Smokeless tobacco: Never Used  Substance and Sexual Activity  . Alcohol use: Yes    Alcohol/week: 1.0 standard drinks    Types: 1 Standard drinks or equivalent per week  . Drug use: No  . Sexual activity: Yes    Partners: Male    Birth control/protection: Post-menopausal  Lifestyle  . Physical activity:    Days per week: Not on file    Minutes per session: Not on file  . Stress: Not on file  Relationships  . Social connections:    Talks on phone: Not on file    Gets together: Not on file    Attends religious service: Not on file    Active member of club or organization: Not on file    Attends meetings of clubs or organizations: Not on file    Relationship status: Not on file  . Intimate partner violence:    Fear of current or ex partner: Not on file    Emotionally abused: Not on file    Physically abused: Not on file    Forced sexual activity: Not on file  Other Topics Concern  . Not on file  Social History Narrative  . 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   HENT: Negative.   Eyes: Negative.   Respiratory: Negative.   Cardiovascular: Negative.   Gastrointestinal: Negative.   Genitourinary: Negative.   Musculoskeletal: Negative.   Skin: Negative.   Neurological: Negative.   Endo/Heme/Allergies: Negative.   Psychiatric/Behavioral: Negative.     PHYSICAL EXAMINATION:    BP 132/70 (BP Location: Right Arm, Patient Position: Sitting, Cuff Size: Large)   Pulse 60   Wt 240 lb (108.9 kg)   BMI 44.25 kg/m     General appearance: alert, cooperative and appears stated age Neck: no adenopathy, supple, symmetrical, trachea midline and thyroid normal to inspection and palpation  Heart: regular rate and rhythm Lungs: CTAB Abdomen: soft, non-tender; bowel sounds normal; no masses,  no organomegaly Extremities:  normal, atraumatic, no cyanosis Skin: normal color, texture and turgor, no rashes or lesions Lymph: normal cervical supraclavicular and inguinal nodes Neurologic: grossly normal    Pelvic: External genitalia:  no lesions  Urethra:  normal appearing urethra with no masses, tenderness or lesions              Bartholins and Skenes: normal                 Vagina: normal appearing vagina with normal color and discharge, no lesions              Cervix: no lesions  Sonohysterogram The procedure and risks of the procedure were reviewed with the patient, consent form was signed. A speculum was placed in the vagina and the cervix was cleansed with betadine. A tenaculum was placed on the cervix and the sonohysterogram catheter was inserted into the uterine cavity without difficulty. Saline was infused under direct observation with the ultrasound. The endometrium was irregular, possible sessile polyp.The catheter was removed.    Chaperone was present for exam.  ASSESSMENT PMP bleeding, thickened endometrium on ultrasound, irregular endometrium on sonohysterogram    PLAN Plan: hysteroscopy, dilation and curettage. Reviewed risks, including: bleeding, infection, uterine perforation, fluid overload, need for further sugery    An After Visit Summary was printed and given to the patient.  In addition to the sonohysterogram, ~15 minutes face to face time of which over 50% was spent in counseling.   CC: [name], CNM

## 2018-07-17 NOTE — Telephone Encounter (Signed)
Reviewed benefits for recommended surgery. Patient understands and is agreeable. NO PRE CERT IS REQUIRED.Patient to talk with her spouse first. Patient to call back to confirm and move forward with scheduling

## 2018-07-18 ENCOUNTER — Other Ambulatory Visit: Payer: Self-pay

## 2018-07-18 ENCOUNTER — Encounter (HOSPITAL_BASED_OUTPATIENT_CLINIC_OR_DEPARTMENT_OTHER): Payer: Self-pay | Admitting: *Deleted

## 2018-07-18 NOTE — Progress Notes (Addendum)
Spoke w/ pt via phone for pre-op interview.  Npo after mn.  Arrive at [facility] admission at 0900.  Needs CBC,BMP, and EKG.  Asked pt to bring ProAir inhaler with her day of surgery.  Pt verbalized understanding to purchase hibiclens from any pharmacy and shower with it from neck down with exception private area night before surgery and morning of surgery.

## 2018-07-18 NOTE — Telephone Encounter (Signed)
Patient returned call and has confirmed surgery scheduled for 07/23/18.   cc: Lamont Snowball, RN

## 2018-07-18 NOTE — Telephone Encounter (Signed)
Surgery instructions have been provided to patient. See previous encounter. Encounter closed.

## 2018-07-18 NOTE — Telephone Encounter (Signed)
Call placed to patient to follow up and confirm surgery on 07/23/18. Left voicemail message requesting a return call.   cc: Lamont Snowball, RN

## 2018-07-18 NOTE — H&P (Signed)
GYNECOLOGY  VISIT   HPI: 62 y.o.   Married Black or [place] American Not Hispanic or Latino  female   [phone] with No LMP recorded. Patient is postmenopausal.   here for consult following PUS. The patient presented on 11/13 with complaints of light vaginal bleeding. Her bleeding stopped after a day.   Pap from 07/05/18 negative with negative hpv  GYNECOLOGIC HISTORY: No LMP recorded. Patient is postmenopausal. Contraception: Postmenopausal Menopausal hormone therapy: None                OB History    Gravida  4   Para  3   Term  3   Preterm      AB  1   Living  3     SAB  1   TAB      Ectopic      Multiple      Live Births                     Patient Active Problem List   Diagnosis Date Noted  . Knee pain, left 05/09/2018  . Paresthesia 09/13/2016  . Nipple lesion 09/23/2015  . GERD (gastroesophageal reflux disease) 05/22/2015  . Chest pain, atypical 05/22/2015  . Well adult exam 12/12/2011  . Anxiety 12/12/2011  . Constipation 12/12/2011  . ELEVATED BLOOD PRESSURE 09/03/2010  . COLONIC POLYPS, HX OF 09/03/2010  . ALLERGIC RHINITIS 12/15/2009  . VERTIGO 04/25/2008  . Vitamin D deficiency 08/28/2007  . OBESITY, MORBID 08/28/2007  . Edema 08/28/2007  . WEIGHT GAIN 08/28/2007  . DEPRESSION 06/09/2007        Past Medical History:  Diagnosis Date  . Allergy   . Anemia    past hx with pregnancies  . Bronchitis   . Fibroid   . GERD (gastroesophageal reflux disease)   . Hypertension          Past Surgical History:  Procedure Laterality Date  . COLONOSCOPY  2007, 01/25/2011. 05/25/16          Current Outpatient Medications  Medication Sig Dispense Refill  . BLACK COHOSH PO Take 50 mg by mouth daily.     . Cholecalciferol (VITAMIN D PO) Take 1,000 Units by mouth daily.     . [facility] ALLERGY RELIEF-D 10-240 MG 24 hr tablet TAKE 1 TABLET BY MOUTH EVERY DAY 30 tablet 11  . DEXILANT 30 MG capsule TAKE 1 CAPSULE EVERY DAY  90 capsule 2  . Diclofenac Sodium (PENNSAID) 2 % SOLN Place 1 application onto the skin 2 (two) times daily. 1 Bottle 3  . diphenhydrAMINE (BENADRYL) 25 MG tablet Take 2 tablets (50 mg total) by mouth every 4 (four) hours as needed for itching. 20 tablet 0  . Fish Oil OIL Take 2,000 mg by mouth daily.      . hydrochlorothiazide (MICROZIDE) 12.5 MG capsule Take 1 capsule (12.5 mg total) by mouth 2 (two) times daily. 180 capsule 2  . hydrochlorothiazide (MICROZIDE) 12.5 MG capsule 1-2 po qd prn 60 capsule 3  . ibuprofen (ADVIL,MOTRIN) 800 MG tablet Take 1 tablet (800 mg total) by mouth every 8 (eight) hours as needed. 30 tablet 0  . KLOR-CON M20 20 MEQ tablet TAKE 1 TABLET BY MOUTH EVERY DAY 30 tablet 4  . polyethylene glycol powder (GLYCOLAX/MIRALAX) powder MIX 17 GRAMS IN LIQUID OF CHOICE AND DRINK ONCE DAILY AS NEEDED (Patient taking differently: MIX 17 GRAMS IN LIQUID OF CHOICE AND DRINK ONCE DAILY AS NEEDED FOR CONSTIPATION)  527 g 3  . PROAIR HFA 108 (90 Base) MCG/ACT inhaler Inhale 1-2 puffs into the lungs every 6 (six) hours as needed. 1 Inhaler 5  . ranitidine (ZANTAC) 150 MG tablet Take 1 tablet (150 mg total) by mouth 2 (two) times daily. x4 days, starting tonight (02/07/17) at bedtime 10 tablet 0   No current facility-administered medications for this visit.      ALLERGIES: Patient has no known allergies.       Family History  Problem Relation Age of Onset  . Colon cancer Mother   . Diabetes Mother   . Breast cancer Sister   . Stroke Father   . Thyroid disease Daughter   . Multiple sclerosis Daughter     Social History        Socioeconomic History  . Marital status: Married    Spouse name: Not on file  . Number of children: Not on file  . Years of education: Not on file  . Highest education level: Not on file  Occupational History  . Not on file  Social Needs  . Financial resource strain: Not on file  . Food insecurity:    Worry: Not on file     Inability: Not on file  . Transportation needs:    Medical: Not on file    Non-medical: Not on file  Tobacco Use  . Smoking status: Former Research scientist (life sciences)  . Smokeless tobacco: Never Used  Substance and Sexual Activity  . Alcohol use: Yes    Alcohol/week: 1.0 standard drinks    Types: 1 Standard drinks or equivalent per week  . Drug use: No  . Sexual activity: Yes    Partners: Male    Birth control/protection: Post-menopausal  Lifestyle  . Physical activity:    Days per week: Not on file    Minutes per session: Not on file  . Stress: Not on file  Relationships  . Social connections:    Talks on phone: Not on file    Gets together: Not on file    Attends religious service: Not on file    Active member of club or organization: Not on file    Attends meetings of clubs or organizations: Not on file    Relationship status: Not on file  . Intimate partner violence:    Fear of current or ex partner: Not on file    Emotionally abused: Not on file    Physically abused: Not on file    Forced sexual activity: Not on file  Other Topics Concern  . Not on file  Social History Narrative  . 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   HENT: Negative.   Eyes: Negative.   Respiratory: Negative.   Cardiovascular: Negative.   Gastrointestinal: Negative.   Genitourinary: Negative.   Musculoskeletal: Negative.   Skin: Negative.   Neurological: Negative.   Endo/Heme/Allergies: Negative.   Psychiatric/Behavioral: Negative.     PHYSICAL EXAMINATION:    BP 132/70 (BP Location: Right Arm, Patient Position: Sitting, Cuff Size: Large)   Pulse 60   Wt 240 lb (108.9 kg)   BMI 44.25 kg/m     General appearance: alert, cooperative and appears stated age Neck: no adenopathy, supple, symmetrical, trachea midline and thyroid normal to inspection and palpation  Heart: regular rate  and rhythm Lungs: CTAB Abdomen: soft, non-tender; bowel sounds normal; no masses,  no organomegaly Extremities: normal, atraumatic, no  cyanosis Skin: normal color, texture and turgor, no rashes or lesions Lymph: normal cervical supraclavicular and inguinal nodes Neurologic: grossly normal    Pelvic: External genitalia:  no lesions              Urethra:  normal appearing urethra with no masses, tenderness or lesions              Bartholins and Skenes: normal                 Vagina: normal appearing vagina with normal color and discharge, no lesions              Cervix: no lesions  Sonohysterogram The procedure and risks of the procedure were reviewed with the patient, consent form was signed. A speculum was placed in the vagina and the cervix was cleansed with betadine. A tenaculum was placed on the cervix and the sonohysterogram catheter was inserted into the uterine cavity without difficulty. Saline was infused under direct observation with the ultrasound. The endometrium was irregular, possible sessile polyp.The catheter was removed.    Chaperone was present for exam.  ASSESSMENT PMP bleeding, thickened endometrium on ultrasound, irregular endometrium on sonohysterogram    PLAN Plan: hysteroscopy, dilation and curettage. Reviewed risks, including: bleeding, infection, uterine perforation, fluid overload, need for further sugery

## 2018-07-23 ENCOUNTER — Ambulatory Visit (HOSPITAL_BASED_OUTPATIENT_CLINIC_OR_DEPARTMENT_OTHER): Payer: PRIVATE HEALTH INSURANCE | Admitting: Anesthesiology

## 2018-07-23 ENCOUNTER — Encounter (HOSPITAL_COMMUNITY): Payer: Self-pay | Admitting: Anesthesiology

## 2018-07-23 ENCOUNTER — Ambulatory Visit (HOSPITAL_BASED_OUTPATIENT_CLINIC_OR_DEPARTMENT_OTHER)
Admission: RE | Admit: 2018-07-23 | Discharge: 2018-07-23 | Disposition: A | Discharge status: Home / Self Care | Payer: PRIVATE HEALTH INSURANCE | Source: Ambulatory Visit | Attending: Obstetrics and Gynecology | Admitting: Obstetrics and Gynecology

## 2018-07-23 ENCOUNTER — Encounter (HOSPITAL_COMMUNITY)
Admission: RE | Disposition: A | Discharge status: Home / Self Care | Payer: Self-pay | Source: Ambulatory Visit | Attending: Obstetrics and Gynecology

## 2018-07-23 DIAGNOSIS — F329 Major depressive disorder, single episode, unspecified: Secondary | ICD-10-CM | POA: Diagnosis not present

## 2018-07-23 DIAGNOSIS — F419 Anxiety disorder, unspecified: Secondary | ICD-10-CM | POA: Insufficient documentation

## 2018-07-23 DIAGNOSIS — N840 Polyp of corpus uteri: Secondary | ICD-10-CM | POA: Diagnosis not present

## 2018-07-23 DIAGNOSIS — Z6841 Body Mass Index (BMI) 40.0 and over, adult: Secondary | ICD-10-CM | POA: Diagnosis not present

## 2018-07-23 DIAGNOSIS — N950 Postmenopausal bleeding: Secondary | ICD-10-CM | POA: Diagnosis not present

## 2018-07-23 DIAGNOSIS — Z87891 Personal history of nicotine dependence: Secondary | ICD-10-CM | POA: Diagnosis not present

## 2018-07-23 DIAGNOSIS — K219 Gastro-esophageal reflux disease without esophagitis: Secondary | ICD-10-CM | POA: Insufficient documentation

## 2018-07-23 DIAGNOSIS — N8500 Endometrial hyperplasia, unspecified: Secondary | ICD-10-CM | POA: Diagnosis not present

## 2018-07-23 HISTORY — PX: DILATATION & CURETTAGE/HYSTEROSCOPY WITH MYOSURE: SHX6511

## 2018-07-23 HISTORY — DX: Presence of dental prosthetic device (complete) (partial): Z97.2

## 2018-07-23 HISTORY — DX: Presence of spectacles and contact lenses: Z97.3

## 2018-07-23 HISTORY — DX: Personal history of diseases of the blood and blood-forming organs and certain disorders involving the immune mechanism: Z86.2

## 2018-07-23 HISTORY — DX: Vitamin D deficiency, unspecified: E55.9

## 2018-07-23 HISTORY — DX: Postmenopausal bleeding: N95.0

## 2018-07-23 HISTORY — DX: Other constipation: K59.09

## 2018-07-23 LAB — BASIC METABOLIC PANEL
ANION GAP: 9 (ref 5–15)
BUN: 13 mg/dL (ref 8–23)
CO2: 29 mmol/L (ref 22–32)
Calcium: 9.1 mg/dL (ref 8.9–10.3)
Chloride: 103 mmol/L (ref 98–111)
Creatinine, Ser: 0.91 mg/dL (ref 0.44–1.00)
GFR calc Af Amer: >60 mL/min (ref >60)
GLUCOSE: 102 mg/dL — AB (ref 70–99)
POTASSIUM: 4 mmol/L (ref 3.5–5.1)
Sodium: 141 mmol/L (ref 135–145)

## 2018-07-23 LAB — CBC
HEMATOCRIT: 41.4 % (ref 36.0–46.0)
HEMOGLOBIN: 13.1 g/dL (ref 12.0–15.0)
MCH: 27.2 pg (ref 26.0–34.0)
MCHC: 31.6 g/dL (ref 30.0–36.0)
MCV: 86.1 fL (ref 80.0–100.0)
NRBC: 0 % (ref 0.0–0.2)
PLATELETS: 228 10*3/uL (ref 150–400)
RBC: 4.81 MIL/uL (ref 3.87–5.11)
RDW: 14.2 % (ref 11.5–15.5)
WBC: 5.2 10*3/uL (ref 4.0–10.5)

## 2018-07-23 SURGERY — DILATATION & CURETTAGE/HYSTEROSCOPY WITH MYOSURE
Anesthesia: General

## 2018-07-23 MED ORDER — ONDANSETRON HCL 4 MG/2ML IJ SOLN
INTRAMUSCULAR | Status: AC
  Filled 2018-07-23: qty 2

## 2018-07-23 MED ORDER — DEXAMETHASONE SODIUM PHOSPHATE 10 MG/ML IJ SOLN
INTRAMUSCULAR | Status: DC | PRN
  Administered 2018-07-23: 4 mg via INTRAVENOUS

## 2018-07-23 MED ORDER — LACTATED RINGERS IV SOLN
INTRAVENOUS | Status: DC
  Administered 2018-07-23: 10:00:00 via INTRAVENOUS

## 2018-07-23 MED ORDER — PROPOFOL 10 MG/ML IV BOLUS
INTRAVENOUS | Status: DC | PRN
  Administered 2018-07-23: 180 mg via INTRAVENOUS

## 2018-07-23 MED ORDER — KETOROLAC TROMETHAMINE 30 MG/ML IJ SOLN
INTRAMUSCULAR | Status: DC | PRN
  Administered 2018-07-23: 30 mg via INTRAVENOUS

## 2018-07-23 MED ORDER — ONDANSETRON HCL 4 MG/2ML IJ SOLN
INTRAMUSCULAR | Status: DC | PRN
  Administered 2018-07-23: 4 mg via INTRAVENOUS

## 2018-07-23 MED ORDER — PROPOFOL 10 MG/ML IV BOLUS
INTRAVENOUS | Status: AC
  Filled 2018-07-23: qty 20

## 2018-07-23 MED ORDER — SODIUM CHLORIDE 0.9 % IV SOLN
INTRAVENOUS | Status: AC | PRN
  Administered 2018-07-23: 3000 mL via INTRAMUSCULAR

## 2018-07-23 MED ORDER — MIDAZOLAM HCL 2 MG/2ML IJ SOLN
INTRAMUSCULAR | Status: AC
  Filled 2018-07-23: qty 2

## 2018-07-23 MED ORDER — LIDOCAINE HCL (CARDIAC) PF 100 MG/5ML IV SOSY
PREFILLED_SYRINGE | INTRAVENOUS | Status: DC | PRN
  Administered 2018-07-23: 100 mg via INTRAVENOUS

## 2018-07-23 MED ORDER — FENTANYL CITRATE (PF) 100 MCG/2ML IJ SOLN
INTRAMUSCULAR | Status: AC
  Filled 2018-07-23: qty 2

## 2018-07-23 MED ORDER — KETOROLAC TROMETHAMINE 30 MG/ML IJ SOLN
INTRAMUSCULAR | Status: AC
  Filled 2018-07-23: qty 1

## 2018-07-23 MED ORDER — DEXAMETHASONE SODIUM PHOSPHATE 10 MG/ML IJ SOLN
INTRAMUSCULAR | Status: AC
  Filled 2018-07-23: qty 1

## 2018-07-23 MED ORDER — MIDAZOLAM HCL 5 MG/5ML IJ SOLN
INTRAMUSCULAR | Status: DC | PRN
  Administered 2018-07-23: 2 mg via INTRAVENOUS

## 2018-07-23 MED ORDER — FENTANYL CITRATE (PF) 100 MCG/2ML IJ SOLN
INTRAMUSCULAR | Status: DC | PRN
  Administered 2018-07-23: 100 ug via INTRAVENOUS

## 2018-07-23 SURGICAL SUPPLY — 21 items
CANISTER SUCT 3000ML PPV (MISCELLANEOUS) ×4 IMPLANT
CATH ROBINSON RED A/P 16FR (CATHETERS) IMPLANT
COVER WAND RF STERILE (DRAPES) ×2 IMPLANT
DEVICE MYOSURE LITE (MISCELLANEOUS) IMPLANT
DEVICE MYOSURE REACH (MISCELLANEOUS) ×1 IMPLANT
DILATOR CANAL MILEX (MISCELLANEOUS) IMPLANT
GAUZE 4X4 16PLY RFD (DISPOSABLE) ×2 IMPLANT
GLOVE BIO SURGEON STRL SZ 6.5 (GLOVE) ×2 IMPLANT
GOWN STRL REUS W/TWL LRG LVL3 (GOWN DISPOSABLE) ×2 IMPLANT
IV NS IRRIG 3000ML ARTHROMATIC (IV SOLUTION) ×2 IMPLANT
KIT PROCEDURE FLUENT (KITS) ×2 IMPLANT
KIT TURNOVER CYSTO (KITS) ×2 IMPLANT
MYOSURE XL FIBROID (MISCELLANEOUS)
PACK VAGINAL MINOR WOMEN LF (CUSTOM PROCEDURE TRAY) ×2 IMPLANT
PAD OB MATERNITY 4.3X12.25 (PERSONAL CARE ITEMS) ×2 IMPLANT
PAD PREP 24X48 CUFFED NSTRL (MISCELLANEOUS) ×2 IMPLANT
SEAL ROD LENS SCOPE MYOSURE (ABLATOR) ×2 IMPLANT
SYR 20CC LL (SYRINGE) IMPLANT
SYSTEM TISS REMOVAL MYOSURE XL (MISCELLANEOUS) IMPLANT
TOWEL OR 17X24 6PK STRL BLUE (TOWEL DISPOSABLE) ×4 IMPLANT
WATER STERILE IRR 500ML POUR (IV SOLUTION) IMPLANT

## 2018-07-23 NOTE — Interval H&P Note (Signed)
History and Physical Interval Note:  07/23/2018 9:50 AM  Jennifer Quinn  has presented today for surgery, with the diagnosis of PMB  The various methods of treatment have been discussed with the patient and family. After consideration of risks, benefits and other options for treatment, the patient has consented to  Procedure(s) with comments: Jennifer (N/A) - Follow 1st case. Use [facility] virtual OR in place of [facility] block time. as a surgical intervention .  The patient's history has been reviewed, patient examined, no change in status, stable for surgery.  I have reviewed the patient's chart and labs.  Questions were answered to the patient's satisfaction.     Salvadore Dom

## 2018-07-23 NOTE — Interval H&P Note (Signed)
History and Physical Interval Note:  07/23/2018 9:50 AM  Jennifer Quinn  has presented today for surgery, with the diagnosis of PMB  The various methods of treatment have been discussed with the patient and family. After consideration of risks, benefits and other options for treatment, the patient has consented to  Procedure(s) with comments: [place] (N/A) - Follow 1st case. Use [facility] virtual OR in place of [facility] block time. as a surgical intervention .  The patient's history has been reviewed, patient examined, no change in status, stable for surgery.  I have reviewed the patient's chart and labs.  Questions were answered to the patient's satisfaction.     Salvadore Dom

## 2018-07-23 NOTE — Discharge Instructions (Signed)
Post Anesthesia Home Care Instructions  Activity: Get plenty of rest for the remainder of the day. A responsible individual must stay with you for 24 hours following the procedure.  For the next 24 hours, DO NOT: -Drive a car -Paediatric nurse -Drink alcoholic beverages -Take any medication unless instructed by your physician -Make any legal decisions or sign important papers.  Meals: Start with liquid foods such as gelatin or soup. Progress to regular foods as tolerated. Avoid greasy, spicy, heavy foods. If nausea and/or vomiting occur, drink only clear liquids until the nausea and/or vomiting subsides. Call your physician if vomiting continues.  Special Instructions/Symptoms: Your throat may feel dry or sore from the anesthesia or the breathing tube placed in your throat during surgery. If this causes discomfort, gargle with warm salt water. The discomfort should disappear within 24 hours.  If you had a scopolamine patch placed behind your ear for the management of post- operative nausea and/or vomiting:  1. The medication in the patch is effective for 72 hours, after which it should be removed.  Wrap patch in a tissue and discard in the trash. Wash hands thoroughly with soap and water. 2. You may remove the patch earlier than 72 hours if you experience unpleasant side effects which may include dry mouth, dizziness or visual disturbances. 3. Avoid touching the patch. Wash your hands with soap and water after contact with the patch.    Dilation and Curettage or Vacuum Curettage, Care After These instructions give you information about caring for yourself after your procedure. Your doctor may also give you more specific instructions. Call your doctor if you have any problems or questions after your procedure. Follow these instructions at home: Activity  Do not drive or use heavy machinery while taking prescription pain medicine.  For 24 hours after your procedure, avoid  driving.  Take short walks often, followed by rest periods. Ask your doctor what activities are safe for you. After one or two days, you may be able to return to your normal activities.  Do not lift anything that is heavier than 10 lb (4.5 kg) until your doctor approves.  For at least 2 weeks, or as long as told by your doctor: ? Do not douche. ? Do not use tampons. ? Do not have sex. General instructions  Take over-the-counter and prescription medicines only as told by your doctor. This is very important if you take blood thinning medicine.  Do not take baths, swim, or use a hot tub until your doctor approves. Take showers instead of baths.  Wear compression stockings as told by your doctor.  It is up to you to get the results of your procedure. Ask your doctor when your results will be ready.  Keep all follow-up visits as told by your doctor. This is important. Contact a doctor if:  You have very bad cramps that get worse or do not get better with medicine.  You have very bad pain in your belly (abdomen).  You cannot drink fluids without throwing up (vomiting).  You get pain in a different part of the area between your belly and thighs (pelvis).  You have bad-smelling discharge from your vagina.  You have a rash. Get help right away if:  You are bleeding a lot from your vagina. A lot of bleeding means soaking more than one sanitary pad in an hour, for 2 hours in a row.  You have clumps of blood (blood clots) coming from your vagina.  You have  a fever or chills.  Your belly feels very tender or hard.  You have chest pain.  You have trouble breathing.  You cough up blood.  You feel dizzy.  You feel light-headed.  You pass out (faint).  You have pain in your neck or shoulder area. Summary  Take short walks often, followed by rest periods. Ask your doctor what activities are safe for you. After one or two days, you may be able to return to your normal  activities.  Do not lift anything that is heavier than 10 lb (4.5 kg) until your doctor approves.  Do not take baths, swim, or use a hot tub until your doctor approves. Take showers instead of baths.  Contact your doctor if you have any symptoms of infection, like bad-smelling discharge from your vagina. This information is not intended to replace advice given to you by your health care provider. Make sure you discuss any questions you have with your health care provider. Document Released: 05/17/2008 Document Revised: 04/25/2016 Document Reviewed: 04/25/2016 Elsevier Interactive Patient Education  2017 [employer].

## 2018-07-23 NOTE — Anesthesia Procedure Notes (Signed)
Procedure Name: LMA Insertion Date/Time: 07/23/2018 10:23 AM Performed by: Jonna Munro, CRNA Pre-anesthesia Checklist: Patient identified, Emergency Drugs available, Suction available, Patient being monitored and Timeout performed Patient Re-evaluated:Patient Re-evaluated prior to induction Oxygen Delivery Method: Circle system utilized Preoxygenation: Pre-oxygenation with 100% oxygen Induction Type: IV induction LMA: LMA inserted LMA Size: 4.0 Number of attempts: 1 Dental Injury: Teeth and Oropharynx as per pre-operative assessment

## 2018-07-23 NOTE — Anesthesia Postprocedure Evaluation (Signed)
Anesthesia Post Note  Patient: Jennifer Quinn  Procedure(s) Performed: DILATATION & CURETTAGE/HYSTEROSCOPY WITH MYOSURE (N/A )     Patient location during evaluation: PACU Anesthesia Type: General Level of consciousness: awake and alert Pain management: pain level controlled Vital Signs Assessment: post-procedure vital signs reviewed and stable Respiratory status: spontaneous breathing, nonlabored ventilation, respiratory function stable and patient connected to nasal cannula oxygen Cardiovascular status: blood pressure returned to baseline and stable Postop Assessment: no apparent nausea or vomiting Anesthetic complications: no    Last Vitals:  Vitals:   07/23/18 1130 07/23/18 1145  BP: 114/71 (!) 150/71  Pulse: (!) 48 (!) 59  Resp: 13 17  Temp:    SpO2: 91% 94%    Last Pain:  Vitals:   07/23/18 1200  TempSrc:   PainSc: 0-No pain                 Effie Berkshire

## 2018-07-23 NOTE — Op Note (Signed)
Preoperative Diagnosis: Postmenopausal bleeding, thickened endometrium  Postoperative Diagnosis: Postmenopausal bleeding, thickened endometrium, endometrial polyp  Procedure: Hysteroscopy, polypectomy, dilation and curettage  Surgeon: Dr Sumner Boast  Assistants: None  Anesthesia: General via LMA  EBL: 5 cc  Fluids: 800 cc LR  Fluid deficit: 55 cc  Urine output: not recorded  Indications for surgery: The patient is a 62 yo female, who presented with postmenopausal bleeding. Work up included an ultrasound that showed a thickened endometrium and a sonohysterogram that showed irregular endometrium with a possible sessile polyp.  The risks of the surgery were reviewed with the patient and the consent form was signed prior to her surgery.  Findings: EUA: top normal sized uterus, no adnexal masses. Hysteroscopy: thickened endometrium, small endometrial polyp. Normal tubal ostia bilaterally  Specimens: endometrial polyp, endometrial curettings   Procedure: The patient was taken to the operating room with an IV in place. She was placed in the dorsal lithotomy position and anesthesia was administered. She was prepped and draped in the usual sterile fashion for a vaginal procedure. She was I&O catheterized. A speculum was placed in the vagina and a single tooth tenaculum was placed on the anterior lip of the cervix. The cervix was dilated to a #7 hagar dilator. The uterus was sounded to 9 cm. The myosure hysteroscope was inserted into the uterine cavity. With continuous infusion of normal saline, the uterine cavity was visualized with the above findings. The myosure reach was used to resect the polyp and some of the thickened endometrium. The myosure was then removed. The cavity was then curetted with the small sharp curette. The cavity had the characteristically gritty texture at the end of the procedure. The curette and the single tooth tenaculum were removed. The speculum was removed. The  patients perineum was cleansed of betadine and she was taken out of the dorsal lithotomy position.  Upon awakening the LMA was removed and the patient was transferred to the recovery room in stable and awake condition.  The sponge and instrument count were correct. There were no complications.    CC: [name], CNM         Cassandria Anger, MD

## 2018-07-23 NOTE — Anesthesia Preprocedure Evaluation (Addendum)
Anesthesia Evaluation  Patient identified by MRN, date of birth, ID band Patient awake    Reviewed: Allergy & Precautions, NPO status , Patient's Chart, lab work & pertinent test results  Airway Mallampati: III  TM Distance: >3 FB Neck ROM: Full    Dental  (+) Dental Advisory Given, Missing,    Pulmonary asthma , former smoker,    breath sounds clear to auscultation       Cardiovascular hypertension, Pt. on medications  Rhythm:Regular Rate:Normal     Neuro/Psych Anxiety Depression    GI/Hepatic Neg liver ROS, GERD  Medicated,  Endo/Other  negative endocrine ROS  Renal/GU negative Renal ROS     Musculoskeletal negative musculoskeletal ROS (+)   Abdominal (+) + obese,   Peds  Hematology negative hematology ROS (+)   Anesthesia Other Findings   Reproductive/Obstetrics                            Anesthesia Physical Anesthesia Plan  ASA: III  Anesthesia Plan: General   Post-op Pain Management:    Induction: Intravenous  PONV Risk Score and Plan: 4 or greater and Ondansetron, Dexamethasone, Midazolam and Scopolamine patch - Pre-op  Airway Management Planned: LMA  Additional Equipment: None  Intra-op Plan:   Post-operative Plan: Extubation in OR  Informed Consent: I have reviewed the patients History and Physical, chart, labs and discussed the procedure including the risks, benefits and alternatives for the proposed anesthesia with the patient or authorized representative who has indicated his/her understanding and acceptance.     Plan Discussed with: CRNA  Anesthesia Plan Comments:       Anesthesia Quick Evaluation

## 2018-07-23 NOTE — Transfer of Care (Signed)
Immediate Anesthesia Transfer of Care Note  Patient: Jennifer Quinn  Procedure(s) Performed: DILATATION & CURETTAGE/HYSTEROSCOPY WITH MYOSURE (N/A )  Patient Location: PACU  Anesthesia Type:General  Level of Consciousness: awake, alert  and oriented  Airway & Oxygen Therapy: Patient Spontanous Breathing and Patient connected to face mask oxygen  Post-op Assessment: Report given to RN and Post -op Vital signs reviewed and stable  Post vital signs: Reviewed and stable  Last Vitals:  Vitals Value Taken Time  BP    Temp    Pulse 55 07/23/2018 11:05 AM  Resp 13 07/23/2018 11:05 AM  SpO2 100 % 07/23/2018 11:05 AM  Vitals shown include unvalidated device data.  Last Pain:  Vitals:   07/23/18 0926  TempSrc: Oral  PainSc: 1       Patients Stated Pain Goal: 5 (87/19/94 1290)  Complications: No apparent anesthesia complications

## 2018-07-24 ENCOUNTER — Telehealth: Payer: Self-pay | Admitting: Obstetrics and Gynecology

## 2018-07-24 ENCOUNTER — Encounter (HOSPITAL_BASED_OUTPATIENT_CLINIC_OR_DEPARTMENT_OTHER): Payer: Self-pay | Admitting: Obstetrics and Gynecology

## 2018-07-24 NOTE — Telephone Encounter (Signed)
Spoke with patient. Advised of message as seen below from [place]. Patient verbalizes understanding. Advised letter is ready to pick up. Patient is agreeable and will come by to pick up letter today. Encounter closed.

## 2018-07-24 NOTE — Telephone Encounter (Signed)
Patient states she had surgery yesterday and is requesting a return to work note for her employer? She said Dr.Jertson stated she could return to work in 24 hours after her surgery.

## 2018-07-24 NOTE — Telephone Encounter (Signed)
I didn't say 9, maybe fine or benign? Everything was benign. She is fine.

## 2018-07-24 NOTE — Telephone Encounter (Signed)
Spoke with patient. Patient is calling for pathology results. Results given as seen below from [place]. Patient verbalizes understanding. Requesting a return to work letter. "Dr.Jertson mentioned number nine on my voicemail. What is number nine?" Advised will review with Dr.Jertson and return call.  Letter to Dr.Jertson.

## 2018-08-05 ENCOUNTER — Other Ambulatory Visit: Payer: Self-pay | Admitting: Internal Medicine

## 2018-08-08 ENCOUNTER — Encounter (INDEPENDENT_AMBULATORY_CARE_PROVIDER_SITE_OTHER): Payer: PRIVATE HEALTH INSURANCE

## 2018-08-08 NOTE — Progress Notes (Signed)
GYNECOLOGY  VISIT   HPI: 62 y.o.   Married Black or [place] American Not Hispanic or Latino  female   [phone] with No LMP recorded. Patient is postmenopausal.   here for 2 week post op s/p hysteroscopy, polypectomy, D&C. Pathology was benign.  Would like to know when she can have intercourse again and if she can douche.  GYNECOLOGIC HISTORY: No LMP recorded. Patient is postmenopausal. Contraception: postmenopausal Menopausal hormone therapy: None        OB History    Gravida  4   Para  3   Term  3   Preterm      AB  1   Living  3     SAB  1   TAB      Ectopic      Multiple      Live Births                 Patient Active Problem List   Diagnosis Date Noted  . Knee pain, left 05/09/2018  . Paresthesia 09/13/2016  . Nipple lesion 09/23/2015  . GERD (gastroesophageal reflux disease) 05/22/2015  . Chest pain, atypical 05/22/2015  . Well adult exam 12/12/2011  . Anxiety 12/12/2011  . Constipation 12/12/2011  . ELEVATED BLOOD PRESSURE 09/03/2010  . COLONIC POLYPS, HX OF 09/03/2010  . ALLERGIC RHINITIS 12/15/2009  . VERTIGO 04/25/2008  . Vitamin D deficiency 08/28/2007  . OBESITY, MORBID 08/28/2007  . Edema 08/28/2007  . WEIGHT GAIN 08/28/2007  . DEPRESSION 06/09/2007    Past Medical History:  Diagnosis Date  . Asthma   . Chronic constipation   . Fibroid   . GERD (gastroesophageal reflux disease)    occ  . History of anemia   . Hypertension   . PMB (postmenopausal bleeding)   . Vitamin D deficiency   . Wears contact lenses   . Wears partial dentures    upper and lower    Past Surgical History:  Procedure Laterality Date  . COLONOSCOPY  2007, 01/25/2011. 05/25/16  . DILATATION & CURETTAGE/HYSTEROSCOPY WITH MYOSURE N/A 07/23/2018   Procedure: DILATATION & CURETTAGE/HYSTEROSCOPY WITH MYOSURE;  Surgeon: Salvadore Dom, MD;  Location: [facility];  Service: Gynecology;  Laterality: N/A;  Follow 1st case. Use [facility] virtual [facility] in place  of [facility] block time.    Current Outpatient Medications  Medication Sig Dispense Refill  . BLACK COHOSH PO Take 50 mg by mouth daily.     . Cholecalciferol (VITAMIN D PO) Take 1,000 Units by mouth daily.     . [employer] ALLERGY RELIEF-D 10-240 MG 24 hr tablet TAKE 1 TABLET BY MOUTH EVERY DAY (Patient taking differently: daily as needed. ) 30 tablet 11  . Diclofenac Sodium (PENNSAID) 2 % SOLN Place 1 application onto the skin 2 (two) times daily. (Patient taking differently: Place 1 application onto the skin 2 (two) times daily as needed. ) 1 Bottle 3  . diphenhydrAMINE (BENADRYL) 25 MG tablet Take 2 tablets (50 mg total) by mouth every 4 (four) hours as needed for itching. 20 tablet 0  . Fish Oil OIL Take 2,000 mg by mouth daily.      . hydrochlorothiazide (MICROZIDE) 12.5 MG capsule Take 1 capsule (12.5 mg total) by mouth 2 (two) times daily. (Patient taking differently: Take 12.5 mg by mouth 2 (two) times daily. ) 180 capsule 2  . KLOR-CON M20 20 MEQ tablet TAKE 1 TABLET BY MOUTH EVERY DAY 30 tablet 1  . omeprazole (PRILOSEC) 20  MG capsule Take 20 mg by mouth as needed.    . polyethylene glycol powder (GLYCOLAX/MIRALAX) powder MIX 17 GRAMS IN LIQUID OF CHOICE AND DRINK ONCE DAILY AS NEEDED (Patient taking differently: MIX 17 GRAMS IN LIQUID OF CHOICE AND DRINK ONCE DAILY AS NEEDED FOR CONSTIPATION) 527 g 3  . PROAIR HFA 108 (90 Base) MCG/ACT inhaler Inhale 1-2 puffs into the lungs every 6 (six) hours as needed. 1 Inhaler 5   No current facility-administered medications for this visit.      ALLERGIES: Patient has no known allergies.  Family History  Problem Relation Age of Onset  . Colon cancer Mother   . Diabetes Mother   . Breast cancer Sister   . Stroke Father   . Thyroid disease Daughter   . Multiple sclerosis Daughter     Social History   Socioeconomic History  . Marital status: Married    Spouse name: Not on file  . Number of children: Not on file  . Years of education: Not on  file  . Highest education level: Not on file  Occupational History  . Not on file  Social Needs  . Financial resource strain: Not on file  . Food insecurity:    Worry: Not on file    Inability: Not on file  . Transportation needs:    Medical: Not on file    Non-medical: Not on file  Tobacco Use  . Smoking status: Former Smoker    Years: 10.00    Types: Cigarettes    Last attempt to quit: 07/18/1985    Years since quitting: 33.0  . Smokeless tobacco: Never Used  Substance and Sexual Activity  . Alcohol use: Yes    Alcohol/week: 1.0 standard drinks    Types: 1 Standard drinks or equivalent per week    Comment: seldom  . Drug use: Never  . Sexual activity: Yes    Partners: Male    Birth control/protection: Post-menopausal  Lifestyle  . Physical activity:    Days per week: Not on file    Minutes per session: Not on file  . Stress: Not on file  Relationships  . Social connections:    Talks on phone: Not on file    Gets together: Not on file    Attends religious service: Not on file    Active member of club or organization: Not on file    Attends meetings of clubs or organizations: Not on file    Relationship status: Not on file  . Intimate partner violence:    Fear of current or ex partner: Not on file    Emotionally abused: Not on file    Physically abused: Not on file    Forced sexual activity: Not on file  Other Topics Concern  . Not on file  Social History Narrative  . Not on file    Review of Systems  Constitutional: Negative.   HENT: Negative.   Eyes: Negative.   Respiratory: Negative.   Cardiovascular: Negative.   Gastrointestinal: Negative.   Genitourinary: Negative.   Musculoskeletal: Negative.   Skin: Negative.   Neurological: Negative.   Endo/Heme/Allergies: Negative.   Psychiatric/Behavioral: Negative.     PHYSICAL EXAMINATION:    BP 124/82 (BP Location: Right Arm, Patient Position: Sitting, Cuff Size: Large)   Pulse 60   Wt 244 lb 6.4 oz  (110.9 kg)   BMI 44.70 kg/m     General appearance: alert, cooperative and appears stated age Abdomen: soft, non-tender; non distended, no  masses,  no organomegaly    ASSESSMENT 2 weeks s/p hysteroscopy, polypectomy, D&C. Pathology was benign    PLAN Routine f/u   An After Visit Summary was printed and given to the patient.  CC: [name], CNM

## 2018-08-09 ENCOUNTER — Ambulatory Visit (INDEPENDENT_AMBULATORY_CARE_PROVIDER_SITE_OTHER): Payer: PRIVATE HEALTH INSURANCE | Admitting: Obstetrics and Gynecology

## 2018-08-09 ENCOUNTER — Other Ambulatory Visit: Payer: Self-pay

## 2018-08-09 ENCOUNTER — Encounter: Payer: Self-pay | Admitting: Obstetrics and Gynecology

## 2018-08-09 VITALS — BP 124/82 | HR 60 | Wt 244.4 lb

## 2018-08-09 DIAGNOSIS — Z9889 Other specified postprocedural states: Secondary | ICD-10-CM

## 2018-09-04 ENCOUNTER — Ambulatory Visit (INDEPENDENT_AMBULATORY_CARE_PROVIDER_SITE_OTHER): Payer: PRIVATE HEALTH INSURANCE | Admitting: Family Medicine

## 2018-09-04 ENCOUNTER — Encounter (INDEPENDENT_AMBULATORY_CARE_PROVIDER_SITE_OTHER): Payer: Self-pay | Admitting: Family Medicine

## 2018-09-04 VITALS — BP 134/77 | HR 69 | Temp 97.8°F | Ht 61.0 in | Wt 237.0 lb

## 2018-09-04 DIAGNOSIS — R7303 Prediabetes: Secondary | ICD-10-CM

## 2018-09-04 DIAGNOSIS — Z9189 Other specified personal risk factors, not elsewhere classified: Secondary | ICD-10-CM

## 2018-09-04 DIAGNOSIS — E559 Vitamin D deficiency, unspecified: Secondary | ICD-10-CM

## 2018-09-04 DIAGNOSIS — R0602 Shortness of breath: Secondary | ICD-10-CM

## 2018-09-04 DIAGNOSIS — Z6841 Body Mass Index (BMI) 40.0 and over, adult: Secondary | ICD-10-CM

## 2018-09-04 DIAGNOSIS — E66813 Obesity, class 3: Secondary | ICD-10-CM

## 2018-09-04 DIAGNOSIS — R5383 Other fatigue: Secondary | ICD-10-CM | POA: Diagnosis not present

## 2018-09-04 DIAGNOSIS — Z0289 Encounter for other administrative examinations: Secondary | ICD-10-CM

## 2018-09-04 DIAGNOSIS — Z1331 Encounter for screening for depression: Secondary | ICD-10-CM | POA: Diagnosis not present

## 2018-09-05 LAB — COMPREHENSIVE METABOLIC PANEL
ALT: 10 IU/L (ref 0–32)
AST: 14 IU/L (ref 0–40)
Albumin/Globulin Ratio: 1.7 (ref 1.2–2.2)
Albumin: 4.1 g/dL (ref 3.6–4.8)
Alkaline Phosphatase: 63 IU/L (ref 39–117)
BUN/Creatinine Ratio: 13 (ref 12–28)
BUN: 13 mg/dL (ref 8–27)
Bilirubin Total: 0.6 mg/dL (ref 0.0–1.2)
CO2: 25 mmol/L (ref 20–29)
CREATININE: 1.02 mg/dL — AB (ref 0.57–1.00)
Calcium: 9 mg/dL (ref 8.7–10.3)
Chloride: 99 mmol/L (ref 96–106)
GFR calc Af Amer: 67 mL/min/{1.73_m2} (ref >59)
GFR calc non Af Amer: 58 mL/min/{1.73_m2} — ABNORMAL LOW (ref >59)
Globulin, Total: 2.4 g/dL (ref 1.5–4.5)
Glucose: 86 mg/dL (ref 65–99)
Potassium: 3.8 mmol/L (ref 3.5–5.2)
Sodium: 141 mmol/L (ref 134–144)
Total Protein: 6.5 g/dL (ref 6.0–8.5)

## 2018-09-05 LAB — HEMOGLOBIN A1C
Est. average glucose Bld gHb Est-mCnc: 126 mg/dL
Hgb A1c MFr Bld: 6 % — ABNORMAL HIGH (ref 4.8–5.6)

## 2018-09-05 LAB — VITAMIN B12: Vitamin B-12: 1119 pg/mL (ref 232–1245)

## 2018-09-05 LAB — LIPID PANEL WITH LDL/HDL RATIO
CHOLESTEROL TOTAL: 182 mg/dL (ref 100–199)
HDL: 36 mg/dL — ABNORMAL LOW (ref >39)
LDL Calculated: 125 mg/dL — ABNORMAL HIGH (ref 0–99)
LDl/HDL Ratio: 3.5 ratio — ABNORMAL HIGH (ref 0.0–3.2)
Triglycerides: 103 mg/dL (ref 0–149)
VLDL Cholesterol Cal: 21 mg/dL (ref 5–40)

## 2018-09-05 LAB — INSULIN, RANDOM: INSULIN: 6.4 u[IU]/mL (ref 2.6–24.9)

## 2018-09-05 LAB — T3: T3, Total: 99 ng/dL (ref 71–180)

## 2018-09-05 LAB — FOLATE: Folate: 13.7 ng/mL (ref >3.0)

## 2018-09-05 LAB — T4, FREE: Free T4: 1.01 ng/dL (ref 0.82–1.77)

## 2018-09-05 LAB — VITAMIN D 25 HYDROXY (VIT D DEFICIENCY, FRACTURES): Vit D, 25-Hydroxy: 35 ng/mL (ref 30.0–100.0)

## 2018-09-06 NOTE — Progress Notes (Signed)
.  Office: [phone]  /  Fax: [phone]   HPI:   Chief Complaint: OBESITY  Jennifer Quinn (MR# [id number]) is a 63 y.o. female who presents on 09/06/2018 for obesity evaluation and treatment. Current BMI is Body mass index is 44.78 kg/m.Marland Kitchen Jennifer Quinn has struggled with obesity for years and has been unsuccessful in either losing weight or maintaining long term weight loss. Jennifer Quinn attended our information session and states she is currently in the action stage of change and ready to dedicate time achieving and maintaining a healthier weight.   Jennifer Quinn heard about our clinic from [place]. She previously tried [facility] on [address] [employer].  Jennifer Quinn states her family eats meals together she thinks her family will eat healthier with  her her heaviest weight ever was 243 lbs she is a picky eater and doesn't like to eat healthier foods  she has significant food cravings issues  she snacks frequently in the evenings she skips meals frequently she is frequently drinking liquids with calories she frequently makes poor food choices she frequently eats larger portions than normal  she has binge eating behaviors she struggles with emotional eating    Fatigue Jennifer Quinn feels her energy is lower than it should be. This has worsened with weight gain and has not worsened recently. Jennifer Quinn admits to daytime somnolence and  denies waking up still tired. Patient is at risk for obstructive sleep apnea. Patent has a history of symptoms of daytime fatigue and morning headache. Patient generally gets 6 hours of sleep per night, and states they generally have generally restful sleep. Snoring is present. Apneic episodes are not present. Epworth Sleepiness Score is 11.  Dyspnea on exertion Jennifer Quinn notes increasing shortness of breath with exercising and seems to be worsening over time with weight gain. She notes getting out of breath sooner with activity than she used to. This  has not gotten worse recently. Jennifer Quinn denies orthopnea.  Pre-Diabetes Jennifer Quinn has a diagnosis of pre-diabetes based on her elevated Hgb A1c at 6.4 in 2015, and was informed this puts her at greater risk of developing diabetes. She is not taking metformin currently and she notes carbohydrate cravings. She continues to work on diet and exercise to decrease risk of diabetes.   At risk for diabetes Jennifer Quinn is at higher than average risk for developing diabetes due to her obesity and pre-diabetes. She currently denies polyuria or polydipsia.  Vitamin D Deficiency Jennifer Quinn has a diagnosis of vitamin D deficiency. She was previously on prescription Vit D 50,000 IU weekly. She notes fatigue and denies nausea, vomiting or muscle weakness.  Depression Screen Jennifer Quinn's Food and Mood (modified PHQ-9) score was  Depression screen PHQ 2/9 09/04/2018  Decreased Interest 1  Down, Depressed, Hopeless 0  PHQ - 2 Score 1  Altered sleeping 1  Tired, decreased energy 1  Change in appetite 1  Feeling bad or failure about yourself  0  Trouble concentrating 1  Moving slowly or fidgety/restless 1  Suicidal thoughts 0  PHQ-9 Score 6  Difficult doing work/chores Not difficult at all    ASSESSMENT AND PLAN:  Other fatigue - Plan: Vitamin B12, Folate, VITAMIN D 25 Hydroxy (Vit-D Deficiency, Fractures), T3, T4, free  Shortness of breath on exertion - Plan: Lipid Panel With LDL/HDL Ratio  Prediabetes - Plan: Comprehensive metabolic panel, Hemoglobin A1c, Insulin, random  Vitamin D deficiency - Plan: VITAMIN D 25 Hydroxy (Vit-D Deficiency, Fractures)  Depression screening  At risk for diabetes mellitus  Class 3 severe obesity with serious comorbidity and body mass index (BMI) of 40.0 to 44.9 in adult, unspecified obesity type ([place])  PLAN:  Fatigue Jennifer Quinn was informed that her fatigue may be related to obesity, depression or many other causes. Labs will be ordered, and in the meanwhile Berdia has agreed to  work on diet, exercise and weight loss to help with fatigue. Proper sleep hygiene was discussed including the need for 7-8 hours of quality sleep each night. A sleep study was not ordered based on symptoms and Epworth score.  Dyspnea on exertion Jennifer Quinn's shortness of breath appears to be obesity related and exercise induced. She has agreed to work on weight loss and gradually increase exercise to treat her exercise induced shortness of breath. If Jennifer Quinn follows our instructions and loses weight without improvement of her shortness of breath, we will plan to refer to pulmonology. We will monitor this condition regularly. Jennifer Quinn agrees to this plan.  Pre-Diabetes Jennifer Quinn will continue to work on weight loss, exercise, and decreasing simple carbohydrates in her diet to help decrease the risk of diabetes. We dicussed metformin including benefits and risks. She was informed that eating too many simple carbohydrates or too many calories at one sitting increases the likelihood of GI side effects. Jennifer Quinn declined metformin for now and a prescription was not written today. We will check Hgb A1c and insulin today. Jennifer Quinn agrees to follow up with our clinic in 2 weeks as directed to monitor her progress.  Diabetes risk counselling Jennifer Quinn was given extended (15 minutes) diabetes prevention counseling today. She is 63 y.o. female and has risk factors for diabetes including obesity and pre-diabetes. We discussed intensive lifestyle modifications today with an emphasis on weight loss as well as increasing exercise and decreasing simple carbohydrates in her diet.  Vitamin D Deficiency Jennifer Quinn was informed that low vitamin D levels contributes to fatigue and are associated with obesity, breast, and colon cancer. She will follow up for routine testing of vitamin D, at least 2-3 times per year. She was informed of the risk of over-replacement of vitamin D and agrees to not increase her dose unless she discusses this with [place]  first. We will check Vit D level today. Jennifer Quinn agrees to follow up with our clinic in 2 weeks.  Depression Screen Jennifer Quinn had a mildly positive depression screening. Depression is commonly associated with obesity and often results in emotional eating behaviors. We will monitor this closely and work on CBT to help improve the non-hunger eating patterns. Referral to Psychology may be required if no improvement is seen as she continues in our clinic.  Obesity Jennifer Quinn is currently in the action stage of change and her goal is to continue with weight loss efforts She has agreed to follow the Category 3 plan Jennifer Quinn has been instructed to work up to a goal of 150 minutes of combined cardio and strengthening exercise per week for weight loss and overall health benefits. We discussed the following Behavioral Modification Strategies today: increasing lean protein intake, increasing vegetables, work on meal planning and easy cooking plans, dealing with family or coworker sabotage and avoiding temptations  Kaysea has agreed to follow up with our clinic in 2 weeks. She was informed of the importance of frequent follow up visits to maximize her success with intensive lifestyle modifications for her multiple health conditions. She was informed we would discuss her lab results at her next visit unless there is a critical issue that needs to be addressed sooner. Jennifer Quinn  agreed to keep her next visit at the agreed upon time to discuss these results.  ALLERGIES: No Known Allergies  MEDICATIONS: Current Outpatient Medications on File Prior to Visit  Medication Sig Dispense Refill  . Cholecalciferol (VITAMIN D PO) Take 1,000 Units by mouth daily.     . Fish Oil OIL Take 2,000 mg by mouth daily.      . hydrochlorothiazide (MICROZIDE) 12.5 MG capsule Take 1 capsule (12.5 mg total) by mouth 2 (two) times daily. (Patient taking differently: Take 12.5 mg by mouth 2 (two) times daily. ) 180 capsule 2  . KLOR-CON M20 20 MEQ  tablet TAKE 1 TABLET BY MOUTH EVERY DAY 30 tablet 1   No current facility-administered medications on file prior to visit.     PAST MEDICAL HISTORY: Past Medical History:  Diagnosis Date  . Asthma   . Chest pain   . Chronic constipation   . Depression   . Fibroid   . GERD (gastroesophageal reflux disease)    occ  . History of anemia   . Hypertension   . PMB (postmenopausal bleeding)   . Vitamin D deficiency   . Wears contact lenses   . Wears partial dentures    upper and lower    PAST SURGICAL HISTORY: Past Surgical History:  Procedure Laterality Date  . COLONOSCOPY  2007, 01/25/2011. 05/25/16  . DILATATION & CURETTAGE/HYSTEROSCOPY WITH MYOSURE N/A 07/23/2018   Procedure: DILATATION & CURETTAGE/HYSTEROSCOPY WITH MYOSURE;  Surgeon: Salvadore Dom, MD;  Location: [facility];  Service: Gynecology;  Laterality: N/A;  Follow 1st case. Use [facility] of [facility] block time.    SOCIAL HISTORY: Social History   Tobacco Use  . Smoking status: Former Smoker    Years: 10.00    Types: Cigarettes    Last attempt to quit: 07/18/1985    Years since quitting: 33.1  . Smokeless tobacco: Never Used  Substance Use Topics  . Alcohol use: Yes    Alcohol/week: 1.0 standard drinks    Types: 1 Standard drinks or equivalent per week    Comment: seldom  . Drug use: Never    FAMILY HISTORY: Family History  Problem Relation Age of Onset  . Colon cancer Mother   . Diabetes Mother   . Obesity Mother   . Breast cancer Sister   . Stroke Father   . Alcoholism Father   . Thyroid disease Daughter   . Multiple sclerosis Daughter     ROS: Review of Systems  Constitutional: Positive for malaise/fatigue. Negative for weight loss.  Respiratory: Positive for shortness of breath (with exertion).   Cardiovascular: Negative for orthopnea.  Gastrointestinal: Negative for nausea and vomiting.  Genitourinary: Negative for frequency.  Musculoskeletal:       Negative  muscle weakness  Endo/Heme/Allergies: Negative for polydipsia.    PHYSICAL EXAM: Blood pressure 134/77, pulse 69, temperature 97.8 F (36.6 C), temperature source Oral, height 5\' 1"  (1.549 m), weight 237 lb (107.5 kg), SpO2 94 %. Body mass index is 44.78 kg/m. Physical Exam Vitals signs reviewed.  Constitutional:      Appearance: Normal appearance. She is obese.  HENT:     Head: Normocephalic and atraumatic.     Nose: Nose normal.  Eyes:     General: No scleral icterus.    Extraocular Movements: Extraocular movements intact.  Neck:     Musculoskeletal: Normal range of motion and neck supple.     Comments: No thyromegaly present Cardiovascular:  Rate and Rhythm: Normal rate and regular rhythm.     Pulses: Normal pulses.     Heart sounds: Normal heart sounds.  Pulmonary:     Effort: Pulmonary effort is normal. No respiratory distress.     Breath sounds: Normal breath sounds.  Abdominal:     Palpations: Abdomen is soft.     Tenderness: There is no abdominal tenderness.     Comments: + Obesity  Musculoskeletal: Normal range of motion.     Right lower leg: No edema.     Left lower leg: No edema.  Skin:    General: Skin is warm and dry.  Neurological:     Mental Status: She is alert and oriented to person, place, and time.     Coordination: Coordination normal.  Psychiatric:        Mood and Affect: Mood normal.        Behavior: Behavior normal.     RECENT LABS AND TESTS: BMET    Component Value Date/Time   NA 141 09/04/2018 1223   K 3.8 09/04/2018 1223   CL 99 09/04/2018 1223   CO2 25 09/04/2018 1223   GLUCOSE 86 09/04/2018 1223   GLUCOSE 102 (H) 07/23/2018 0944   BUN 13 09/04/2018 1223   CREATININE 1.02 (H) 09/04/2018 1223   CALCIUM 9.0 09/04/2018 1223   GFRNONAA 58 (L) 09/04/2018 1223   GFRAA 67 09/04/2018 1223   Lab Results  Component Value Date   HGBA1C 6.0 (H) 09/04/2018   Lab Results  Component Value Date   INSULIN 6.4 09/04/2018   CBC      Component Value Date/Time   WBC 5.2 07/23/2018 0944   RBC 4.81 07/23/2018 0944   HGB 13.1 07/23/2018 0944   HCT 41.4 07/23/2018 0944   PLT 228 07/23/2018 0944   MCV 86.1 07/23/2018 0944   MCH 27.2 07/23/2018 0944   MCHC 31.6 07/23/2018 0944   RDW 14.2 07/23/2018 0944   LYMPHSABS 2.0 05/09/2018 1543   MONOABS 0.4 05/09/2018 1543   EOSABS 0.2 05/09/2018 1543   BASOSABS 0.1 05/09/2018 1543   Iron/TIBC/Ferritin/ %Sat No results found for: IRON, TIBC, FERRITIN, IRONPCTSAT Lipid Panel     Component Value Date/Time   CHOL 182 09/04/2018 1223   TRIG 103 09/04/2018 1223   HDL 36 (L) 09/04/2018 1223   CHOLHDL 6 05/09/2018 1543   VLDL 47.6 (H) 05/09/2018 1543   LDLCALC 125 (H) 09/04/2018 1223   LDLDIRECT 157.0 05/09/2018 1543   Hepatic Function Panel     Component Value Date/Time   PROT 6.5 09/04/2018 1223   ALBUMIN 4.1 09/04/2018 1223   AST 14 09/04/2018 1223   ALT 10 09/04/2018 1223   ALKPHOS 63 09/04/2018 1223   BILITOT 0.6 09/04/2018 1223   BILIDIR 0.1 05/09/2018 1543      Component Value Date/Time   TSH 1.85 05/09/2018 1543    ECG  shows NSR with a rate of 62 BPM INDIRECT CALORIMETER done today shows a VO2 of 250 and a REE of 1735. Her calculated basal metabolic rate is 5573 thus her basal metabolic rate is better than expected.       OBESITY BEHAVIORAL INTERVENTION VISIT  Today's visit was # 1   Starting weight: 237 lbs Starting date: 09/04/2018 Today's weight : 237 lbs Today's date: 09/04/2018 Total lbs lost to date: 0    ASK: We discussed the diagnosis of obesity with Jennifer Quinn today and Jennifer Quinn agreed to give [place] permission to discuss obesity behavioral  modification therapy today.  ASSESS: Jennifer Quinn has the diagnosis of obesity and her BMI today is 34.8 Jennifer Quinn is in the action stage of change   ADVISE: Jennifer Quinn was educated on the multiple health risks of obesity as well as the benefit of weight loss to improve her health. She was advised of the  need for long term treatment and the importance of lifestyle modifications to improve her current health and to decrease her risk of future health problems.  AGREE: Multiple dietary modification options and treatment options were discussed and  Emaly agreed to follow the recommendations documented in the above note.  ARRANGE: Dannia was educated on the importance of frequent visits to treat obesity as outlined per CMS and USPSTF guidelines and agreed to schedule her next follow up appointment today.   I, Trixie Dredge, am acting as transcriptionist for Ilene Qua, MD    I have reviewed the above documentation for accuracy and completeness, and I agree with the above. - Ilene Qua, MD

## 2018-09-18 ENCOUNTER — Ambulatory Visit (INDEPENDENT_AMBULATORY_CARE_PROVIDER_SITE_OTHER): Payer: PRIVATE HEALTH INSURANCE | Admitting: Family Medicine

## 2018-09-18 VITALS — BP 144/80 | HR 78 | Temp 97.6°F | Ht 61.0 in | Wt 238.0 lb

## 2018-09-18 DIAGNOSIS — E559 Vitamin D deficiency, unspecified: Secondary | ICD-10-CM | POA: Diagnosis not present

## 2018-09-18 DIAGNOSIS — R7303 Prediabetes: Secondary | ICD-10-CM | POA: Diagnosis not present

## 2018-09-18 DIAGNOSIS — Z6841 Body Mass Index (BMI) 40.0 and over, adult: Secondary | ICD-10-CM

## 2018-09-18 DIAGNOSIS — F3289 Other specified depressive episodes: Secondary | ICD-10-CM

## 2018-09-18 DIAGNOSIS — E7849 Other hyperlipidemia: Secondary | ICD-10-CM | POA: Diagnosis not present

## 2018-09-18 DIAGNOSIS — Z9189 Other specified personal risk factors, not elsewhere classified: Secondary | ICD-10-CM

## 2018-09-19 ENCOUNTER — Telehealth (INDEPENDENT_AMBULATORY_CARE_PROVIDER_SITE_OTHER): Payer: Self-pay | Admitting: Internal Medicine

## 2018-09-19 MED ORDER — VITAMIN D (ERGOCALCIFEROL) 1.25 MG (50000 UNIT) PO CAPS: 50000.0000 [IU] | ORAL_CAPSULE | ORAL | 0 refills | Status: DC

## 2018-09-19 MED ORDER — BUPROPION HCL 100 MG PO TABS: 100.0000 mg | ORAL_TABLET | Freq: Every day | ORAL | 0 refills | Status: DC

## 2018-09-19 MED ORDER — METFORMIN HCL 500 MG PO TABS: 500.0000 mg | ORAL_TABLET | Freq: Every day | ORAL | 0 refills | Status: DC

## 2018-09-19 NOTE — Progress Notes (Signed)
Office: [phone]  /  Fax: [phone]   HPI:   Chief Complaint: OBESITY Jennifer Quinn is here to discuss her progress with her obesity treatment plan. She is on the Category 3 plan and is following her eating plan approximately 30 % of the time. She states she is exercising 0 minutes 0 times per week. Kayler didn't follow the plan, she overate most of the food on the plan. For breakfast she ate 3 eggs with toast. She would follow the plan for 3 days then spiral.  Her weight is 238 lb (108 kg) today and has gained 1 pound since her last visit. She has lost 0 lbs since starting treatment with [place].  Hyperlipidemia Sheria has hyperlipidemia and has been trying to improve her cholesterol levels with intensive lifestyle modification including a low saturated fat diet, exercise and weight loss. Her LDL is 125 and HDL is 36. She denies any chest pain, claudication or myalgias.  Pre-Diabetes Jermisha has a diagnosis of pre-diabetes based on her elevated Hgb A1c at 6.0 and insulin of 6.4. She was informed this puts her at greater risk of developing diabetes. She is not taking metformin currently and she notes carbohydrate cravings. She continues to work on diet and exercise to decrease risk of diabetes. She denies nausea or hypoglycemia.  At risk for diabetes Elyanah is at higher than average risk for developing diabetes due to her obesity and pre-diabetes. She currently denies polyuria or polydipsia.  Vitamin D Deficiency Bushra has a diagnosis of vitamin D deficiency. She is currently taking OTC Vit D 1,000 IU daily. She notes fatigue and denies nausea, vomiting or muscle weakness.  Depression with emotional eating behaviors Saige notes cravings with impulse control. She denies a history of seizures. She does not wish to see Dr. Mallie Mussel, our bariatric psychologist at this time. Quantavia struggles with emotional eating and using food for comfort to the extent that it is negatively impacting her health. She  often snacks when she is not hungry. Nevaeha sometimes feels she is out of control and then feels guilty that she made poor food choices. She has been working on behavior modification techniques to help reduce her emotional eating and has been somewhat successful. She shows no sign of suicidal or homicidal ideations.  Depression screen PHQ 2/9 09/04/2018  Decreased Interest 1  Down, Depressed, Hopeless 0  PHQ - 2 Score 1  Altered sleeping 1  Tired, decreased energy 1  Change in appetite 1  Feeling bad or failure about yourself  0  Trouble concentrating 1  Moving slowly or fidgety/restless 1  Suicidal thoughts 0  PHQ-9 Score 6  Difficult doing work/chores Not difficult at all    ASSESSMENT AND PLAN:  Other hyperlipidemia  Prediabetes - Plan: metFORMIN (GLUCOPHAGE) 500 MG tablet  Vitamin D deficiency - Plan: Vitamin D, Ergocalciferol, (DRISDOL) 1.25 MG (50000 UT) CAPS capsule  Other depression - with emotional eating  - Plan: buPROPion (WELLBUTRIN) 100 MG tablet  At risk for diabetes mellitus  Class 3 severe obesity with serious comorbidity and body mass index (BMI) of 45.0 to 49.9 in adult, unspecified obesity type (HCC)  PLAN:  Hyperlipidemia Daven was informed of the American Heart Association Guidelines emphasizing intensive lifestyle modifications as the first line treatment for hyperlipidemia. We discussed many lifestyle modifications today in depth, and Cameka will continue to work on decreasing saturated fats such as fatty red meat, butter and many fried foods. She will also increase vegetables and lean protein in her diet  and continue to work on exercise and weight loss efforts. We will repeat FLP in 3 months. Janaysha agrees to follow up with our clinic in 2 weeks.  Pre-Diabetes Hedwig will continue to work on weight loss, exercise, and decreasing simple carbohydrates in her diet to help decrease the risk of diabetes. We dicussed metformin including benefits and risks. She  was informed that eating too many simple carbohydrates or too many calories at one sitting increases the likelihood of GI side effects. Maryon agrees to start metformin 500 mg PO q AM #30 with no refills. Tahnee agrees to follow up with our clinic in 2 weeks as directed to monitor her progress.  Diabetes risk counselling Jake was given extended (30 minutes) diabetes prevention counseling today. She is 63 y.o. female and has risk factors for diabetes including obesity and pre-diabetes. We discussed intensive lifestyle modifications today with an emphasis on weight loss as well as increasing exercise and decreasing simple carbohydrates in her diet.  Vitamin D Deficiency Tajanay was informed that low vitamin D levels contributes to fatigue and are associated with obesity, breast, and colon cancer. Maddelynn agrees to start prescription Vit D @50 ,000 IU every week #4 with no refills, and stop OTC Vit D. She will follow up for routine testing of vitamin D, at least 2-3 times per year. She was informed of the risk of over-replacement of vitamin D and agrees to not increase her dose unless she discusses this with [place] first. Malin agrees to follow up with our clinic in 2 weeks.  Depression with Emotional Eating Behaviors We discussed behavior modification techniques today to help Leinani deal with her emotional eating and depression. Shyan agrees to start Wellbutrin SR 100 mg PO daily #30 with no refills. Dafne agrees to follow up with our clinic in 2 weeks.  Obesity Fredonia is currently in the action stage of change. As such, her goal is to continue with weight loss efforts She has agreed to follow the Category 3 plan Shauntia has been instructed to work up to a goal of 150 minutes of combined cardio and strengthening exercise per week for weight loss and overall health benefits. We discussed the following Behavioral Modification Strategies today: increasing lean protein intake, increasing vegetables, work on  meal planning and easy cooking plans, better snacking choices, and planning for success   Hildreth has agreed to follow up with our clinic in 2 weeks. She was informed of the importance of frequent follow up visits to maximize her success with intensive lifestyle modifications for her multiple health conditions.  ALLERGIES: No Known Allergies  MEDICATIONS: Current Outpatient Medications on File Prior to Visit  Medication Sig Dispense Refill  . Cholecalciferol (VITAMIN D PO) Take 1,000 Units by mouth daily.     . Fish Oil OIL Take 2,000 mg by mouth daily.      . hydrochlorothiazide (MICROZIDE) 12.5 MG capsule Take 1 capsule (12.5 mg total) by mouth 2 (two) times daily. (Patient taking differently: Take 12.5 mg by mouth 2 (two) times daily. ) 180 capsule 2  . KLOR-CON M20 20 MEQ tablet TAKE 1 TABLET BY MOUTH EVERY DAY 30 tablet 1   No current facility-administered medications on file prior to visit.     PAST MEDICAL HISTORY: Past Medical History:  Diagnosis Date  . Asthma   . Chest pain   . Chronic constipation   . Depression   . Fibroid   . GERD (gastroesophageal reflux disease)    occ  . History of  anemia   . Hypertension   . PMB (postmenopausal bleeding)   . Vitamin D deficiency   . Wears contact lenses   . Wears partial dentures    upper and lower    PAST SURGICAL HISTORY: Past Surgical History:  Procedure Laterality Date  . COLONOSCOPY  2007, 01/25/2011. 05/25/16  . DILATATION & CURETTAGE/HYSTEROSCOPY WITH MYOSURE N/A 07/23/2018   Procedure: DILATATION & CURETTAGE/HYSTEROSCOPY WITH MYOSURE;  Surgeon: Salvadore Dom, MD;  Location: [facility];  Service: Gynecology;  Laterality: N/A;  Follow 1st case. Use [facility] virtual [facility] in place of [facility] block time.    SOCIAL HISTORY: Social History   Tobacco Use  . Smoking status: Former Smoker    Years: 10.00    Types: Cigarettes    Last attempt to quit: 07/18/1985    Years since quitting: 33.1  . Smokeless  tobacco: Never Used  Substance Use Topics  . Alcohol use: Yes    Alcohol/week: 1.0 standard drinks    Types: 1 Standard drinks or equivalent per week    Comment: seldom  . Drug use: Never    FAMILY HISTORY: Family History  Problem Relation Age of Onset  . Colon cancer Mother   . Diabetes Mother   . Obesity Mother   . Breast cancer Sister   . Stroke Father   . Alcoholism Father   . Thyroid disease Daughter   . Multiple sclerosis Daughter     ROS: Review of Systems  Constitutional: Positive for malaise/fatigue. Negative for weight loss.  Cardiovascular: Negative for chest pain and claudication.  Gastrointestinal: Negative for nausea and vomiting.  Genitourinary: Negative for frequency.  Musculoskeletal: Negative for myalgias.       Negative muscle weakness  Endo/Heme/Allergies: Negative for polydipsia.       Negative hypoglycemia  Psychiatric/Behavioral: Positive for depression. Negative for suicidal ideas.    PHYSICAL EXAM: Blood pressure (!) 144/80, pulse 78, temperature 97.6 F (36.4 C), temperature source Oral, height 5\' 1"  (1.549 m), weight 238 lb (108 kg), SpO2 97 %. Body mass index is 44.97 kg/m. Physical Exam Vitals signs reviewed.  Constitutional:      Appearance: Normal appearance. She is obese.  Cardiovascular:     Rate and Rhythm: Normal rate.     Pulses: Normal pulses.  Pulmonary:     Effort: Pulmonary effort is normal.     Breath sounds: Normal breath sounds.  Musculoskeletal: Normal range of motion.  Skin:    General: Skin is warm and dry.  Neurological:     Mental Status: She is alert and oriented to person, place, and time.  Psychiatric:        Mood and Affect: Mood normal.        Behavior: Behavior normal.     RECENT LABS AND TESTS: BMET    Component Value Date/Time   NA 141 09/04/2018 1223   K 3.8 09/04/2018 1223   CL 99 09/04/2018 1223   CO2 25 09/04/2018 1223   GLUCOSE 86 09/04/2018 1223   GLUCOSE 102 (H) 07/23/2018 0944   BUN  13 09/04/2018 1223   CREATININE 1.02 (H) 09/04/2018 1223   CALCIUM 9.0 09/04/2018 1223   GFRNONAA 58 (L) 09/04/2018 1223   GFRAA 67 09/04/2018 1223   Lab Results  Component Value Date   HGBA1C 6.0 (H) 09/04/2018   HGBA1C 6.1 09/14/2016   HGBA1C 6.1 05/22/2015   HGBA1C 6.4 01/22/2014   Lab Results  Component Value Date   INSULIN 6.4 09/04/2018  CBC    Component Value Date/Time   WBC 5.2 07/23/2018 0944   RBC 4.81 07/23/2018 0944   HGB 13.1 07/23/2018 0944   HCT 41.4 07/23/2018 0944   PLT 228 07/23/2018 0944   MCV 86.1 07/23/2018 0944   MCH 27.2 07/23/2018 0944   MCHC 31.6 07/23/2018 0944   RDW 14.2 07/23/2018 0944   LYMPHSABS 2.0 05/09/2018 1543   MONOABS 0.4 05/09/2018 1543   EOSABS 0.2 05/09/2018 1543   BASOSABS 0.1 05/09/2018 1543   Iron/TIBC/Ferritin/ %Sat No results found for: IRON, TIBC, FERRITIN, IRONPCTSAT Lipid Panel     Component Value Date/Time   CHOL 182 09/04/2018 1223   TRIG 103 09/04/2018 1223   HDL 36 (L) 09/04/2018 1223   CHOLHDL 6 05/09/2018 1543   VLDL 47.6 (H) 05/09/2018 1543   LDLCALC 125 (H) 09/04/2018 1223   LDLDIRECT 157.0 05/09/2018 1543   Hepatic Function Panel     Component Value Date/Time   PROT 6.5 09/04/2018 1223   ALBUMIN 4.1 09/04/2018 1223   AST 14 09/04/2018 1223   ALT 10 09/04/2018 1223   ALKPHOS 63 09/04/2018 1223   BILITOT 0.6 09/04/2018 1223   BILIDIR 0.1 05/09/2018 1543      Component Value Date/Time   TSH 1.85 05/09/2018 1543   TSH 3.44 09/14/2016 1255   TSH 1.53 05/22/2015 1027      OBESITY BEHAVIORAL INTERVENTION VISIT  Today's visit was # 2   Starting weight: 237 lbs Starting date: 09/04/2018 Today's weight : 238 lbs  Today's date: 09/18/2018 Total lbs lost to date: 0    ASK: We discussed the diagnosis of obesity with Lyman Speller today and Lacheryl agreed to give [place] permission to discuss obesity behavioral modification therapy today.  ASSESS: Montserrath has the diagnosis of obesity and her BMI  today is 44.99 Tanayah is in the action stage of change   ADVISE: Chiyeko was educated on the multiple health risks of obesity as well as the benefit of weight loss to improve her health. She was advised of the need for long term treatment and the importance of lifestyle modifications to improve her current health and to decrease her risk of future health problems.  AGREE: Multiple dietary modification options and treatment options were discussed and  Keaunna agreed to follow the recommendations documented in the above note.  ARRANGE: Jaxon was educated on the importance of frequent visits to treat obesity as outlined per CMS and USPSTF guidelines and agreed to schedule her next follow up appointment today.  I, Trixie Dredge, am acting as transcriptionist for Ilene Qua, MD  I have reviewed the above documentation for accuracy and completeness, and I agree with the above. - Ilene Qua, MD

## 2018-09-19 NOTE — Telephone Encounter (Signed)
Patient called stated she was seen yesterday and told a RX for metformin & vitamin D would be called in. Patient checked and pharmacy has not received. Please check.  [facility].  Thank you.

## 2018-09-20 ENCOUNTER — Telehealth (INDEPENDENT_AMBULATORY_CARE_PROVIDER_SITE_OTHER): Payer: Self-pay | Admitting: Family Medicine

## 2018-09-20 NOTE — Telephone Encounter (Signed)
Pt was seen earlier this week and was to have three RX's sent to Pharmacy. Pharmacy only received Wellbutrin RX but did not receive Vitamin D or Metformin. drh

## 2018-10-03 ENCOUNTER — Ambulatory Visit (INDEPENDENT_AMBULATORY_CARE_PROVIDER_SITE_OTHER): Payer: PRIVATE HEALTH INSURANCE | Admitting: Family Medicine

## 2018-10-03 ENCOUNTER — Encounter (INDEPENDENT_AMBULATORY_CARE_PROVIDER_SITE_OTHER): Payer: Self-pay

## 2018-10-17 ENCOUNTER — Encounter (INDEPENDENT_AMBULATORY_CARE_PROVIDER_SITE_OTHER): Payer: Self-pay

## 2018-10-17 ENCOUNTER — Ambulatory Visit (INDEPENDENT_AMBULATORY_CARE_PROVIDER_SITE_OTHER): Payer: PRIVATE HEALTH INSURANCE | Admitting: Family Medicine

## 2018-11-19 ENCOUNTER — Encounter (INDEPENDENT_AMBULATORY_CARE_PROVIDER_SITE_OTHER): Payer: Self-pay

## 2018-11-24 ENCOUNTER — Other Ambulatory Visit (INDEPENDENT_AMBULATORY_CARE_PROVIDER_SITE_OTHER): Payer: Self-pay | Admitting: Family Medicine

## 2018-11-24 DIAGNOSIS — R7303 Prediabetes: Secondary | ICD-10-CM

## 2018-11-24 DIAGNOSIS — F3289 Other specified depressive episodes: Secondary | ICD-10-CM

## 2018-11-27 ENCOUNTER — Ambulatory Visit (INDEPENDENT_AMBULATORY_CARE_PROVIDER_SITE_OTHER): Payer: PRIVATE HEALTH INSURANCE | Admitting: Family Medicine

## 2018-11-27 ENCOUNTER — Other Ambulatory Visit: Payer: Self-pay

## 2018-11-27 DIAGNOSIS — R7303 Prediabetes: Secondary | ICD-10-CM | POA: Diagnosis not present

## 2018-11-27 DIAGNOSIS — F3289 Other specified depressive episodes: Secondary | ICD-10-CM | POA: Diagnosis not present

## 2018-11-27 DIAGNOSIS — Z6841 Body Mass Index (BMI) 40.0 and over, adult: Secondary | ICD-10-CM

## 2018-11-27 MED ORDER — BUPROPION HCL 100 MG PO TABS: 100.0000 mg | ORAL_TABLET | Freq: Every day | ORAL | 0 refills | Status: DC

## 2018-11-27 MED ORDER — METFORMIN HCL 500 MG PO TABS: 500.0000 mg | ORAL_TABLET | Freq: Every day | ORAL | 0 refills | Status: DC

## 2018-11-27 NOTE — Progress Notes (Signed)
Office: [phone]  /  Fax: [phone] TeleHealth Visit:  Jennifer Quinn has verbally consented to this TeleHealth visit today. The patient is located at home, the provider is located at the [employer] [facility] office. The participants in this visit include the listed provider and patient and provider's assistant. The visit was conducted today via face time.  HPI:   Chief Complaint: OBESITY Jennifer Quinn is here to discuss her progress with her obesity treatment plan. She is on the Category 3 plan and is following her eating plan approximately 70 % of the time. She states she is on the treadmill for 30 minutes 3 times per week. Tylah is at work currently. She hasn't been seen since January, but voiced she was feeling much better with new medications started at her first follow up. She has cut back significantly on snacking and has increased her mindfulness of eating, limiting her portions of indulgences.  We were unable to weigh the patient today for this TeleHealth visit. She feels as if she has lost 4 lbs since her last visit. She has lost 0 lbs since starting treatment with [place].  Pre-Diabetes Jennifer Quinn has a diagnosis of pre-diabetes based on her elevated Hgb A1c and was informed this puts her at greater risk of developing diabetes. She is taking metformin currently and notes minimal carbohydrate cravings, unlike previously where she previously ate about 4-5 snacks daily and almost 4-5 meals per day. She continues to work on diet and exercise to decrease risk of diabetes. She denies nausea or hypoglycemia.  Depression with Emotional Eating Behaviors Jennifer Quinn notes symptoms of cravings have greatly improved with Wellbutrin. Jennifer Quinn struggles with emotional eating and using food for comfort to the extent that it is negatively impacting her health. She often snacks when she is not hungry. Jennifer Quinn sometimes feels she is out of control and then feels guilty that she made poor food choices. She has been  working on behavior modification techniques to help reduce her emotional eating and has been somewhat successful. She shows no sign of suicidal or homicidal ideations.  Depression screen PHQ 2/9 09/04/2018  Decreased Interest 1  Down, Depressed, Hopeless 0  PHQ - 2 Score 1  Altered sleeping 1  Tired, decreased energy 1  Change in appetite 1  Feeling bad or failure about yourself  0  Trouble concentrating 1  Moving slowly or fidgety/restless 1  Suicidal thoughts 0  PHQ-9 Score 6  Difficult doing work/chores Not difficult at all    ASSESSMENT AND PLAN:  Prediabetes - Plan: metFORMIN (GLUCOPHAGE) 500 MG tablet  Other depression - with emotional eating  - Plan: buPROPion (WELLBUTRIN) 100 MG tablet  Class 3 severe obesity with serious comorbidity and body mass index (BMI) of 45.0 to 49.9 in adult, unspecified obesity type ([place])  PLAN:  Pre-Diabetes Jennifer Quinn will continue to work on weight loss, exercise, and decreasing simple carbohydrates in her diet to help decrease the risk of diabetes. We dicussed metformin including benefits and risks. She was informed that eating too many simple carbohydrates or too many calories at one sitting increases the likelihood of GI side effects. Jennifer Quinn agrees to continue taking metformin 500 mg PO daily #30 and we will refill for 1 month. Jennifer Quinn agrees to follow up with our clinic in 2 weeks as directed to monitor her progress.  Depression with Emotional Eating Behaviors We discussed behavior modification techniques today to help Jennifer Quinn deal with her emotional eating and depression. Jennifer Quinn agrees to continue bupropion 100 mg  PO daily #30 and we will refill for 1 month. Jennifer Quinn agrees to follow up with our clinic in 2 weeks.  Obesity Jennifer Quinn is currently in the action stage of change. As such, her goal is to continue with weight loss efforts She has agreed to follow the Category 3 plan Jennifer Quinn has been instructed to work up to a goal of 150 minutes of  combined cardio and strengthening exercise per week for weight loss and overall health benefits. We discussed the following Behavioral Modification Strategies today: increasing lean protein intake, increasing vegetables and work on meal planning and easy cooking plans, better snacking choices, and planning for success   Jennifer Quinn has agreed to follow up with our clinic in 2 weeks. She was informed of the importance of frequent follow up visits to maximize her success with intensive lifestyle modifications for her multiple health conditions.  ALLERGIES: No Known Allergies  MEDICATIONS: Current Outpatient Medications on File Prior to Visit  Medication Sig Dispense Refill  . Cholecalciferol (VITAMIN D PO) Take 1,000 Units by mouth daily.     . Fish Oil OIL Take 2,000 mg by mouth daily.      . hydrochlorothiazide (MICROZIDE) 12.5 MG capsule Take 1 capsule (12.5 mg total) by mouth 2 (two) times daily. (Patient taking differently: Take 12.5 mg by mouth 2 (two) times daily. ) 180 capsule 2  . KLOR-CON M20 20 MEQ tablet TAKE 1 TABLET BY MOUTH EVERY DAY 30 tablet 1  . Vitamin D, Ergocalciferol, (DRISDOL) 1.25 MG (50000 UT) CAPS capsule Take 1 capsule (50,000 Units total) by mouth every 7 (seven) days. 4 capsule 0   No current facility-administered medications on file prior to visit.     PAST MEDICAL HISTORY: Past Medical History:  Diagnosis Date  . Asthma   . Chest pain   . Chronic constipation   . Depression   . Fibroid   . GERD (gastroesophageal reflux disease)    occ  . History of anemia   . Hypertension   . PMB (postmenopausal bleeding)   . Vitamin D deficiency   . Wears contact lenses   . Wears partial dentures    upper and lower    PAST SURGICAL HISTORY: Past Surgical History:  Procedure Laterality Date  . COLONOSCOPY  2007, 01/25/2011. 05/25/16  . DILATATION & CURETTAGE/HYSTEROSCOPY WITH MYOSURE N/A 07/23/2018   Procedure: DILATATION & CURETTAGE/HYSTEROSCOPY WITH MYOSURE;   Surgeon: Salvadore Dom, MD;  Location: [facility];  Service: Gynecology;  Laterality: N/A;  Follow 1st case. [facility] of [facility] block time.    SOCIAL HISTORY: Social History   Tobacco Use  . Smoking status: Former Smoker    Years: 10.00    Types: Cigarettes    Last attempt to quit: 07/18/1985    Years since quitting: 33.3  . Smokeless tobacco: Never Used  Substance Use Topics  . Alcohol use: Yes    Alcohol/week: 1.0 standard drinks    Types: 1 Standard drinks or equivalent per week    Comment: seldom  . Drug use: Never    FAMILY HISTORY: Family History  Problem Relation Age of Onset  . Colon cancer Mother   . Diabetes Mother   . Obesity Mother   . Breast cancer Sister   . Stroke Father   . Alcoholism Father   . Thyroid disease Daughter   . Multiple sclerosis Daughter     ROS: Review of Systems  Constitutional: Positive for weight loss.  Gastrointestinal:  Negative for nausea.  Endo/Heme/Allergies:       Negative hypoglycemia  Psychiatric/Behavioral: Positive for depression. Negative for suicidal ideas.    PHYSICAL EXAM: Pt in no acute distress  RECENT LABS AND TESTS: BMET    Component Value Date/Time   NA 141 09/04/2018 1223   K 3.8 09/04/2018 1223   CL 99 09/04/2018 1223   CO2 25 09/04/2018 1223   GLUCOSE 86 09/04/2018 1223   GLUCOSE 102 (H) 07/23/2018 0944   BUN 13 09/04/2018 1223   CREATININE 1.02 (H) 09/04/2018 1223   CALCIUM 9.0 09/04/2018 1223   GFRNONAA 58 (L) 09/04/2018 1223   GFRAA 67 09/04/2018 1223   Lab Results  Component Value Date   HGBA1C 6.0 (H) 09/04/2018   HGBA1C 6.1 09/14/2016   HGBA1C 6.1 05/22/2015   HGBA1C 6.4 01/22/2014   Lab Results  Component Value Date   INSULIN 6.4 09/04/2018   CBC    Component Value Date/Time   WBC 5.2 07/23/2018 0944   RBC 4.81 07/23/2018 0944   HGB 13.1 07/23/2018 0944   HCT 41.4 07/23/2018 0944   PLT 228 07/23/2018 0944   MCV 86.1 07/23/2018 0944    MCH 27.2 07/23/2018 0944   MCHC 31.6 07/23/2018 0944   RDW 14.2 07/23/2018 0944   LYMPHSABS 2.0 05/09/2018 1543   MONOABS 0.4 05/09/2018 1543   EOSABS 0.2 05/09/2018 1543   BASOSABS 0.1 05/09/2018 1543   Iron/TIBC/Ferritin/ %Sat No results found for: IRON, TIBC, FERRITIN, IRONPCTSAT Lipid Panel     Component Value Date/Time   CHOL 182 09/04/2018 1223   TRIG 103 09/04/2018 1223   HDL 36 (L) 09/04/2018 1223   CHOLHDL 6 05/09/2018 1543   VLDL 47.6 (H) 05/09/2018 1543   LDLCALC 125 (H) 09/04/2018 1223   LDLDIRECT 157.0 05/09/2018 1543   Hepatic Function Panel     Component Value Date/Time   PROT 6.5 09/04/2018 1223   ALBUMIN 4.1 09/04/2018 1223   AST 14 09/04/2018 1223   ALT 10 09/04/2018 1223   ALKPHOS 63 09/04/2018 1223   BILITOT 0.6 09/04/2018 1223   BILIDIR 0.1 05/09/2018 1543      Component Value Date/Time   TSH 1.85 05/09/2018 1543   TSH 3.44 09/14/2016 1255   TSH 1.53 05/22/2015 1027      I, Trixie Dredge, am acting as Location manager for Ilene Qua, MD  I have reviewed the above documentation for accuracy and completeness, and I agree with the above. - Ilene Qua, MD

## 2018-12-11 ENCOUNTER — Other Ambulatory Visit: Payer: Self-pay

## 2018-12-11 ENCOUNTER — Ambulatory Visit (INDEPENDENT_AMBULATORY_CARE_PROVIDER_SITE_OTHER): Payer: PRIVATE HEALTH INSURANCE | Admitting: Family Medicine

## 2018-12-11 DIAGNOSIS — E7849 Other hyperlipidemia: Secondary | ICD-10-CM | POA: Diagnosis not present

## 2018-12-11 DIAGNOSIS — I10 Essential (primary) hypertension: Secondary | ICD-10-CM | POA: Diagnosis not present

## 2018-12-11 DIAGNOSIS — R7303 Prediabetes: Secondary | ICD-10-CM

## 2018-12-11 DIAGNOSIS — Z6841 Body Mass Index (BMI) 40.0 and over, adult: Secondary | ICD-10-CM

## 2018-12-12 NOTE — Progress Notes (Signed)
Office: [phone]  /  Fax: [phone] TeleHealth Visit:  Jennifer Quinn has verbally consented to this TeleHealth visit today. The patient is located at home, the provider is located at the [facility] office. The participants in this visit include the listed provider and patient. The visit was conducted today via face time.  HPI:   Chief Complaint: OBESITY Jennifer Quinn is here to discuss her progress with her obesity treatment plan. She is on the Category 3 plan and is following her eating plan approximately 60 % of the time. She states she is on the treadmill for 30 minutes 3 times per week. [name] put on 3 lbs and she hasn't been drinking enough, and she is excessively snacking at home. She has been doing lunch meat and ice cream for snacks. She is snacking mostly at night. She is cutting back on protein at dinner and that could be leading to her increase in her snacking.  We were unable to weigh the patient today for this TeleHealth visit. She feels as if she has gained 3 lbs since her last visit. She has lost 0 lbs since starting treatment with [place].  Hyperlipidemia [name] has hyperlipidemia and has been trying to improve her cholesterol levels with intensive lifestyle modification including a low saturated fat diet, exercise and weight loss. Last LDL was of 125 and HDL of 36. She denies any chest pain, claudication or myalgias.  Pre-Diabetes Jennifer Quinn has a diagnosis of pre-diabetes based on her elevated Hgb A1c and was informed this puts her at greater risk of developing diabetes. She notes carbohydrate cravings and excessive snacking. She denies GI side effects of metformin and continues to work on diet and exercise to decrease risk of diabetes. She denies hypoglycemia.  Hypertension Jennifer Quinn is a 63 y.o. female with hypertension. [name]'s blood pressure is better controlled at home (130/70). She is on hydrochlorothiazide. Her blood pressure has not increased with  addition of bupropion. She denies chest pain. She is working on weight loss to help control her blood pressure with the goal of decreasing her risk of heart attack and stroke.  ASSESSMENT AND PLAN:  Other hyperlipidemia  Prediabetes  Essential hypertension  Class 3 severe obesity with serious comorbidity and body mass index (BMI) of 40.0 to 44.9 in adult, unspecified obesity type ([place])  PLAN:  Hyperlipidemia [name] was informed of the American Heart Association Guidelines emphasizing intensive lifestyle modifications as the first line treatment for hyperlipidemia. We discussed many lifestyle modifications today in depth, and Theone will continue to work on decreasing saturated fats such as fatty red meat, butter and many fried foods. She will also increase vegetables and lean protein in her diet and continue to work on exercise and weight loss efforts. We will repeat labs at next appointment. [name] agrees to follow up with our clinic in 2 weeks.  Pre-Diabetes [name] will continue to work on weight loss, exercise, and decreasing simple carbohydrates in her diet to help decrease the risk of diabetes. We dicussed metformin including benefits and risks. She was informed that eating too many simple carbohydrates or too many calories at one sitting increases the likelihood of GI side effects. [name] agrees to continue taking metformin and we will repeat labs at next appointment. [name] agrees to follow up with our clinic in 2 weeks as directed to monitor her progress.  Hypertension We discussed sodium restriction, working on healthy weight loss, and a regular exercise program as the means to achieve improved blood  pressure control. Jennifer Quinn agreed with this plan and agreed to follow up as directed. We will continue to monitor her blood pressure as well as her progress with the above lifestyle modifications. [name] agrees to continue her current medications and will watch for signs of hypotension as she  continues her lifestyle modifications. We will follow up at next appointment to ensure her blood pressure is within normal limits. [name] agrees to follow up with our clinic in 2 weeks.  I spent > than 50% of the 15 minute visit on counseling as documented in the note.  Obesity Jennifer Quinn is currently in the action stage of change. As such, her goal is to continue with weight loss efforts She has agreed to follow the Category 2 plan [name] has been instructed to work up to a goal of 150 minutes of combined cardio and strengthening exercise per week for weight loss and overall health benefits. We discussed the following Behavioral Modification Strategies today: increasing lean protein intake, work on meal planning and easy cooking plans, emotional eating strategies, ways to avoid boredom eating, ways to avoid night time snacking, better snacking choices, and planning for success   Jennifer Quinn has agreed to follow up with our clinic in 2 weeks. She was informed of the importance of frequent follow up visits to maximize her success with intensive lifestyle modifications for her multiple health conditions.  ALLERGIES: No Known Allergies  MEDICATIONS: Current Outpatient Medications on File Prior to Visit  Medication Sig Dispense Refill   buPROPion (WELLBUTRIN) 100 MG tablet Take 1 tablet (100 mg total) by mouth daily. 30 tablet 0   Cholecalciferol (VITAMIN D PO) Take 1,000 Units by mouth daily.      Fish Oil OIL Take 2,000 mg by mouth daily.       hydrochlorothiazide (MICROZIDE) 12.5 MG capsule Take 1 capsule (12.5 mg total) by mouth 2 (two) times daily. (Patient taking differently: Take 12.5 mg by mouth 2 (two) times daily. ) 180 capsule 2   KLOR-CON M20 20 MEQ tablet TAKE 1 TABLET BY MOUTH EVERY DAY 30 tablet 1   metFORMIN (GLUCOPHAGE) 500 MG tablet Take 1 tablet (500 mg total) by mouth daily with breakfast. 30 tablet 0   Vitamin D, Ergocalciferol, (DRISDOL) 1.25 MG (50000 UT) CAPS capsule Take  1 capsule (50,000 Units total) by mouth every 7 (seven) days. 4 capsule 0   No current facility-administered medications on file prior to visit.     PAST MEDICAL HISTORY: Past Medical History:  Diagnosis Date   Asthma    Chest pain    Chronic constipation    Depression    Fibroid    GERD (gastroesophageal reflux disease)    occ   History of anemia    Hypertension    PMB (postmenopausal bleeding)    Vitamin D deficiency    Wears contact lenses    Wears partial dentures    upper and lower    PAST SURGICAL HISTORY: Past Surgical History:  Procedure Laterality Date   COLONOSCOPY  2007, 01/25/2011. 05/25/16   DILATATION & CURETTAGE/HYSTEROSCOPY WITH MYOSURE N/A 07/23/2018   Procedure: DILATATION & CURETTAGE/HYSTEROSCOPY WITH MYOSURE;  Surgeon: Salvadore Dom, MD;  Location: [facility];  Service: Gynecology;  Laterality: N/A;  Follow 1st case. Use [facility] virtual OR in place of [facility] block time.    SOCIAL HISTORY: Social History   Tobacco Use   Smoking status: Former Smoker    Years: 10.00    Types: Cigarettes  Last attempt to quit: 07/18/1985    Years since quitting: 33.4   Smokeless tobacco: Never Used  Substance Use Topics   Alcohol use: Yes    Alcohol/week: 1.0 standard drinks    Types: 1 Standard drinks or equivalent per week    Comment: seldom   Drug use: Never    FAMILY HISTORY: Family History  Problem Relation Age of Onset   Colon cancer Mother    Diabetes Mother    Obesity Mother    Breast cancer Sister    Stroke Father    Alcoholism Father    Thyroid disease Daughter    Multiple sclerosis Daughter     ROS: Review of Systems  Constitutional: Negative for weight loss.  Cardiovascular: Negative for chest pain and claudication.  Musculoskeletal: Negative for myalgias.  Endo/Heme/Allergies:       Negative hypoglycemia    PHYSICAL EXAM: Pt in no acute distress  RECENT LABS AND TESTS: BMET    Component  Value Date/Time   NA 141 09/04/2018 1223   K 3.8 09/04/2018 1223   CL 99 09/04/2018 1223   CO2 25 09/04/2018 1223   GLUCOSE 86 09/04/2018 1223   GLUCOSE 102 (H) 07/23/2018 0944   BUN 13 09/04/2018 1223   CREATININE 1.02 (H) 09/04/2018 1223   CALCIUM 9.0 09/04/2018 1223   GFRNONAA 58 (L) 09/04/2018 1223   GFRAA 67 09/04/2018 1223   Lab Results  Component Value Date   HGBA1C 6.0 (H) 09/04/2018   HGBA1C 6.1 09/14/2016   HGBA1C 6.1 05/22/2015   HGBA1C 6.4 01/22/2014   Lab Results  Component Value Date   INSULIN 6.4 09/04/2018   CBC    Component Value Date/Time   WBC 5.2 07/23/2018 0944   RBC 4.81 07/23/2018 0944   HGB 13.1 07/23/2018 0944   HCT 41.4 07/23/2018 0944   PLT 228 07/23/2018 0944   MCV 86.1 07/23/2018 0944   MCH 27.2 07/23/2018 0944   MCHC 31.6 07/23/2018 0944   RDW 14.2 07/23/2018 0944   LYMPHSABS 2.0 05/09/2018 1543   MONOABS 0.4 05/09/2018 1543   EOSABS 0.2 05/09/2018 1543   BASOSABS 0.1 05/09/2018 1543   Iron/TIBC/Ferritin/ %Sat No results found for: IRON, TIBC, FERRITIN, IRONPCTSAT Lipid Panel     Component Value Date/Time   CHOL 182 09/04/2018 1223   TRIG 103 09/04/2018 1223   HDL 36 (L) 09/04/2018 1223   CHOLHDL 6 05/09/2018 1543   VLDL 47.6 (H) 05/09/2018 1543   LDLCALC 125 (H) 09/04/2018 1223   LDLDIRECT 157.0 05/09/2018 1543   Hepatic Function Panel     Component Value Date/Time   PROT 6.5 09/04/2018 1223   ALBUMIN 4.1 09/04/2018 1223   AST 14 09/04/2018 1223   ALT 10 09/04/2018 1223   ALKPHOS 63 09/04/2018 1223   BILITOT 0.6 09/04/2018 1223   BILIDIR 0.1 05/09/2018 1543      Component Value Date/Time   TSH 1.85 05/09/2018 1543   TSH 3.44 09/14/2016 1255   TSH 1.53 05/22/2015 1027      I, Trixie Dredge, am acting as Location manager for Ilene Qua, MD  I have reviewed the above documentation for accuracy and completeness, and I agree with the above. - Ilene Qua, MD

## 2018-12-19 ENCOUNTER — Other Ambulatory Visit (INDEPENDENT_AMBULATORY_CARE_PROVIDER_SITE_OTHER): Payer: Self-pay | Admitting: Family Medicine

## 2018-12-19 DIAGNOSIS — F3289 Other specified depressive episodes: Secondary | ICD-10-CM

## 2018-12-25 ENCOUNTER — Ambulatory Visit (INDEPENDENT_AMBULATORY_CARE_PROVIDER_SITE_OTHER): Payer: PRIVATE HEALTH INSURANCE | Admitting: Family Medicine

## 2019-01-01 ENCOUNTER — Ambulatory Visit (INDEPENDENT_AMBULATORY_CARE_PROVIDER_SITE_OTHER): Payer: PRIVATE HEALTH INSURANCE | Admitting: Family Medicine

## 2019-01-01 ENCOUNTER — Other Ambulatory Visit: Payer: Self-pay

## 2019-01-01 ENCOUNTER — Encounter (INDEPENDENT_AMBULATORY_CARE_PROVIDER_SITE_OTHER): Payer: Self-pay | Admitting: Family Medicine

## 2019-01-01 DIAGNOSIS — E66813 Obesity, class 3: Secondary | ICD-10-CM

## 2019-01-01 DIAGNOSIS — Z6841 Body Mass Index (BMI) 40.0 and over, adult: Secondary | ICD-10-CM

## 2019-01-01 DIAGNOSIS — F3289 Other specified depressive episodes: Secondary | ICD-10-CM

## 2019-01-01 DIAGNOSIS — R7303 Prediabetes: Secondary | ICD-10-CM | POA: Diagnosis not present

## 2019-01-01 MED ORDER — BUPROPION HCL 100 MG PO TABS: 100.0000 mg | ORAL_TABLET | Freq: Every day | ORAL | 0 refills | Status: DC

## 2019-01-01 MED ORDER — METFORMIN HCL 500 MG PO TABS: 500.0000 mg | ORAL_TABLET | Freq: Every day | ORAL | 0 refills | Status: DC

## 2019-01-01 NOTE — Progress Notes (Signed)
Office: [phone]  /  Fax: [phone] TeleHealth Visit:  Jennifer Quinn has verbally consented to this TeleHealth visit today. The patient is located at home, the provider is located at the [facility] office. The participants in this visit include the listed provider and patient. The visit was conducted today via face time.  HPI:   Chief Complaint: OBESITY Jennifer Quinn is here to discuss her progress with her obesity treatment plan. She is on the Category 3 plan and is following her eating plan approximately 75 % of the time. She states she is walking for 30 minutes 2 times per week. Jennifer Quinn has done occasional indulgent eating, but is able to control herself. She just went and picked up some food, baked chicken and fries. She denies hunger. She states her blood pressure ranges between 120/70 and 130/70. We were unable to weigh the patient today for this TeleHealth visit. She feels as if she has maintained her weight since her last visit. She has lost 0 lbs since starting treatment with [place].  Pre-Diabetes Jennifer Quinn has a diagnosis of pre-diabetes based on her elevated Hgb A1c of 6.0 and insulin of 6.4. She states her BGs range at 107. She was informed this puts her at greater risk of developing diabetes. She is taking metformin currently and continues to work on diet and exercise to decrease risk of diabetes. She denies nausea or hypoglycemia.  Depression with Emotional Eating Behaviors Jennifer Quinn notes her symptoms are better controlled with Wellbutrin. Jennifer Quinn struggles with emotional eating and using food for comfort to the extent that it is negatively impacting her health. She often snacks when she is not hungry. Jennifer Quinn sometimes feels she is out of control and then feels guilty that she made poor food choices. She has been working on behavior modification techniques to help reduce her emotional eating and has been somewhat successful. She shows no sign of suicidal or homicidal  ideations.  Depression screen PHQ 2/9 09/04/2018  Decreased Interest 1  Down, Depressed, Hopeless 0  PHQ - 2 Score 1  Altered sleeping 1  Tired, decreased energy 1  Change in appetite 1  Feeling bad or failure about yourself  0  Trouble concentrating 1  Moving slowly or fidgety/restless 1  Suicidal thoughts 0  PHQ-9 Score 6  Difficult doing work/chores Not difficult at all    ASSESSMENT AND PLAN:  Prediabetes - Plan: metFORMIN (GLUCOPHAGE) 500 MG tablet  Other depression - with emotional eating  - Plan: buPROPion (WELLBUTRIN) 100 MG tablet  Class 3 severe obesity with serious comorbidity and body mass index (BMI) of 40.0 to 44.9 in adult, unspecified obesity type ([place])  PLAN:  Pre-Diabetes Jennifer Quinn will continue to work on weight loss, exercise, and decreasing simple carbohydrates in her diet to help decrease the risk of diabetes. We dicussed metformin including benefits and risks. She was informed that eating too many simple carbohydrates or too many calories at one sitting increases the likelihood of GI side effects. Jennifer Quinn agrees to continue taking metformin 500 mg PO daily #30 and we will refill for 1 month. Jennifer Quinn agrees to follow up with our clinic in 2 weeks as directed to monitor her progress.  Depression with Emotional Eating Behaviors We discussed behavior modification techniques today to help Jennifer Quinn deal with her emotional eating and depression. Jennifer Quinn agrees to continue taking Wellbutrin 100 mg PO daily #30 and we will refill for 1 month. Jennifer Quinn agrees to follow up with our clinic in 2 weeks.  Obesity  Jennifer Quinn is currently in the action stage of change. As such, her goal is to continue with weight loss efforts She has agreed to follow the Category 3 plan Jennifer Quinn has been instructed to work up to a goal of 150 minutes of combined cardio and strengthening exercise per week for weight loss and overall health benefits. We discussed the following Behavioral Modification  Strategies today: increasing lean protein intake, increasing vegetables and work on meal planning and easy cooking plans, keeping healthy foods in the home, and planning for success   Jennifer Quinn has agreed to follow up with our clinic in 2 weeks. She was informed of the importance of frequent follow up visits to maximize her success with intensive lifestyle modifications for her multiple health conditions.  ALLERGIES: No Known Allergies  MEDICATIONS: Current Outpatient Medications on File Prior to Visit  Medication Sig Dispense Refill   buPROPion (WELLBUTRIN) 100 MG tablet Take 1 tablet (100 mg total) by mouth daily. 30 tablet 0   Cholecalciferol (VITAMIN D PO) Take 1,000 Units by mouth daily.      Fish Oil OIL Take 2,000 mg by mouth daily.       hydrochlorothiazide (MICROZIDE) 12.5 MG capsule Take 1 capsule (12.5 mg total) by mouth 2 (two) times daily. (Patient taking differently: Take 12.5 mg by mouth 2 (two) times daily. ) 180 capsule 2   KLOR-CON M20 20 MEQ tablet TAKE 1 TABLET BY MOUTH EVERY DAY 30 tablet 1   metFORMIN (GLUCOPHAGE) 500 MG tablet Take 1 tablet (500 mg total) by mouth daily with breakfast. 30 tablet 0   Vitamin D, Ergocalciferol, (DRISDOL) 1.25 MG (50000 UT) CAPS capsule Take 1 capsule (50,000 Units total) by mouth every 7 (seven) days. 4 capsule 0   No current facility-administered medications on file prior to visit.     PAST MEDICAL HISTORY: Past Medical History:  Diagnosis Date   Asthma    Chest pain    Chronic constipation    Depression    Fibroid    GERD (gastroesophageal reflux disease)    occ   History of anemia    Hypertension    PMB (postmenopausal bleeding)    Vitamin D deficiency    Wears contact lenses    Wears partial dentures    upper and lower    PAST SURGICAL HISTORY: Past Surgical History:  Procedure Laterality Date   COLONOSCOPY  2007, 01/25/2011. 05/25/16   DILATATION & CURETTAGE/HYSTEROSCOPY WITH MYOSURE N/A 07/23/2018    Procedure: DILATATION & CURETTAGE/HYSTEROSCOPY WITH MYOSURE;  Surgeon: Salvadore Dom, MD;  Location: [facility];  Service: Gynecology;  Laterality: N/A;  Follow 1st case. Use [facility] virtual OR in place of [facility] block time.    SOCIAL HISTORY: Social History   Tobacco Use   Smoking status: Former Smoker    Years: 10.00    Types: Cigarettes    Last attempt to quit: 07/18/1985    Years since quitting: 33.4   Smokeless tobacco: Never Used  Substance Use Topics   Alcohol use: Yes    Alcohol/week: 1.0 standard drinks    Types: 1 Standard drinks or equivalent per week    Comment: seldom   Drug use: Never    FAMILY HISTORY: Family History  Problem Relation Age of Onset   Colon cancer Mother    Diabetes Mother    Obesity Mother    Breast cancer Sister    Stroke Father    Alcoholism Father    Thyroid disease Daughter  Multiple sclerosis Daughter     ROS: Review of Systems  Constitutional: Negative for weight loss.  Gastrointestinal: Negative for nausea.  Endo/Heme/Allergies:       Negative hypoglycemia  Psychiatric/Behavioral: Positive for depression. Negative for suicidal ideas.    PHYSICAL EXAM: Pt in no acute distress  RECENT LABS AND TESTS: BMET    Component Value Date/Time   NA 141 09/04/2018 1223   K 3.8 09/04/2018 1223   CL 99 09/04/2018 1223   CO2 25 09/04/2018 1223   GLUCOSE 86 09/04/2018 1223   GLUCOSE 102 (H) 07/23/2018 0944   BUN 13 09/04/2018 1223   CREATININE 1.02 (H) 09/04/2018 1223   CALCIUM 9.0 09/04/2018 1223   GFRNONAA 58 (L) 09/04/2018 1223   GFRAA 67 09/04/2018 1223   Lab Results  Component Value Date   HGBA1C 6.0 (H) 09/04/2018   HGBA1C 6.1 09/14/2016   HGBA1C 6.1 05/22/2015   HGBA1C 6.4 01/22/2014   Lab Results  Component Value Date   INSULIN 6.4 09/04/2018   CBC    Component Value Date/Time   WBC 5.2 07/23/2018 0944   RBC 4.81 07/23/2018 0944   HGB 13.1 07/23/2018 0944   HCT 41.4 07/23/2018  0944   PLT 228 07/23/2018 0944   MCV 86.1 07/23/2018 0944   MCH 27.2 07/23/2018 0944   MCHC 31.6 07/23/2018 0944   RDW 14.2 07/23/2018 0944   LYMPHSABS 2.0 05/09/2018 1543   MONOABS 0.4 05/09/2018 1543   EOSABS 0.2 05/09/2018 1543   BASOSABS 0.1 05/09/2018 1543   Iron/TIBC/Ferritin/ %Sat No results found for: IRON, TIBC, FERRITIN, IRONPCTSAT Lipid Panel     Component Value Date/Time   CHOL 182 09/04/2018 1223   TRIG 103 09/04/2018 1223   HDL 36 (L) 09/04/2018 1223   CHOLHDL 6 05/09/2018 1543   VLDL 47.6 (H) 05/09/2018 1543   LDLCALC 125 (H) 09/04/2018 1223   LDLDIRECT 157.0 05/09/2018 1543   Hepatic Function Panel     Component Value Date/Time   PROT 6.5 09/04/2018 1223   ALBUMIN 4.1 09/04/2018 1223   AST 14 09/04/2018 1223   ALT 10 09/04/2018 1223   ALKPHOS 63 09/04/2018 1223   BILITOT 0.6 09/04/2018 1223   BILIDIR 0.1 05/09/2018 1543      Component Value Date/Time   TSH 1.85 05/09/2018 1543   TSH 3.44 09/14/2016 1255   TSH 1.53 05/22/2015 1027      I, Trixie Dredge, am acting as Location manager for Ilene Qua, MD  I have reviewed the above documentation for accuracy and completeness, and I agree with the above. - Ilene Qua, MD

## 2019-01-15 ENCOUNTER — Ambulatory Visit (INDEPENDENT_AMBULATORY_CARE_PROVIDER_SITE_OTHER): Payer: PRIVATE HEALTH INSURANCE | Admitting: Family Medicine

## 2019-01-17 ENCOUNTER — Encounter (INDEPENDENT_AMBULATORY_CARE_PROVIDER_SITE_OTHER): Payer: Self-pay

## 2019-01-25 ENCOUNTER — Other Ambulatory Visit (INDEPENDENT_AMBULATORY_CARE_PROVIDER_SITE_OTHER): Payer: Self-pay | Admitting: Family Medicine

## 2019-01-25 DIAGNOSIS — R7303 Prediabetes: Secondary | ICD-10-CM

## 2019-01-25 DIAGNOSIS — F3289 Other specified depressive episodes: Secondary | ICD-10-CM

## 2019-02-27 ENCOUNTER — Ambulatory Visit: Payer: PRIVATE HEALTH INSURANCE | Admitting: Certified Nurse Midwife

## 2019-03-02 ENCOUNTER — Other Ambulatory Visit (INDEPENDENT_AMBULATORY_CARE_PROVIDER_SITE_OTHER): Payer: Self-pay | Admitting: Family Medicine

## 2019-03-02 DIAGNOSIS — F3289 Other specified depressive episodes: Secondary | ICD-10-CM

## 2019-03-02 DIAGNOSIS — R7303 Prediabetes: Secondary | ICD-10-CM

## 2019-05-30 ENCOUNTER — Other Ambulatory Visit (INDEPENDENT_AMBULATORY_CARE_PROVIDER_SITE_OTHER): Payer: Self-pay | Admitting: Family Medicine

## 2019-05-30 DIAGNOSIS — E559 Vitamin D deficiency, unspecified: Secondary | ICD-10-CM

## 2019-06-03 ENCOUNTER — Encounter: Payer: Self-pay | Admitting: Internal Medicine

## 2019-06-03 ENCOUNTER — Other Ambulatory Visit: Payer: Self-pay

## 2019-06-03 ENCOUNTER — Ambulatory Visit (INDEPENDENT_AMBULATORY_CARE_PROVIDER_SITE_OTHER): Payer: PRIVATE HEALTH INSURANCE | Admitting: Internal Medicine

## 2019-06-03 VITALS — BP 144/72 | HR 79 | Temp 98.2°F | Ht 61.0 in | Wt 243.0 lb

## 2019-06-03 DIAGNOSIS — R030 Elevated blood-pressure reading, without diagnosis of hypertension: Secondary | ICD-10-CM | POA: Diagnosis not present

## 2019-06-03 DIAGNOSIS — R7303 Prediabetes: Secondary | ICD-10-CM

## 2019-06-03 DIAGNOSIS — Z0000 Encounter for general adult medical examination without abnormal findings: Secondary | ICD-10-CM

## 2019-06-03 DIAGNOSIS — F3289 Other specified depressive episodes: Secondary | ICD-10-CM

## 2019-06-03 DIAGNOSIS — E559 Vitamin D deficiency, unspecified: Secondary | ICD-10-CM | POA: Diagnosis not present

## 2019-06-03 DIAGNOSIS — E119 Type 2 diabetes mellitus without complications: Secondary | ICD-10-CM

## 2019-06-03 DIAGNOSIS — F3342 Major depressive disorder, recurrent, in full remission: Secondary | ICD-10-CM

## 2019-06-03 DIAGNOSIS — J019 Acute sinusitis, unspecified: Secondary | ICD-10-CM | POA: Insufficient documentation

## 2019-06-03 DIAGNOSIS — J0100 Acute maxillary sinusitis, unspecified: Secondary | ICD-10-CM

## 2019-06-03 MED ORDER — CEFUROXIME AXETIL 250 MG PO TABS: 250.0000 mg | ORAL_TABLET | Freq: Two times a day (BID) | ORAL | 0 refills | Status: DC

## 2019-06-03 MED ORDER — METFORMIN HCL 500 MG PO TABS: 500.0000 mg | ORAL_TABLET | Freq: Every day | ORAL | 3 refills | Status: DC

## 2019-06-03 MED ORDER — BUPROPION HCL 100 MG PO TABS: 100.0000 mg | ORAL_TABLET | Freq: Every day | ORAL | 3 refills | Status: DC

## 2019-06-03 MED ORDER — HYDROCHLOROTHIAZIDE 12.5 MG PO CAPS: 12.5000 mg | ORAL_CAPSULE | Freq: Two times a day (BID) | ORAL | 3 refills | Status: DC

## 2019-06-03 MED ORDER — POTASSIUM CHLORIDE CRYS ER 20 MEQ PO TBCR: 20.0000 meq | EXTENDED_RELEASE_TABLET | Freq: Every day | ORAL | 3 refills | Status: DC

## 2019-06-03 NOTE — Assessment & Plan Note (Signed)
Metformin 

## 2019-06-03 NOTE — Assessment & Plan Note (Signed)
Vit D 

## 2019-06-03 NOTE — Assessment & Plan Note (Signed)
Ceftin

## 2019-06-03 NOTE — Assessment & Plan Note (Signed)
We discussed age appropriate health related issues, including available/recomended screening tests and vaccinations. We discussed a need for adhering to healthy diet and exercise. Labs ordered. All questions were answered. She has a GYN Dr Clotilde Dieter q 12 mo Colon up to date Cardiac CT offered 10/20

## 2019-06-03 NOTE — Progress Notes (Signed)
Subjective:  Patient ID: Jennifer Quinn, female    DOB: October 28, 1955  Age: 63 y.o. MRN: [id number]  CC: No chief complaint on file.   HPI Jennifer Quinn presents for a well exam F/u depression, HTN, DM C/o sinus congestion - green d/c COVID19 (-) weekly  Outpatient Medications Prior to Visit  Medication Sig Dispense Refill  . buPROPion (WELLBUTRIN) 100 MG tablet Take 1 tablet (100 mg total) by mouth daily. 30 tablet 0  . Cholecalciferol (VITAMIN D PO) Take 1,000 Units by mouth daily.     . Fish Oil OIL Take 2,000 mg by mouth daily.      . hydrochlorothiazide (MICROZIDE) 12.5 MG capsule Take 1 capsule (12.5 mg total) by mouth 2 (two) times daily. (Patient taking differently: Take 12.5 mg by mouth 2 (two) times daily. ) 180 capsule 2  . KLOR-CON M20 20 MEQ tablet TAKE 1 TABLET BY MOUTH EVERY DAY 30 tablet 1  . metFORMIN (GLUCOPHAGE) 500 MG tablet Take 1 tablet (500 mg total) by mouth daily with breakfast. 30 tablet 0  . Vitamin D, Ergocalciferol, (DRISDOL) 1.25 MG (50000 UT) CAPS capsule Take 1 capsule (50,000 Units total) by mouth every 7 (seven) days. 4 capsule 0   No facility-administered medications prior to visit.     ROS: Review of Systems  Constitutional: Positive for unexpected weight change. Negative for activity change, appetite change, chills and fatigue.  HENT: Negative for congestion, mouth sores and sinus pressure.   Eyes: Negative for visual disturbance.  Respiratory: Negative for cough and chest tightness.   Gastrointestinal: Negative for abdominal pain and nausea.  Genitourinary: Negative for difficulty urinating, frequency and vaginal pain.  Musculoskeletal: Negative for back pain and gait problem.  Skin: Negative for pallor and rash.  Neurological: Negative for dizziness, tremors, weakness, numbness and headaches.  Psychiatric/Behavioral: Negative for confusion, sleep disturbance and suicidal ideas.    Objective:  BP (!) 144/72 (BP Location: Left Arm, Patient  Position: Sitting, Cuff Size: Large)   Pulse 79   Temp 98.2 F (36.8 C) (Oral)   Ht 5\' 1"  (1.549 m)   Wt 243 lb (110.2 kg)   SpO2 95%   BMI 45.91 kg/m   BP Readings from Last 3 Encounters:  06/03/19 (!) 144/72  09/18/18 (!) 144/80  09/04/18 134/77    Wt Readings from Last 3 Encounters:  06/03/19 243 lb (110.2 kg)  09/18/18 238 lb (108 kg)  09/04/18 237 lb (107.5 kg)    Physical Exam Constitutional:      General: She is not in acute distress.    Appearance: She is well-developed. She is obese.  HENT:     Head: Normocephalic.     Right Ear: External ear normal.     Left Ear: External ear normal.     Nose: Nose normal.  Eyes:     General:        Right eye: No discharge.        Left eye: No discharge.     Conjunctiva/sclera: Conjunctivae normal.     Pupils: Pupils are equal, round, and reactive to light.  Neck:     Musculoskeletal: Normal range of motion and neck supple.     Thyroid: No thyromegaly.     Vascular: No JVD.     Trachea: No tracheal deviation.  Cardiovascular:     Rate and Rhythm: Normal rate and regular rhythm.     Heart sounds: Normal heart sounds.  Pulmonary:     Effort: No  respiratory distress.     Breath sounds: No stridor. No wheezing.  Abdominal:     General: Bowel sounds are normal. There is no distension.     Palpations: Abdomen is soft. There is no mass.     Tenderness: There is no abdominal tenderness. There is no guarding or rebound.  Musculoskeletal:        General: No tenderness.  Lymphadenopathy:     Cervical: No cervical adenopathy.  Skin:    Findings: No erythema or rash.  Neurological:     Cranial Nerves: No cranial nerve deficit.     Motor: No abnormal muscle tone.     Coordination: Coordination normal.     Deep Tendon Reflexes: Reflexes normal.  Psychiatric:        Behavior: Behavior normal.        Thought Content: Thought content normal.        Judgment: Judgment normal.     Lab Results  Component Value Date   WBC  5.2 07/23/2018   HGB 13.1 07/23/2018   HCT 41.4 07/23/2018   PLT 228 07/23/2018   GLUCOSE 86 09/04/2018   CHOL 182 09/04/2018   TRIG 103 09/04/2018   HDL 36 (L) 09/04/2018   LDLDIRECT 157.0 05/09/2018   LDLCALC 125 (H) 09/04/2018   ALT 10 09/04/2018   AST 14 09/04/2018   NA 141 09/04/2018   K 3.8 09/04/2018   CL 99 09/04/2018   CREATININE 1.02 (H) 09/04/2018   BUN 13 09/04/2018   CO2 25 09/04/2018   TSH 1.85 05/09/2018   HGBA1C 6.0 (H) 09/04/2018    No results found.  Assessment & Plan:   There are no diagnoses linked to this encounter.   No orders of the defined types were placed in this encounter.    Follow-up: No follow-ups on file.  Walker Kehr, MD

## 2019-06-03 NOTE — Assessment & Plan Note (Addendum)
Diet, fasting Pt declined labs

## 2019-06-03 NOTE — Patient Instructions (Signed)
Cardiac CT calcium scoring test $150   Computed tomography, more commonly known as a CT or CAT scan, is a diagnostic medical imaging test. Like traditional x-rays, it produces multiple images or pictures of the inside of the body. The cross-sectional images generated during a CT scan can be reformatted in multiple planes. They can even generate three-dimensional images. These images can be viewed on a computer monitor, printed on film or by a 3D printer, or transferred to a CD or DVD. CT images of internal organs, bones, soft tissue and blood vessels provide greater detail than traditional x-rays, particularly of soft tissues and blood vessels. A cardiac CT scan for coronary calcium is a non-invasive way of obtaining information about the presence, location and extent of calcified plaque in the coronary arteries-the vessels that supply oxygen-containing blood to the heart muscle. Calcified plaque results when there is a build-up of fat and other substances under the inner layer of the artery. This material can calcify which signals the presence of atherosclerosis, a disease of the vessel wall, also called coronary artery disease (CAD). People with this disease have an increased risk for heart attacks. In addition, over time, progression of plaque build up (CAD) can narrow the arteries or even close off blood flow to the heart. The result may be chest pain, sometimes called "angina," or a heart attack. Because calcium is a marker of CAD, the amount of calcium detected on a cardiac CT scan is a helpful prognostic tool. The findings on cardiac CT are expressed as a calcium score. Another name for this test is coronary artery calcium scoring.  What are some common uses of the procedure? The goal of cardiac CT scan for calcium scoring is to determine if CAD is present and to what extent, even if there are no symptoms. It is a screening study that may be recommended by a physician for patients with risk factors  for CAD but no clinical symptoms. The major risk factors for CAD are: . high blood cholesterol levels  . family history of heart attacks  . diabetes  . high blood pressure  . cigarette smoking  . overweight or obese  . physical inactivity   A negative cardiac CT scan for calcium scoring shows no calcification within the coronary arteries. This suggests that CAD is absent or so minimal it cannot be seen by this technique. The chance of having a heart attack over the next two to five years is very low under these circumstances. A positive test means that CAD is present, regardless of whether or not the patient is experiencing any symptoms. The amount of calcification-expressed as the calcium score-may help to predict the likelihood of a myocardial infarction (heart attack) in the coming years and helps your medical doctor or cardiologist decide whether the patient may need to take preventive medicine or undertake other measures such as diet and exercise to lower the risk for heart attack. The extent of CAD is graded according to your calcium score:  Calcium Score  Presence of CAD (coronary artery disease)  0 No evidence of CAD   1-10 Minimal evidence of CAD  11-100 Mild evidence of CAD  101-400 Moderate evidence of CAD  Over 400 Extensive evidence of CAD      These suggestions will probably help you to improve your metabolism if you are not overweight and to lose weight if you are overweight: 1.  Reduce your consumption of sugars and starches.  Eliminate high fructose corn syrup from  diet.  Reduce your consumption of processed foods.  For desserts try to have seasonal fruits, berries, nuts, cheeses or dark chocolate with more than 70% cacao. 2.  Do not snack 3.  You do not have to eat breakfast.  If you choose to have breakfast-eat plain greek yogurt, eggs, oatmeal (without sugar) 4.  Drink water, freshly brewed unsweetened tea (green, black or herbal) or coffee.  Do not drink sodas  including diet sodas , juices, beverages sweetened with artificial sweeteners. 5.  Reduce your consumption of refined grains. 6.  Avoid protein drinks such as Optifast, Slim fast etc. Eat chicken, fish, meat, dairy and beans for your sources of protein 7.  Natural unprocessed fats like cold pressed virgin olive oil, butter, coconut oil are good for you.  Eat avocados 8.  Increase your consumption of fiber.  Fruits, berries, vegetables, whole grains, flaxseeds, Chia seeds, beans, popcorn, nuts, oatmeal are good sources of fiber 9.  Use vinegar in your diet, i.e. apple cider vinegar, red wine or balsamic vinegar 10.  You can try fasting.  For example you can skip breakfast and lunch every other day (24-hour fast) 11.  Stress reduction, good night sleep, relaxation, meditation, yoga and other physical activity is likely to help you to maintain low weight too. 12.  If you drink alcohol, limit your alcohol intake to no more than 2 drinks a day.   Mediterranean diet is good for you. ([employer] has a typical Mediterranean cuisine menu) The Mediterranean diet is a way of eating based on the traditional cuisine of countries bordering the [place] Sea. While there is no single definition of the Mediterranean diet, it is typically high in vegetables, fruits, whole grains, beans, nut and seeds, and olive oil. The main components of Mediterranean diet include: . Daily consumption of vegetables, fruits, whole grains and healthy fats  . Weekly intake of fish, poultry, beans and eggs  . Moderate portions of dairy products  . Limited intake of red meat Other important elements of the Mediterranean diet are sharing meals with family and friends, enjoying a glass of red wine and being physically active. Health benefits of a Mediterranean diet: A traditional Mediterranean diet consisting of large quantities of fresh fruits and vegetables, nuts, fish and olive oil-coupled with physical activity-can reduce  your risk of serious mental and physical health problems by: Preventing heart disease and strokes. Following a Mediterranean diet limits your intake of refined breads, processed foods, and red meat, and encourages drinking red wine instead of hard liquor-all factors that can help prevent heart disease and stroke. Keeping you agile. If you're an older adult, the nutrients gained with a Mediterranean diet may reduce your risk of developing muscle weakness and other signs of frailty by about 70 percent. Reducing the risk of Alzheimer's. Research suggests that the Mediterranean diet may improve cholesterol, blood sugar levels, and overall blood vessel health, which in turn may reduce your risk of Alzheimer's disease or dementia. Halving the risk of Parkinson's disease. The high levels of antioxidants in the Mediterranean diet can prevent cells from undergoing a damaging process called oxidative stress, thereby cutting the risk of Parkinson's disease in half. Increasing longevity. By reducing your risk of developing heart disease or cancer with the Mediterranean diet, you're reducing your risk of death at any age by 20%. Protecting against type 2 diabetes. A Mediterranean diet is rich in fiber which digests slowly, prevents huge swings in blood sugar, and can help you maintain a healthy   weight.    Cabbage soup recipe that will not make you gain weight: Take 1 small head of cabbage, 1 average pack of celery, 4 green peppers, 4 onions, 2 cans diced tomatoes (they are not available without salt), salt and spices to taste.  Chop cabbage, celery, peppers and onions.  And tomatoes and 2-2.5 liters (2.5 quarts) of water so that it would just cover the vegetables.  Bring to boil.  Add spices and salt.  Turn heat to low/medium and simmer for 20-25 minutes.  Naturally, you can make a smaller batch and change some of the ingredients.  

## 2019-06-04 ENCOUNTER — Encounter: Payer: Self-pay | Admitting: Internal Medicine

## 2019-10-15 ENCOUNTER — Encounter: Payer: Self-pay | Admitting: Family Medicine

## 2019-10-15 ENCOUNTER — Ambulatory Visit: Payer: PRIVATE HEALTH INSURANCE | Admitting: Family Medicine

## 2019-10-15 VITALS — BP 140/80 | HR 65 | Ht 62.0 in | Wt 247.0 lb

## 2019-10-15 DIAGNOSIS — Z789 Other specified health status: Secondary | ICD-10-CM

## 2019-10-15 NOTE — Progress Notes (Signed)
Subjective:     Patient ID: Jennifer Quinn, female   DOB: 28-Nov-1955, 64 y.o.   MRN: [id number]  HPI Marlaine [facility] for her required wellness visit for her insurance. Her PCP is Dr. Alain Marion, who she last saw in Oct. 2020. Her colonoscopy and pap smear are UTD. She is due for her mammogram. She reports she needs to work on healthy eating and exercising. She reports regularly seeing dentist and eye doctor. She denies any current problems or concerns.   Past Medical History:  Diagnosis Date  . Asthma   . Chest pain   . Chronic constipation   . Depression   . Fibroid   . GERD (gastroesophageal reflux disease)    occ  . History of anemia   . Hypertension   . PMB (postmenopausal bleeding)   . Vitamin D deficiency   . Wears contact lenses   . Wears partial dentures    upper and lower   No Known Allergies  Current Outpatient Medications:  .  buPROPion (WELLBUTRIN) 100 MG tablet, Take 1 tablet (100 mg total) by mouth daily., Disp: 90 tablet, Rfl: 3 .  cefUROXime (CEFTIN) 250 MG tablet, Take 1 tablet (250 mg total) by mouth 2 (two) times daily., Disp: 20 tablet, Rfl: 0 .  Cholecalciferol (VITAMIN D PO), Take 1,000 Units by mouth daily. , Disp: , Rfl:  .  Fish Oil OIL, Take 2,000 mg by mouth daily.  , Disp: , Rfl:  .  hydrochlorothiazide (MICROZIDE) 12.5 MG capsule, Take 1 capsule (12.5 mg total) by mouth 2 (two) times daily., Disp: 180 capsule, Rfl: 3 .  metFORMIN (GLUCOPHAGE) 500 MG tablet, Take 1 tablet (500 mg total) by mouth daily with breakfast., Disp: 90 tablet, Rfl: 3 .  potassium chloride SA (KLOR-CON M20) 20 MEQ tablet, Take 1 tablet (20 mEq total) by mouth daily., Disp: 90 tablet, Rfl: 3 .  Vitamin D, Ergocalciferol, (DRISDOL) 1.25 MG (50000 UT) CAPS capsule, Take 1 capsule (50,000 Units total) by mouth every 7 (seven) days., Disp: 4 capsule, Rfl: 0   Review of Systems  Constitutional: Negative for chills, fatigue, fever and unexpected  weight change.  HENT: Negative for congestion, ear pain, sinus pressure, sinus pain and sore throat.   Eyes: Negative for discharge and visual disturbance.  Respiratory: Negative for cough, shortness of breath and wheezing.   Cardiovascular: Negative for chest pain and leg swelling.  Gastrointestinal: Negative for abdominal pain, blood in stool, constipation, diarrhea, nausea and vomiting.  Genitourinary: Negative for difficulty urinating and hematuria.  Skin: Negative for color change.  Neurological: Negative for dizziness, weakness, light-headedness and headaches.  Hematological: Negative for adenopathy.  All other systems reviewed and are negative.      Objective:   Physical Exam Vitals reviewed.  Constitutional:      General: She is not in acute distress.    Appearance: Normal appearance. She is well-developed. She is not toxic-appearing.  HENT:     Head: Normocephalic and atraumatic.  Eyes:     General:        Right eye: No discharge.        Left eye: No discharge.  Cardiovascular:     Rate and Rhythm: Normal rate and regular rhythm.     Heart sounds: Normal heart sounds. No murmur.  Pulmonary:     Effort: Pulmonary effort is normal. No respiratory distress.     Breath sounds: Normal breath sounds.  Musculoskeletal:  Cervical back: Neck supple.  Skin:    General: Skin is warm and dry.  Neurological:     Mental Status: She is alert and oriented to person, place, and time.  Psychiatric:        Mood and Affect: Mood normal.        Behavior: Behavior normal.    Today's Vitals   10/15/19 1406  BP: 140/80  Pulse: 65  SpO2: 96%  Weight: 247 lb (112 kg)  Height: 5\' 2"  (1.575 m)   Body mass index is 45.18 kg/m.      Assessment:     Participant in health and wellness plan      Plan:     1. BP elevated today. Instructed to check BP at home or work and if remains consistently elevated over 140/90 she should f/u. She verbalized understanding. 2. Encouraged  healthy diet and exercise for weight loss. Discussed DASH diet. 3. Keep all regular appts with PCP.  4. Mammogram scheduled with mobile mammogram unit coming to [facility] on 3/25.  5. F/u here prn.

## 2019-11-12 ENCOUNTER — Encounter: Payer: Self-pay | Admitting: Certified Nurse Midwife

## 2019-11-14 LAB — HM MAMMOGRAPHY

## 2019-12-20 ENCOUNTER — Encounter: Payer: Self-pay | Admitting: Internal Medicine

## 2020-05-21 MED FILL — FLUAD QUADRIVALENT 0.5 ML P: 0.5 | 1 days supply | Qty: 1 | Fill #0

## 2020-07-20 ENCOUNTER — Telehealth: Payer: Self-pay

## 2020-07-20 DIAGNOSIS — N950 Postmenopausal bleeding: Secondary | ICD-10-CM

## 2020-07-20 DIAGNOSIS — N840 Polyp of corpus uteri: Secondary | ICD-10-CM

## 2020-07-20 NOTE — Telephone Encounter (Signed)
Reviewed with Dr. Talbert Nan. Call returned to patient. OV scheduled for further evaluation on 11/30 at 2pm. Will keep current order for [facility], will further discuss at [place]. Patient verbalizes understanding and is agreeable.   Routing to provider for final review. Patient is agreeable to disposition. Will close encounter.  Cc: Hayley Carder

## 2020-07-20 NOTE — Telephone Encounter (Signed)
She has a h/o endometrial polyps and had a hysteroscopy, D&C 2 years ago. Please set her up for a sonohysterogram for possible recurrence of polyps.

## 2020-07-20 NOTE — Telephone Encounter (Signed)
Patient states that she started to have PMB over the weekend. Ok to call number on file.

## 2020-07-20 NOTE — Telephone Encounter (Signed)
Spoke with patient, advised as seen below per Dr. Talbert Nan. Patient agreeable to proceed with [employer]. OV cancelled. [facility] scheduled for 07/21/20 at 1:30pm, consult to follow with Dr. Talbert Nan. Order placed for precert. Patient verbalizes understanding and is agreeable.   Message to business office for precert.

## 2020-07-20 NOTE — Telephone Encounter (Signed)
Spoke with patient. Patient is postmenopausal. Reports dark brown spotting on 11/25 followed by pink discharge to spotting with small dime size clots on 11/28. Denies any other symptoms. Hx of PMB, PUS 06/2018 followed by [facility] 07/2018. Benign pathology.   OV scheduled for 11/30 at 2pm with Dr. Talbert Nan. Advised I will review with Dr. Talbert Nan and return call if any additional recommendations, such as PUS. Patient verbalizes understanding and is agreeable.   Dr. Talbert Nan -please review. Scheduled for OV, please advise if this needs to be changed to a PUS.

## 2020-07-21 ENCOUNTER — Encounter: Payer: Self-pay | Admitting: Obstetrics and Gynecology

## 2020-07-21 ENCOUNTER — Ambulatory Visit: Payer: PRIVATE HEALTH INSURANCE | Admitting: Obstetrics and Gynecology

## 2020-07-21 ENCOUNTER — Other Ambulatory Visit: Payer: PRIVATE HEALTH INSURANCE

## 2020-07-21 ENCOUNTER — Other Ambulatory Visit: Payer: PRIVATE HEALTH INSURANCE | Admitting: Obstetrics and Gynecology

## 2020-07-21 ENCOUNTER — Other Ambulatory Visit (HOSPITAL_COMMUNITY)
Admission: RE | Admit: 2020-07-21 | Discharge: 2020-07-21 | Disposition: A | Discharge status: Home / Self Care | Payer: PRIVATE HEALTH INSURANCE | Source: Ambulatory Visit | Attending: Obstetrics and Gynecology | Admitting: Obstetrics and Gynecology

## 2020-07-21 ENCOUNTER — Other Ambulatory Visit: Payer: Self-pay

## 2020-07-21 VITALS — BP 132/70 | HR 68 | Resp 16 | Ht 61.75 in | Wt 228.0 lb

## 2020-07-21 DIAGNOSIS — R3915 Urgency of urination: Secondary | ICD-10-CM

## 2020-07-21 DIAGNOSIS — N950 Postmenopausal bleeding: Secondary | ICD-10-CM

## 2020-07-21 DIAGNOSIS — Z124 Encounter for screening for malignant neoplasm of cervix: Secondary | ICD-10-CM | POA: Diagnosis not present

## 2020-07-21 DIAGNOSIS — R350 Frequency of micturition: Secondary | ICD-10-CM | POA: Diagnosis not present

## 2020-07-21 NOTE — Progress Notes (Signed)
GYNECOLOGY  VISIT   HPI: 64 y.o.   Married Black or [place] American Not Hispanic or Latino  female   [phone] with No LMP recorded. Patient is postmenopausal.   here for PMB. Per patient, started bleeding the night of 07/15/20 with a "brownish discharge", it only lasted one night. She was sexually active a few days later, it was slightly uncomfortable and she had some pink spotting.  She c/o mild increase in urinary frequency in the last week, some urgency. Voiding normal amounts. She isn't completely sure where the blood is coming form. No abdominal pain. Patient denies any fever or chills. In 12/19 the patient had a hysteroscopy, polypectomy, D&C. Pathology was benign.   GYNECOLOGIC HISTORY: No LMP recorded. Patient is postmenopausal. Contraception:Postmenopausal Menopausal hormone therapy: none        OB History    Gravida  4   Para  3   Term  3   Preterm      AB  1   Living  3     SAB  1   TAB      Ectopic      Multiple      Live Births                 Patient Active Problem List   Diagnosis Date Noted  . Acute sinusitis 06/03/2019  . Diabetes ([place]) 06/03/2019  . Knee pain, left 05/09/2018  . Paresthesia 09/13/2016  . Nipple lesion 09/23/2015  . GERD (gastroesophageal reflux disease) 05/22/2015  . Chest pain, atypical 05/22/2015  . Well adult exam 12/12/2011  . Anxiety 12/12/2011  . Constipation 12/12/2011  . ELEVATED BLOOD PRESSURE 09/03/2010  . COLONIC POLYPS, HX OF 09/03/2010  . ALLERGIC RHINITIS 12/15/2009  . VERTIGO 04/25/2008  . Vitamin D deficiency 08/28/2007  . OBESITY, MORBID 08/28/2007  . Edema 08/28/2007  . WEIGHT GAIN 08/28/2007  . DEPRESSION 06/09/2007    Past Medical History:  Diagnosis Date  . Asthma   . Chest pain   . Chronic constipation   . Depression   . Fibroid   . GERD (gastroesophageal reflux disease)    occ  . History of anemia   . Hypertension   . PMB (postmenopausal bleeding)   . Vitamin D deficiency   . Wears  contact lenses   . Wears partial dentures    upper and lower    Past Surgical History:  Procedure Laterality Date  . COLONOSCOPY  2007, 01/25/2011. 05/25/16  . DILATATION & CURETTAGE/HYSTEROSCOPY WITH MYOSURE N/A 07/23/2018   Procedure: DILATATION & CURETTAGE/HYSTEROSCOPY WITH MYOSURE;  Surgeon: Salvadore Dom, MD;  Location: [facility];  Service: Gynecology;  Laterality: N/A;  Follow 1st case. Use [facility] virtual OR in place of [facility] block time.    Current Outpatient Medications  Medication Sig Dispense Refill  . buPROPion (WELLBUTRIN) 100 MG tablet Take 1 tablet (100 mg total) by mouth daily. 90 tablet 3  . Cholecalciferol (VITAMIN D PO) Take 1,000 Units by mouth daily.     . Fish Oil OIL Take 2,000 mg by mouth daily.      . hydrochlorothiazide (MICROZIDE) 12.5 MG capsule Take 1 capsule (12.5 mg total) by mouth 2 (two) times daily. 180 capsule 3  . ibuprofen (ADVIL) 800 MG tablet Take 800 mg by mouth 3 (three) times daily.    . metFORMIN (GLUCOPHAGE) 500 MG tablet Take 1 tablet (500 mg total) by mouth daily with breakfast. 90 tablet 3  . potassium chloride SA (  KLOR-CON M20) 20 MEQ tablet Take 1 tablet (20 mEq total) by mouth daily. 90 tablet 3   No current facility-administered medications for this visit.     ALLERGIES: Patient has no known allergies.  Family History  Problem Relation Age of Onset  . Colon cancer Mother   . Diabetes Mother   . Obesity Mother   . Breast cancer Sister   . Stroke Father   . Alcoholism Father   . Thyroid disease Daughter   . Multiple sclerosis Daughter     Social History   Socioeconomic History  . Marital status: Married    Spouse name: Fumiye Lubben  . Number of children: 2  . Years of education: Not on file  . Highest education level: Not on file  Occupational History  . Not on file  Tobacco Use  . Smoking status: Former Smoker    Years: 10.00    Types: Cigarettes    Quit date: 07/18/1985    Years since quitting: 35.0   . Smokeless tobacco: Never Used  Vaping Use  . Vaping Use: Never used  Substance and Sexual Activity  . Alcohol use: Yes    Alcohol/week: 1.0 standard drink    Types: 1 Standard drinks or equivalent per week    Comment: seldom  . Drug use: Never  . Sexual activity: Yes    Partners: Male    Birth control/protection: Post-menopausal  Other Topics Concern  . Not on file  Social History Narrative  . Not on file   Social Determinants of Health   Financial Resource Strain:   . Difficulty of Paying Living Expenses: Not on file  Food Insecurity:   . Worried About Charity fundraiser in the Last Year: Not on file  . Ran Out of Food in the Last Year: Not on file  Transportation Needs:   . Lack of Transportation (Medical): Not on file  . Lack of Transportation (Non-Medical): Not on file  Physical Activity:   . Days of Exercise per Week: Not on file  . Minutes of Exercise per Session: Not on file  Stress:   . Feeling of Stress : Not on file  Social Connections:   . Frequency of Communication with Friends and Family: Not on file  . Frequency of Social Gatherings with Friends and Family: Not on file  . Attends Religious Services: Not on file  . Active Member of Clubs or Organizations: Not on file  . Attends Archivist Meetings: Not on file  . Marital Status: Not on file  Intimate Partner Violence:   . Fear of Current or Ex-Partner: Not on file  . Emotionally Abused: Not on file  . Physically Abused: Not on file  . Sexually Abused: Not on file    Review of Systems  Constitutional: Negative.   HENT: Negative.   Eyes: Negative.   Respiratory: Negative.   Cardiovascular: Negative.   Gastrointestinal: Negative.   Genitourinary:       Bleeding   Musculoskeletal: Negative.   Skin: Negative.   Neurological: Negative.   Endo/Heme/Allergies: Negative.   Psychiatric/Behavioral: Negative.     PHYSICAL EXAMINATION:    BP 132/70 (BP Location: Right Arm, Patient  Position: Sitting, Cuff Size: Large)   Pulse 68   Resp 16   Ht 5' 1.75" (1.568 m)   Wt 228 lb (103.4 kg)   BMI 42.04 kg/m     General appearance: alert, cooperative and appears stated age Abdomen: soft, non-tender; non distended, no masses,  no organomegaly  Pelvic: External genitalia:  no lesions              Urethra:  normal appearing urethra with no masses, tenderness or lesions              Bartholins and Skenes: normal                 Vagina: normal appearing vagina with normal color and discharge, no lesions. Well estrogenized.               Cervix: no lesions. No polyp, not friable.               Bimanual Exam:  Uterus:  anteverted, irregularly shaped, enlarged (c/w known fibroids), not tender. ~8-10 week sized              Adnexa: no mass, fullness, tenderness               Chaperone was present for exam.  ASSESSMENT PMP bleeding H/O endometrial polyps Urinary frequency and urgency Fibroid uterus    PLAN Pap Urine for ua, c&s Return for ultrasound, possible sonohysterogram Further plans depending on results.

## 2020-07-22 LAB — URINALYSIS, MICROSCOPIC ONLY
Bacteria, UA: NONE SEEN
Casts: NONE SEEN /lpf
WBC, UA: NONE SEEN /hpf (ref 0–5)

## 2020-07-22 LAB — CYTOLOGY - PAP
Comment: NEGATIVE
Diagnosis: NEGATIVE
High risk HPV: NEGATIVE

## 2020-07-23 ENCOUNTER — Telehealth: Payer: Self-pay

## 2020-07-23 LAB — URINE CULTURE

## 2020-07-23 NOTE — Telephone Encounter (Signed)
Spoke with patient regarding benefits for recommended sonohysterogram. Patient acknowledges understanding of information presented. Patient is aware of cancellation policy. Patient scheduled appointment for 07/28/2020 at 1230PM with Sumner Boast, MD. Encounter closed.

## 2020-07-28 ENCOUNTER — Other Ambulatory Visit (HOSPITAL_COMMUNITY)
Admission: RE | Admit: 2020-07-28 | Discharge: 2020-07-28 | Disposition: A | Discharge status: Home / Self Care | Payer: PRIVATE HEALTH INSURANCE | Source: Ambulatory Visit | Attending: Obstetrics and Gynecology | Admitting: Obstetrics and Gynecology

## 2020-07-28 ENCOUNTER — Other Ambulatory Visit: Payer: Self-pay

## 2020-07-28 ENCOUNTER — Encounter: Payer: Self-pay | Admitting: Obstetrics and Gynecology

## 2020-07-28 ENCOUNTER — Ambulatory Visit: Payer: PRIVATE HEALTH INSURANCE | Admitting: Obstetrics and Gynecology

## 2020-07-28 ENCOUNTER — Ambulatory Visit (INDEPENDENT_AMBULATORY_CARE_PROVIDER_SITE_OTHER): Payer: PRIVATE HEALTH INSURANCE

## 2020-07-28 ENCOUNTER — Other Ambulatory Visit: Payer: Self-pay | Admitting: Obstetrics and Gynecology

## 2020-07-28 VITALS — BP 132/62 | HR 62 | Ht 62.0 in | Wt 228.0 lb

## 2020-07-28 DIAGNOSIS — N950 Postmenopausal bleeding: Secondary | ICD-10-CM | POA: Diagnosis present

## 2020-07-28 DIAGNOSIS — N840 Polyp of corpus uteri: Secondary | ICD-10-CM

## 2020-07-28 DIAGNOSIS — N882 Stricture and stenosis of cervix uteri: Secondary | ICD-10-CM | POA: Diagnosis not present

## 2020-07-28 NOTE — Progress Notes (Signed)
GYNECOLOGY  VISIT   HPI: 64 y.o.   Married Black or [place] American Not Hispanic or Latino  female   [phone] with No LMP recorded. Patient is postmenopausal.   here for evaluation of PMP bleeding.  She has a known fibroid uterus. H/O hysteroscopy, polypectomy, D&C in 12/19 with negative pathology.   GYNECOLOGIC HISTORY: No LMP recorded. Patient is postmenopausal. Contraception:PMP Menopausal hormone therapy: none        OB History    Gravida  4   Para  3   Term  3   Preterm      AB  1   Living  3     SAB  1   TAB      Ectopic      Multiple      Live Births                 Patient Active Problem List   Diagnosis Date Noted  . Acute sinusitis 06/03/2019  . Diabetes ([place]) 06/03/2019  . Knee pain, left 05/09/2018  . Paresthesia 09/13/2016  . Nipple lesion 09/23/2015  . GERD (gastroesophageal reflux disease) 05/22/2015  . Chest pain, atypical 05/22/2015  . Well adult exam 12/12/2011  . Anxiety 12/12/2011  . Constipation 12/12/2011  . ELEVATED BLOOD PRESSURE 09/03/2010  . COLONIC POLYPS, HX OF 09/03/2010  . ALLERGIC RHINITIS 12/15/2009  . VERTIGO 04/25/2008  . Vitamin D deficiency 08/28/2007  . OBESITY, MORBID 08/28/2007  . Edema 08/28/2007  . WEIGHT GAIN 08/28/2007  . DEPRESSION 06/09/2007    Past Medical History:  Diagnosis Date  . Asthma   . Chest pain   . Chronic constipation   . Depression   . Fibroid   . GERD (gastroesophageal reflux disease)    occ  . History of anemia   . Hypertension   . PMB (postmenopausal bleeding)   . Vitamin D deficiency   . Wears contact lenses   . Wears partial dentures    upper and lower    Past Surgical History:  Procedure Laterality Date  . COLONOSCOPY  2007, 01/25/2011. 05/25/16  . DILATATION & CURETTAGE/HYSTEROSCOPY WITH MYOSURE N/A 07/23/2018   Procedure: DILATATION & CURETTAGE/HYSTEROSCOPY WITH MYOSURE;  Surgeon: Salvadore Dom, MD;  Location: [facility];  Service: Gynecology;   Laterality: N/A;  Follow 1st case. Use [facility] virtual [facility] in place of [facility] block time.    Current Outpatient Medications  Medication Sig Dispense Refill  . buPROPion (WELLBUTRIN) 100 MG tablet Take 1 tablet (100 mg total) by mouth daily. 90 tablet 3  . Cholecalciferol (VITAMIN D PO) Take 1,000 Units by mouth daily.     . Fish Oil OIL Take 2,000 mg by mouth daily.      . hydrochlorothiazide (MICROZIDE) 12.5 MG capsule Take 1 capsule (12.5 mg total) by mouth 2 (two) times daily. 180 capsule 3  . ibuprofen (ADVIL) 800 MG tablet Take 800 mg by mouth 3 (three) times daily.    . metFORMIN (GLUCOPHAGE) 500 MG tablet Take 1 tablet (500 mg total) by mouth daily with breakfast. 90 tablet 3  . potassium chloride SA (KLOR-CON M20) 20 MEQ tablet Take 1 tablet (20 mEq total) by mouth daily. 90 tablet 3   No current facility-administered medications for this visit.     ALLERGIES: Patient has no known allergies.  Family History  Problem Relation Age of Onset  . Colon cancer Mother   . Diabetes Mother   . Obesity Mother   . Breast  cancer Sister   . Stroke Father   . Alcoholism Father   . Thyroid disease Daughter   . Multiple sclerosis Daughter     Social History   Socioeconomic History  . Marital status: Married    Spouse name: Danetta Prom  . Number of children: 2  . Years of education: Not on file  . Highest education level: Not on file  Occupational History  . Not on file  Tobacco Use  . Smoking status: Former Smoker    Years: 10.00    Types: Cigarettes    Quit date: 07/18/1985    Years since quitting: 35.0  . Smokeless tobacco: Never Used  Vaping Use  . Vaping Use: Never used  Substance and Sexual Activity  . Alcohol use: Yes    Alcohol/week: 1.0 standard drink    Types: 1 Standard drinks or equivalent per week    Comment: seldom  . Drug use: Never  . Sexual activity: Yes    Partners: Male    Birth control/protection: Post-menopausal  Other Topics Concern  . Not on file   Social History Narrative  . Not on file   Social Determinants of Health   Financial Resource Strain:   . Difficulty of Paying Living Expenses: Not on file  Food Insecurity:   . Worried About Charity fundraiser in the Last Year: Not on file  . Ran Out of Food in the Last Year: Not on file  Transportation Needs:   . Lack of Transportation (Medical): Not on file  . Lack of Transportation (Non-Medical): Not on file  Physical Activity:   . Days of Exercise per Week: Not on file  . Minutes of Exercise per Session: Not on file  Stress:   . Feeling of Stress : Not on file  Social Connections:   . Frequency of Communication with Friends and Family: Not on file  . Frequency of Social Gatherings with Friends and Family: Not on file  . Attends Religious Services: Not on file  . Active Member of Clubs or Organizations: Not on file  . Attends Archivist Meetings: Not on file  . Marital Status: Not on file  Intimate Partner Violence:   . Fear of Current or Ex-Partner: Not on file  . Emotionally Abused: Not on file  . Physically Abused: Not on file  . Sexually Abused: Not on file    Review of Systems  All other systems reviewed and are negative.   PHYSICAL EXAMINATION:    BP 132/62   Pulse 62   Ht 5\' 2"  (1.575 m)   Wt 228 lb (103.4 kg)   SpO2 98%   BMI 41.70 kg/m     General appearance: alert, cooperative and appears stated age  Pelvic: External genitalia:  no lesions              Urethra:  normal appearing urethra with no masses, tenderness or lesions              Bartholins and Skenes: normal                 Vagina: normal appearing vagina with normal color and discharge, no lesions              Cervix no lesions, stenotic  Sonohysterogram and endometrial biopsy risks were reviewed, consents was signed.  A speculum was placed in the vagina and the cervix was cleansed with Hibiclens. A tenaculum was placed at 12 o'clock. The cervix was dilated  with the  mini-dilators under ultrasound guidance. With her fibroids her uterine cavity was deviated towards her left. The uterine catheter was inserted into the uterine cavity. Saline was infused under direct observation with the ultrasound. No intracavitary defects were noted. The fluid was removed and the uterine evacuator was used to perform the endometrial biopsy taking care to get a representative sample, sampling 360 degrees of the uterine cavity. Minimal tissue was obtained. The tenaculum and speculum were removed. There were no complications.    Chaperone was present for exam.  Ultrasound and sonohysterogram images were reviewed with the patient. I remeasured the endometrium and got a stripe over 5 mm.    ASSESSMENT PMP bleeding, normal pap last week Sonohysterogram without defects Cervical stenosis Fibroid uterus    PLAN Endometrial biopsy performed Further plans depending on results

## 2020-07-28 NOTE — Patient Instructions (Signed)

## 2020-07-30 ENCOUNTER — Telehealth: Payer: Self-pay

## 2020-07-30 LAB — SURGICAL PATHOLOGY

## 2020-07-30 NOTE — Telephone Encounter (Signed)
Left message to call Curren Mohrmann, CMA. °

## 2020-07-30 NOTE — Telephone Encounter (Signed)
-----   Message from Salvadore Dom, MD sent at 07/30/2020  2:14 PM EST ----- Please let the patient know that her endometrial biopsy is benign with inactive endometrium (good news). She should call with any further bleeding.

## 2020-08-05 ENCOUNTER — Other Ambulatory Visit: Payer: Self-pay | Admitting: Internal Medicine

## 2020-08-05 DIAGNOSIS — F3289 Other specified depressive episodes: Secondary | ICD-10-CM

## 2020-08-05 DIAGNOSIS — R7303 Prediabetes: Secondary | ICD-10-CM

## 2020-08-13 ENCOUNTER — Other Ambulatory Visit: Payer: Self-pay

## 2020-08-17 ENCOUNTER — Encounter: Payer: PRIVATE HEALTH INSURANCE | Admitting: Internal Medicine

## 2020-08-18 ENCOUNTER — Other Ambulatory Visit: Payer: Self-pay

## 2020-08-18 ENCOUNTER — Encounter: Payer: Self-pay | Admitting: Internal Medicine

## 2020-08-18 ENCOUNTER — Ambulatory Visit (INDEPENDENT_AMBULATORY_CARE_PROVIDER_SITE_OTHER): Payer: PRIVATE HEALTH INSURANCE | Admitting: Internal Medicine

## 2020-08-18 VITALS — BP 144/82 | HR 64 | Ht 62.0 in | Wt 230.0 lb

## 2020-08-18 DIAGNOSIS — Z23 Encounter for immunization: Secondary | ICD-10-CM

## 2020-08-18 DIAGNOSIS — R739 Hyperglycemia, unspecified: Secondary | ICD-10-CM

## 2020-08-18 DIAGNOSIS — Z0000 Encounter for general adult medical examination without abnormal findings: Secondary | ICD-10-CM | POA: Diagnosis not present

## 2020-08-18 NOTE — Assessment & Plan Note (Signed)
We discussed age appropriate health related issues, including available/recomended screening tests and vaccinations. We discussed a need for adhering to healthy diet and exercise. Labs ordered. All questions were answered. She has a GYN Dr Arlee Muslim q 12 mo Colon up to date Cardiac CT offered  Prevnar

## 2020-08-18 NOTE — Patient Instructions (Signed)

## 2020-08-18 NOTE — Progress Notes (Signed)
Subjective:  Patient ID: Jennifer Quinn, female    DOB: 28-Jan-1956  Age: 64 y.o. MRN: [id number]  CC: Annual Exam   HPI AREATHA KALATA presents for a well exam.  Margret is working 7 d/wk - 2 jobs: Working 110 h/wk and loving it! 1 wknd off per month   Outpatient Medications Prior to Visit  Medication Sig Dispense Refill  . buPROPion (WELLBUTRIN) 100 MG tablet Take 1 tablet (100 mg total) by mouth daily. Overdue for Annual appt w/labs must see provider for future refills 30 tablet 0  . Cholecalciferol (VITAMIN D PO) Take 1,000 Units by mouth daily.     . Fish Oil OIL Take 2,000 mg by mouth daily.    . hydrochlorothiazide (MICROZIDE) 12.5 MG capsule Take 1 capsule (12.5 mg total) by mouth 2 (two) times daily. 180 capsule 3  . ibuprofen (ADVIL) 800 MG tablet Take 800 mg by mouth 3 (three) times daily.    . metFORMIN (GLUCOPHAGE) 500 MG tablet Take 1 tablet (500 mg total) by mouth daily with breakfast. Overdue for Annual appt w/labs must see provider for future refills 30 tablet 0  . potassium chloride SA (KLOR-CON M20) 20 MEQ tablet Take 1 tablet (20 mEq total) by mouth daily. Overdue for Annual appt w/labs must see provider for future refills 30 tablet 0   No facility-administered medications prior to visit.    ROS: Review of Systems  Constitutional: Negative for activity change, appetite change, chills, fatigue and unexpected weight change.  HENT: Negative for congestion, mouth sores and sinus pressure.   Eyes: Negative for visual disturbance.  Respiratory: Negative for cough and chest tightness.   Gastrointestinal: Negative for abdominal pain and nausea.  Genitourinary: Negative for difficulty urinating, frequency and vaginal pain.  Musculoskeletal: Negative for back pain and gait problem.  Skin: Negative for pallor and rash.  Neurological: Negative for dizziness, tremors, weakness, numbness and headaches.  Psychiatric/Behavioral: Negative for confusion and sleep disturbance. The  patient is not nervous/anxious.     Objective:  BP (!) 144/82   Pulse 64   Ht 5\' 2"  (1.575 m)   Wt 230 lb (104.3 kg)   SpO2 98%   BMI 42.07 kg/m   BP Readings from Last 3 Encounters:  08/18/20 (!) 144/82  07/28/20 132/62  07/21/20 132/70    Wt Readings from Last 3 Encounters:  08/18/20 230 lb (104.3 kg)  07/28/20 228 lb (103.4 kg)  07/21/20 228 lb (103.4 kg)    Physical Exam  Lab Results  Component Value Date   WBC 5.2 07/23/2018   HGB 13.1 07/23/2018   HCT 41.4 07/23/2018   PLT 228 07/23/2018   GLUCOSE 86 09/04/2018   CHOL 182 09/04/2018   TRIG 103 09/04/2018   HDL 36 (L) 09/04/2018   LDLDIRECT 157.0 05/09/2018   LDLCALC 125 (H) 09/04/2018   ALT 10 09/04/2018   AST 14 09/04/2018   NA 141 09/04/2018   K 3.8 09/04/2018   CL 99 09/04/2018   CREATININE 1.02 (H) 09/04/2018   BUN 13 09/04/2018   CO2 25 09/04/2018   TSH 1.85 05/09/2018   HGBA1C 6.0 (H) 09/04/2018    09/06/2018 Transvaginal Non-OB  Result Date: 07/28/2020 SEE PROGRESS NOTES FOR RESULTS  14/02/2020 Pelvis Complete  Result Date: 07/28/2020 SEE PROGRESS NOTES FOR RESULTS  14/02/2020 Sonohysterogram  Result Date: 07/28/2020 SEE PROGRESS NOTES FOR RESULTS   Assessment & Plan:   There are no diagnoses linked to this encounter.   No orders of  the defined types were placed in this encounter.    Follow-up: No follow-ups on file.  Walker Kehr, MD

## 2020-08-26 ENCOUNTER — Other Ambulatory Visit (INDEPENDENT_AMBULATORY_CARE_PROVIDER_SITE_OTHER): Payer: PRIVATE HEALTH INSURANCE

## 2020-08-26 DIAGNOSIS — R202 Paresthesia of skin: Secondary | ICD-10-CM | POA: Diagnosis not present

## 2020-08-26 DIAGNOSIS — Z0000 Encounter for general adult medical examination without abnormal findings: Secondary | ICD-10-CM | POA: Diagnosis not present

## 2020-08-26 DIAGNOSIS — E559 Vitamin D deficiency, unspecified: Secondary | ICD-10-CM

## 2020-08-26 DIAGNOSIS — R739 Hyperglycemia, unspecified: Secondary | ICD-10-CM

## 2020-08-26 LAB — CBC WITH DIFFERENTIAL/PLATELET
Basophils Absolute: 0.1 10*3/uL (ref 0.0–0.1)
Basophils Relative: 1.2 % (ref 0.0–3.0)
Eosinophils Absolute: 0.2 10*3/uL (ref 0.0–0.7)
Eosinophils Relative: 3.8 % (ref 0.0–5.0)
HCT: 40.2 % (ref 36.0–46.0)
Hemoglobin: 13.2 g/dL (ref 12.0–15.0)
Lymphocytes Relative: 39.7 % (ref 12.0–46.0)
Lymphs Abs: 1.8 10*3/uL (ref 0.7–4.0)
MCHC: 32.8 g/dL (ref 30.0–36.0)
MCV: 82.6 fl (ref 78.0–100.0)
Monocytes Absolute: 0.5 10*3/uL (ref 0.1–1.0)
Monocytes Relative: 9.9 % (ref 3.0–12.0)
Neutro Abs: 2.1 10*3/uL (ref 1.4–7.7)
Neutrophils Relative %: 45.4 % (ref 43.0–77.0)
Platelets: 201 10*3/uL (ref 150.0–400.0)
RBC: 4.87 Mil/uL (ref 3.87–5.11)
RDW: 15.2 % (ref 11.5–15.5)
WBC: 4.6 10*3/uL (ref 4.0–10.5)

## 2020-08-26 LAB — URINALYSIS
Bilirubin Urine: NEGATIVE
Hgb urine dipstick: NEGATIVE
Ketones, ur: NEGATIVE
Leukocytes,Ua: NEGATIVE
Nitrite: NEGATIVE
Specific Gravity, Urine: 1.025 (ref 1.000–1.030)
Total Protein, Urine: NEGATIVE
Urine Glucose: NEGATIVE
Urobilinogen, UA: 0.2 (ref 0.0–1.0)
pH: 5.5 (ref 5.0–8.0)

## 2020-08-26 LAB — COMPREHENSIVE METABOLIC PANEL
ALT: 10 U/L (ref 0–35)
AST: 13 U/L (ref 0–37)
Albumin: 4.2 g/dL (ref 3.5–5.2)
Alkaline Phosphatase: 54 U/L (ref 39–117)
BUN: 17 mg/dL (ref 6–23)
CO2: 31 mEq/L (ref 19–32)
Calcium: 9.4 mg/dL (ref 8.4–10.5)
Chloride: 103 mEq/L (ref 96–112)
Creatinine, Ser: 1.08 mg/dL (ref 0.40–1.20)
GFR: 53.61 mL/min — ABNORMAL LOW (ref >60.00)
Glucose, Bld: 101 mg/dL — ABNORMAL HIGH (ref 70–99)
Potassium: 4.1 mEq/L (ref 3.5–5.1)
Sodium: 140 mEq/L (ref 135–145)
Total Bilirubin: 0.6 mg/dL (ref 0.2–1.2)
Total Protein: 6.9 g/dL (ref 6.0–8.3)

## 2020-08-26 LAB — HEMOGLOBIN A1C: Hgb A1c MFr Bld: 5.9 % (ref 4.6–6.5)

## 2020-08-26 LAB — LIPID PANEL
Cholesterol: 179 mg/dL (ref 0–200)
HDL: 40.8 mg/dL (ref >39.00)
LDL Cholesterol: 121 mg/dL — ABNORMAL HIGH (ref 0–99)
NonHDL: 137.84
Total CHOL/HDL Ratio: 4
Triglycerides: 82 mg/dL (ref 0.0–149.0)
VLDL: 16.4 mg/dL (ref 0.0–40.0)

## 2020-08-26 LAB — VITAMIN D 25 HYDROXY (VIT D DEFICIENCY, FRACTURES): VITD: 52.92 ng/mL (ref 30.00–100.00)

## 2020-08-26 LAB — TSH: TSH: 2.74 u[IU]/mL (ref 0.35–4.50)

## 2020-08-27 ENCOUNTER — Other Ambulatory Visit: Payer: Self-pay | Admitting: Internal Medicine

## 2020-08-27 DIAGNOSIS — F3289 Other specified depressive episodes: Secondary | ICD-10-CM

## 2020-08-27 DIAGNOSIS — R7303 Prediabetes: Secondary | ICD-10-CM

## 2020-08-28 ENCOUNTER — Other Ambulatory Visit: Payer: Self-pay | Admitting: Internal Medicine

## 2020-08-28 DIAGNOSIS — R944 Abnormal results of kidney function studies: Secondary | ICD-10-CM

## 2020-10-13 ENCOUNTER — Ambulatory Visit: Payer: PRIVATE HEALTH INSURANCE | Admitting: Family Medicine

## 2020-10-13 VITALS — BP 148/82 | HR 59

## 2020-10-13 DIAGNOSIS — H5789 Other specified disorders of eye and adnexa: Secondary | ICD-10-CM

## 2020-10-13 NOTE — Progress Notes (Signed)
  Subjective:     Patient ID: Jennifer Quinn, female   DOB: 08-23-1955, 65 y.o.   MRN: [id number]  HPI Phil [facility] today c/o left eye irritation that started this morning. She states she wears contacts, sleeps in them at night, and she took her contact out this morning and noticed a hair on it, and since the left eye has felt irritated. She denies any pain or blurred vision. She has left her contact out and has glasses.   Review of Systems  Constitutional: Negative.   HENT: Negative.   Eyes: Positive for redness. Negative for photophobia, pain, discharge, itching and visual disturbance.       See HPI   Respiratory: Negative.   Cardiovascular: Negative.   Gastrointestinal: Negative.        Objective:   Physical Exam Vitals reviewed.  Constitutional:      General: She is not in acute distress.    Appearance: Normal appearance. She is well-developed.  HENT:     Head: Normocephalic and atraumatic.  Eyes:     General: Lids are normal.        Right eye: No foreign body or discharge.        Left eye: No foreign body or discharge.     Extraocular Movements: Extraocular movements intact.     Conjunctiva/sclera:     Right eye: Right conjunctiva is not injected. No exudate.    Left eye: Left conjunctiva is injected. No exudate.    Pupils: Pupils are equal, round, and reactive to light.  Pulmonary:     Effort: Pulmonary effort is normal. No respiratory distress.  Skin:    General: Skin is warm and dry.  Neurological:     Mental Status: She is alert and oriented to person, place, and time.  Psychiatric:        Mood and Affect: Mood normal.        Behavior: Behavior normal.    Today's Vitals   10/13/20 0835  BP: (!) 148/82  Pulse: (!) 59  SpO2: 97%   There is no height or weight on file to calculate BMI.     Assessment:     Irritation of left eye      Plan:     1. Pt reports she has an eye doctor appointment tomorrow. Advised to keep  this appointment. Unable to do slit lamp eval here. Continue leaving contacts out and using glasses until sees them.  Continue to monitor, should she develop eye pain, discharge, or vision changes she should be seen sooner at urgent care or PCP for further evaluation.  2. BP elevated today at visit. Pt will monitor. She knows if it remains greater than 140/90 she will need to see her PCP for reevaluation of treatment of her HTN.

## 2020-12-31 NOTE — Telephone Encounter (Signed)
Patient notified of negative EMB results.

## 2021-02-23 ENCOUNTER — Emergency Department (HOSPITAL_BASED_OUTPATIENT_CLINIC_OR_DEPARTMENT_OTHER): Payer: PRIVATE HEALTH INSURANCE

## 2021-02-23 ENCOUNTER — Emergency Department (HOSPITAL_BASED_OUTPATIENT_CLINIC_OR_DEPARTMENT_OTHER)
Admission: EM | Admit: 2021-02-23 | Discharge: 2021-02-23 | Disposition: A | Discharge status: Home / Self Care | Payer: PRIVATE HEALTH INSURANCE | Attending: Emergency Medicine | Admitting: Emergency Medicine

## 2021-02-23 ENCOUNTER — Encounter (HOSPITAL_BASED_OUTPATIENT_CLINIC_OR_DEPARTMENT_OTHER): Payer: Self-pay | Admitting: Emergency Medicine

## 2021-02-23 ENCOUNTER — Other Ambulatory Visit: Payer: Self-pay

## 2021-02-23 DIAGNOSIS — R609 Edema, unspecified: Secondary | ICD-10-CM | POA: Diagnosis not present

## 2021-02-23 DIAGNOSIS — Z87891 Personal history of nicotine dependence: Secondary | ICD-10-CM | POA: Insufficient documentation

## 2021-02-23 DIAGNOSIS — J45909 Unspecified asthma, uncomplicated: Secondary | ICD-10-CM | POA: Insufficient documentation

## 2021-02-23 DIAGNOSIS — M79661 Pain in right lower leg: Secondary | ICD-10-CM | POA: Diagnosis present

## 2021-02-23 DIAGNOSIS — I10 Essential (primary) hypertension: Secondary | ICD-10-CM | POA: Diagnosis not present

## 2021-02-23 DIAGNOSIS — M79662 Pain in left lower leg: Secondary | ICD-10-CM

## 2021-02-23 DIAGNOSIS — Z79899 Other long term (current) drug therapy: Secondary | ICD-10-CM | POA: Insufficient documentation

## 2021-02-23 DIAGNOSIS — Z7984 Long term (current) use of oral hypoglycemic drugs: Secondary | ICD-10-CM | POA: Diagnosis not present

## 2021-02-23 DIAGNOSIS — E119 Type 2 diabetes mellitus without complications: Secondary | ICD-10-CM | POA: Insufficient documentation

## 2021-02-23 NOTE — ED Triage Notes (Signed)
Pt arrives to ED with c/o of left leg pain x3 days. Pt has reported new onset of ankle swelling in the leg. Leg feels "heavy". Denies SOB, CP, DOE.

## 2021-02-23 NOTE — ED Provider Notes (Signed)
[place] EMERGENCY DEPT Provider Note   CSN: [id number] Arrival date & time: 02/23/21  1747     History Chief Complaint  Patient presents with   Leg Pain    Jennifer Quinn is a 65 y.o. female.  65 year old female with past medical history below who presents with left lower leg pain.  For the past 3 days, patient has had intermittent pain in her left lower leg that initially started in her calf and then at 1 point was on the shin area.  Pain has waxed and waned in intensity, no specific aggravating or alleviating factors.  She did rub some Voltaren gel on her leg at 1 point that did seem to help.  Her leg sometimes feels heavy compared to the right but she denies any numbness or weakness.  No problems walking.  She denies any recent travel, immobilization, history of blood clots, estrogen use, or history of cancer.  No chest pain or shortness of breath.  No change in physical activity recently.  She does note that she has a history of left knee problems and previously had injections in her knee.  Denies any current hip or knee pain.  The history is provided by the patient.  Leg Pain     Past Medical History:  Diagnosis Date   Asthma    Chest pain    Chronic constipation    Depression    Fibroid    GERD (gastroesophageal reflux disease)    occ   History of anemia    Hypertension    PMB (postmenopausal bleeding)    Vitamin D deficiency    Wears contact lenses    Wears partial dentures    upper and lower    Patient Active Problem List   Diagnosis Date Noted   Acute sinusitis 06/03/2019   Diabetes ([place]) 06/03/2019   Knee pain, left 05/09/2018   Paresthesia 09/13/2016   Nipple lesion 09/23/2015   GERD (gastroesophageal reflux disease) 05/22/2015   Chest pain, atypical 05/22/2015   Well adult exam 12/12/2011   Anxiety 12/12/2011   Constipation 12/12/2011   ELEVATED BLOOD PRESSURE 09/03/2010   COLONIC POLYPS, HX OF 09/03/2010   ALLERGIC RHINITIS  12/15/2009   VERTIGO 04/25/2008   Vitamin D deficiency 08/28/2007   OBESITY, MORBID 08/28/2007   Edema 08/28/2007   WEIGHT GAIN 08/28/2007   DEPRESSION 06/09/2007    Past Surgical History:  Procedure Laterality Date   COLONOSCOPY  2007, 01/25/2011. 05/25/16   DILATATION & CURETTAGE/HYSTEROSCOPY WITH MYOSURE N/A 07/23/2018   Procedure: DILATATION & CURETTAGE/HYSTEROSCOPY WITH MYOSURE;  Surgeon: Salvadore Dom, MD;  Location: [place];  Service: Gynecology;  Laterality: N/A;  Follow 1st case. Use [facility] in [facility] of [facility] block time.     OB History     Gravida  4   Para  3   Term  3   Preterm      AB  1   Living  3      SAB  1   IAB      Ectopic      Multiple      Live Births              Family History  Problem Relation Age of Onset   Colon cancer Mother    Diabetes Mother    Obesity Mother    Breast cancer Sister    Stroke Father    Alcoholism Father    Thyroid disease Daughter  Multiple sclerosis Daughter     Social History   Tobacco Use   Smoking status: Former    Years: 10.00    Pack years: 0.00    Types: Cigarettes    Quit date: 07/18/1985    Years since quitting: 35.6   Smokeless tobacco: Never  Vaping Use   Vaping Use: Never used  Substance Use Topics   Alcohol use: Yes    Alcohol/week: 1.0 standard drink    Types: 1 Standard drinks or equivalent per week    Comment: seldom   Drug use: Never    Home Medications Prior to Admission medications   Medication Sig Start Date End Date Taking? Authorizing Provider  buPROPion (WELLBUTRIN) 100 MG tablet Take 1 tablet (100 mg total) by mouth daily. 08/27/20   Plotnikov, Evie Lacks, MD  Cholecalciferol (VITAMIN D PO) Take 1,000 Units by mouth daily.     [provider]  Fish Oil OIL Take 2,000 mg by mouth daily.    [provider]  hydrochlorothiazide (MICROZIDE) 12.5 MG capsule Take 1 capsule (12.5 mg total) by mouth 2 (two) times daily. 06/03/19    Plotnikov, Evie Lacks, MD  ibuprofen (ADVIL) 800 MG tablet Take 800 mg by mouth 3 (three) times daily. Patient not taking: Reported on 10/13/2020 04/05/20   [provider]  metFORMIN (GLUCOPHAGE) 500 MG tablet Take 1 tablet (500 mg total) by mouth daily with breakfast. 08/27/20   Plotnikov, Evie Lacks, MD  potassium chloride SA (KLOR-CON M20) 20 MEQ tablet Take 1 tablet (20 mEq total) by mouth daily. 08/27/20   Plotnikov, Evie Lacks, MD    Allergies    Patient has no known allergies.  Review of Systems   Review of Systems  Physical Exam Updated Vital Signs BP (!) 164/60 (BP Location: Right Arm)   Pulse (!) 59   Temp 98.2 F (36.8 C)   Resp 18   Ht 5\' 2"  (1.575 m)   Wt 106.6 kg   SpO2 100%   BMI 42.98 kg/m   Physical Exam Vitals and nursing note reviewed.  Constitutional:      General: She is not in acute distress.    Appearance: She is well-developed.  HENT:     Head: Normocephalic and atraumatic.  Eyes:     Conjunctiva/sclera: Conjunctivae normal.  Cardiovascular:     Pulses: Normal pulses.  Musculoskeletal:        General: No tenderness.     Cervical back: Neck supple.     Comments: LLE: Mild edema BLE; no focal calf or leg tenderness, normal ROM knee and hip without pain  Skin:    General: Skin is warm and dry.     Findings: No erythema or lesion.  Neurological:     Mental Status: She is alert and oriented to person, place, and time.  Psychiatric:        Judgment: Judgment normal.    ED Results / Procedures / Treatments   Labs (all labs ordered are listed, but only abnormal results are displayed) Labs Reviewed - No data to display  EKG None  Radiology US Venous Img Lower Unilateral Left  Result Date: 02/23/2021 CLINICAL DATA:  Left lower extremity pain and edema EXAM: Left LOWER EXTREMITY VENOUS DOPPLER ULTRASOUND TECHNIQUE: Gray-scale sonography with compression, as well as color and duplex ultrasound, were performed to evaluate the deep venous  system(s) from the level of the common femoral vein through the popliteal and proximal calf veins. COMPARISON:  None. FINDINGS: VENOUS Normal  compressibility of the common femoral, superficial femoral, and popliteal veins, as well as the visualized calf veins. Visualized portions of profunda femoral vein and great saphenous vein unremarkable. No filling defects to suggest DVT on grayscale or color Doppler imaging. Doppler waveforms show normal direction of venous flow, normal respiratory plasticity and response to augmentation. Limited views of the contralateral common femoral vein are unremarkable. OTHER None. Limitations: none IMPRESSION: Negative. Electronically Signed   By: Donavan Foil M.D.   On: 02/23/2021 19:10    Procedures Procedures   Medications Ordered in ED Medications - No data to display  ED Course  I have reviewed the triage vital signs and the nursing notes.  Pertinent imaging results that were available during my care of the patient were reviewed by me and considered in my medical decision making (see chart for details).    MDM Rules/Calculators/A&P                          Neurovascularly intact distally, no focal joint swelling or pain, full range of motion, no skin changes to suggest cellulitis or other infectious process.  Good distal pulses.  DVT ultrasound is negative.  Have recommended PCP follow-up in 1 week if persistent symptoms for repeat ultrasound and further evaluation.  Discussed abortive measures.  Reviewed return precautions and she voiced understanding. Final Clinical Impression(s) / ED Diagnoses Final diagnoses:  Pain of left calf    Rx / DC Orders ED Discharge Orders     None        Anabelen Kaminsky, Wenda Overland, MD 02/23/21 2127

## 2021-04-20 ENCOUNTER — Ambulatory Visit: Payer: PRIVATE HEALTH INSURANCE | Admitting: Family Medicine

## 2021-04-20 ENCOUNTER — Other Ambulatory Visit: Payer: Self-pay | Admitting: Family Medicine

## 2021-04-20 VITALS — BP 140/80 | HR 59 | Ht 62.0 in | Wt 235.0 lb

## 2021-04-20 DIAGNOSIS — Z789 Other specified health status: Secondary | ICD-10-CM

## 2021-04-20 NOTE — Progress Notes (Signed)
Subjective:     Patient ID: Jennifer Quinn, female   DOB: 1955/09/20, 65 y.o.   MRN: [id number]  HPI Jennifer [facility] for her required wellness visit for her insurance. She has a PCP, Dr. Alain Marion, who she last saw for her annual physical in January of this year. She is due for her colonoscopy later this year and needs to schedule her mammogram. She states pap smear was done this year and was normal. She denies any problems or concerns at this visit. She states she is going to work on diet and exercise. GFR was decreased in labs done in January and needs repeat, will draw this today.   Past Medical History:  Diagnosis Date   Asthma    Chest pain    Chronic constipation    Depression    Fibroid    GERD (gastroesophageal reflux disease)    occ   History of anemia    Hypertension    PMB (postmenopausal bleeding)    Vitamin D deficiency    Wears contact lenses    Wears partial dentures    upper and lower   No Known Allergies  Current Outpatient Medications:    buPROPion (WELLBUTRIN) 100 MG tablet, Take 1 tablet (100 mg total) by mouth daily., Disp: 90 tablet, Rfl: 3   Cholecalciferol (VITAMIN D PO), Take 1,000 Units by mouth daily. , Disp: , Rfl:    Fish Oil OIL, Take 2,000 mg by mouth daily., Disp: , Rfl:    hydrochlorothiazide (MICROZIDE) 12.5 MG capsule, Take 1 capsule (12.5 mg total) by mouth 2 (two) times daily., Disp: 180 capsule, Rfl: 3   meloxicam (MOBIC) 15 MG tablet, Take 15 mg by mouth daily as needed., Disp: , Rfl:    metFORMIN (GLUCOPHAGE) 500 MG tablet, Take 1 tablet (500 mg total) by mouth daily with breakfast., Disp: 90 tablet, Rfl: 3   potassium chloride SA (KLOR-CON M20) 20 MEQ tablet, Take 1 tablet (20 mEq total) by mouth daily., Disp: 90 tablet, Rfl: 3   ibuprofen (ADVIL) 800 MG tablet, Take 800 mg by mouth 3 (three) times daily. (Patient not taking: No sig reported), Disp: , Rfl:    Review of Systems   Constitutional:  Negative for chills, fatigue, fever and unexpected weight change.  HENT:  Negative for congestion, ear pain, sinus pressure, sinus pain and sore throat.   Eyes:  Negative for discharge and visual disturbance.  Respiratory:  Negative for cough, shortness of breath and wheezing.   Cardiovascular:  Negative for chest pain and leg swelling.  Gastrointestinal:  Negative for abdominal pain, blood in stool, constipation, diarrhea, nausea and vomiting.  Genitourinary:  Negative for difficulty urinating and hematuria.  Skin:  Negative for color change.  Neurological:  Negative for dizziness, weakness, light-headedness and headaches.  Hematological:  Negative for adenopathy.  All other systems reviewed and are negative.     Objective:   Physical Exam Vitals reviewed.  Constitutional:      General: She is not in acute distress.    Appearance: Normal appearance. She is well-developed.  HENT:     Head: Normocephalic and atraumatic.  Eyes:     General:        Right eye: No discharge.        Left eye: No discharge.  Cardiovascular:     Rate and Rhythm: Normal rate and regular rhythm.     Heart sounds: Normal heart sounds.  Pulmonary:  Effort: Pulmonary effort is normal. No respiratory distress.     Breath sounds: Normal breath sounds.  Musculoskeletal:     Cervical back: Neck supple.  Skin:    General: Skin is warm and dry.  Neurological:     Mental Status: She is alert and oriented to person, place, and time.  Psychiatric:        Mood and Affect: Mood normal.        Behavior: Behavior normal.   Today's Vitals   04/20/21 0832  BP: 140/80  Pulse: (!) 59  SpO2: 97%  Weight: 235 lb (106.6 kg)  Height: '5\' 2"'$  (1.575 m)   Body mass index is 42.98 kg/m.     Assessment:     Participant in health and wellness plan     Plan:     Encouraged efforts at weight loss with healthy eating and increased physical activity. Keep all regular appts with PCP. Will draw CMP  today to recheck GFR and send results to her PCP. F/u here prn.

## 2021-04-21 LAB — COMPLETE METABOLIC PANEL WITH GFR
AG Ratio: 1.6 (calc) (ref 1.0–2.5)
ALT: 11 U/L (ref 6–29)
AST: 8 U/L — ABNORMAL LOW (ref 10–35)
Albumin: 4.1 g/dL (ref 3.6–5.1)
Alkaline phosphatase (APISO): 56 U/L (ref 37–153)
BUN: 20 mg/dL (ref 7–25)
CO2: 25 mmol/L (ref 20–32)
Calcium: 9.4 mg/dL (ref 8.6–10.4)
Chloride: 104 mmol/L (ref 98–110)
Creat: 0.83 mg/dL (ref 0.50–1.05)
Globulin: 2.5 g/dL (calc) (ref 1.9–3.7)
Glucose, Bld: 79 mg/dL (ref 65–99)
Potassium: 4.4 mmol/L (ref 3.5–5.3)
Sodium: 139 mmol/L (ref 135–146)
Total Bilirubin: 0.5 mg/dL (ref 0.2–1.2)
Total Protein: 6.6 g/dL (ref 6.1–8.1)
eGFR: 78 mL/min/{1.73_m2} (ref >=60)

## 2021-05-26 ENCOUNTER — Other Ambulatory Visit: Payer: Self-pay | Admitting: Internal Medicine

## 2021-05-26 DIAGNOSIS — Z1231 Encounter for screening mammogram for malignant neoplasm of breast: Secondary | ICD-10-CM

## 2021-06-16 ENCOUNTER — Ambulatory Visit: Payer: PRIVATE HEALTH INSURANCE | Admitting: Internal Medicine

## 2021-06-16 ENCOUNTER — Other Ambulatory Visit: Payer: Self-pay

## 2021-06-16 ENCOUNTER — Encounter: Payer: Self-pay | Admitting: Internal Medicine

## 2021-06-16 VITALS — BP 122/80 | HR 82 | Temp 98.0°F | Ht 62.0 in | Wt 244.0 lb

## 2021-06-16 DIAGNOSIS — E785 Hyperlipidemia, unspecified: Secondary | ICD-10-CM

## 2021-06-16 DIAGNOSIS — R635 Abnormal weight gain: Secondary | ICD-10-CM

## 2021-06-16 DIAGNOSIS — R202 Paresthesia of skin: Secondary | ICD-10-CM | POA: Diagnosis not present

## 2021-06-16 DIAGNOSIS — R29898 Other symptoms and signs involving the musculoskeletal system: Secondary | ICD-10-CM | POA: Diagnosis not present

## 2021-06-16 LAB — CBC WITH DIFFERENTIAL/PLATELET
Basophils Absolute: 0 10*3/uL (ref 0.0–0.1)
Basophils Relative: 0.5 % (ref 0.0–3.0)
Eosinophils Absolute: 0.3 10*3/uL (ref 0.0–0.7)
Eosinophils Relative: 7.3 % — ABNORMAL HIGH (ref 0.0–5.0)
HCT: 39.4 % (ref 36.0–46.0)
Hemoglobin: 13.1 g/dL (ref 12.0–15.0)
Lymphocytes Relative: 32.5 % (ref 12.0–46.0)
Lymphs Abs: 1.5 10*3/uL (ref 0.7–4.0)
MCHC: 33.3 g/dL (ref 30.0–36.0)
MCV: 83.1 fl (ref 78.0–100.0)
Monocytes Absolute: 0.3 10*3/uL (ref 0.1–1.0)
Monocytes Relative: 7.4 % (ref 3.0–12.0)
Neutro Abs: 2.4 10*3/uL (ref 1.4–7.7)
Neutrophils Relative %: 52.3 % (ref 43.0–77.0)
Platelets: 197 10*3/uL (ref 150.0–400.0)
RBC: 4.74 Mil/uL (ref 3.87–5.11)
RDW: 14.9 % (ref 11.5–15.5)
WBC: 4.6 10*3/uL (ref 4.0–10.5)

## 2021-06-16 LAB — COMPREHENSIVE METABOLIC PANEL
ALT: 11 U/L (ref 0–35)
AST: 15 U/L (ref 0–37)
Albumin: 4.4 g/dL (ref 3.5–5.2)
Alkaline Phosphatase: 54 U/L (ref 39–117)
BUN: 19 mg/dL (ref 6–23)
CO2: 29 mEq/L (ref 19–32)
Calcium: 9.7 mg/dL (ref 8.4–10.5)
Chloride: 101 mEq/L (ref 96–112)
Creatinine, Ser: 0.93 mg/dL (ref 0.40–1.20)
GFR: 63.78 mL/min (ref >60.00)
Glucose, Bld: 107 mg/dL — ABNORMAL HIGH (ref 70–99)
Potassium: 3.9 mEq/L (ref 3.5–5.1)
Sodium: 139 mEq/L (ref 135–145)
Total Bilirubin: 0.5 mg/dL (ref 0.2–1.2)
Total Protein: 7.3 g/dL (ref 6.0–8.3)

## 2021-06-16 LAB — VITAMIN B12: Vitamin B-12: 605 pg/mL (ref 211–911)

## 2021-06-16 LAB — TSH: TSH: 4.68 u[IU]/mL (ref 0.35–5.50)

## 2021-06-16 LAB — T4, FREE: Free T4: 0.85 ng/dL (ref 0.60–1.60)

## 2021-06-16 MED ORDER — METHYLPREDNISOLONE 4 MG PO TBPK: ORAL_TABLET | ORAL | 0 refills | Status: DC

## 2021-06-16 NOTE — Assessment & Plan Note (Signed)
B leg weakness when standing in the buttocks and standing x since August. R/o spinal stenosis and other causes Check TSH, B12, LS spine X ray On Meloxicam - better

## 2021-06-16 NOTE — Progress Notes (Signed)
Subjective:  Patient ID: Jennifer Quinn, female    DOB: 1956-01-18  Age: 65 y.o. MRN: [id number]  CC: No chief complaint on file.   HPI Jennifer Quinn presents for B leg weakness when standing in the buttocks and standing x since August. Meloxicam helps. Better w/leaning forward. Tingling in the LEs. No falls. C/o wt gain  Outpatient Medications Prior to Visit  Medication Sig Dispense Refill   buPROPion (WELLBUTRIN) 100 MG tablet Take 1 tablet (100 mg total) by mouth daily. 90 tablet 3   Cholecalciferol (VITAMIN D PO) Take 1,000 Units by mouth daily.      Fish Oil OIL Take 2,000 mg by mouth daily.     hydrochlorothiazide (MICROZIDE) 12.5 MG capsule Take 1 capsule (12.5 mg total) by mouth 2 (two) times daily. 180 capsule 3   meloxicam (MOBIC) 15 MG tablet Take 15 mg by mouth daily as needed.     metFORMIN (GLUCOPHAGE) 500 MG tablet Take 1 tablet (500 mg total) by mouth daily with breakfast. 90 tablet 3   potassium chloride SA (KLOR-CON M20) 20 MEQ tablet Take 1 tablet (20 mEq total) by mouth daily. 90 tablet 3   ibuprofen (ADVIL) 800 MG tablet Take 800 mg by mouth 3 (three) times daily. (Patient not taking: No sig reported)     No facility-administered medications prior to visit.    ROS: Review of Systems  Constitutional:  Positive for unexpected weight change. Negative for activity change, appetite change, chills and fatigue.  HENT:  Negative for congestion, mouth sores and sinus pressure.   Eyes:  Negative for visual disturbance.  Respiratory:  Negative for cough and chest tightness.   Gastrointestinal:  Negative for abdominal pain and nausea.  Genitourinary:  Negative for difficulty urinating, frequency and vaginal pain.  Musculoskeletal:  Positive for back pain. Negative for gait problem.  Skin:  Negative for pallor and rash.  Neurological:  Positive for weakness. Negative for dizziness, tremors, numbness and headaches.  Psychiatric/Behavioral:  Negative for confusion and  sleep disturbance.    Objective:  BP 122/80 (BP Location: Left Arm, Patient Position: Sitting, Cuff Size: Normal)   Pulse 82   Temp 98 F (36.7 C) (Oral)   Ht 5\' 2"  (1.575 m)   Wt 244 lb (110.7 kg)   SpO2 97%   BMI 44.63 kg/m   BP Readings from Last 3 Encounters:  06/16/21 122/80  04/20/21 140/80  02/23/21 (!) 164/60    Wt Readings from Last 3 Encounters:  06/16/21 244 lb (110.7 kg)  04/20/21 235 lb (106.6 kg)  02/23/21 235 lb (106.6 kg)    Physical Exam Constitutional:      General: She is not in acute distress.    Appearance: She is well-developed. She is obese.  HENT:     Head: Normocephalic.     Right Ear: External ear normal.     Left Ear: External ear normal.     Nose: Nose normal.  Eyes:     General:        Right eye: No discharge.        Left eye: No discharge.     Conjunctiva/sclera: Conjunctivae normal.     Pupils: Pupils are equal, round, and reactive to light.  Neck:     Thyroid: No thyromegaly.     Vascular: No JVD.     Trachea: No tracheal deviation.  Cardiovascular:     Rate and Rhythm: Normal rate and regular rhythm.     Heart sounds:  Normal heart sounds.  Pulmonary:     Effort: No respiratory distress.     Breath sounds: No stridor. No wheezing.  Abdominal:     General: Bowel sounds are normal. There is no distension.     Palpations: Abdomen is soft. There is no mass.     Tenderness: There is no abdominal tenderness. There is no guarding or rebound.  Musculoskeletal:        General: No tenderness.     Cervical back: Normal range of motion and neck supple. No rigidity.  Lymphadenopathy:     Cervical: No cervical adenopathy.  Skin:    Findings: No erythema or rash.  Neurological:     Cranial Nerves: No cranial nerve deficit.     Motor: No abnormal muscle tone.     Coordination: Coordination normal.     Deep Tendon Reflexes: Reflexes normal.  Psychiatric:        Behavior: Behavior normal.        Thought Content: Thought content normal.         Judgment: Judgment normal.  LS NT MS ok DTRs decreased a little   Lab Results  Component Value Date   WBC 4.6 08/26/2020   HGB 13.2 08/26/2020   HCT 40.2 08/26/2020   PLT 201.0 08/26/2020   GLUCOSE 79 04/20/2021   CHOL 179 08/26/2020   TRIG 82.0 08/26/2020   HDL 40.80 08/26/2020   LDLDIRECT 157.0 05/09/2018   LDLCALC 121 (H) 08/26/2020   ALT 11 04/20/2021   AST 8 (L) 04/20/2021   NA 139 04/20/2021   K 4.4 04/20/2021   CL 104 04/20/2021   CREATININE 0.83 04/20/2021   BUN 20 04/20/2021   CO2 25 04/20/2021   TSH 2.74 08/26/2020   HGBA1C 5.9 08/26/2020    US Venous Img Lower Unilateral Left  Result Date: 02/23/2021 CLINICAL DATA:  Left lower extremity pain and edema EXAM: Left LOWER EXTREMITY VENOUS DOPPLER ULTRASOUND TECHNIQUE: Gray-scale sonography with compression, as well as color and duplex ultrasound, were performed to evaluate the deep venous system(s) from the level of the common femoral vein through the popliteal and proximal calf veins. COMPARISON:  None. FINDINGS: VENOUS Normal compressibility of the common femoral, superficial femoral, and popliteal veins, as well as the visualized calf veins. Visualized portions of profunda femoral vein and great saphenous vein unremarkable. No filling defects to suggest DVT on grayscale or color Doppler imaging. Doppler waveforms show normal direction of venous flow, normal respiratory plasticity and response to augmentation. Limited views of the contralateral common femoral vein are unremarkable. OTHER None. Limitations: none IMPRESSION: Negative. Electronically Signed   By: Donavan Foil M.D.   On: 02/23/2021 19:10    Assessment & Plan:   Problem List Items Addressed This Visit     Leg weakness, bilateral    B leg weakness when standing in the buttocks and standing x since August. R/o spinal stenosis and other causes Check TSH, B12, LS spine X ray On Meloxicam - better      Relevant Orders   DG Lumbar Spine 2-3 Views    CBC with Differential/Platelet   Comprehensive metabolic panel   TSH   T4, free   Vitamin B12   Paresthesias    B leg weakness when standing in the buttocks and standing x since August. R/o spinal stenosis and other causes Check TSH, B12, LS spine X ray On Meloxicam - better      Relevant Orders   DG Lumbar Spine 2-3 Views  CBC with Differential/Platelet   Comprehensive metabolic panel   TSH   T4, free   Vitamin B12   Weight gain    Check TSH Diet discussed      Relevant Orders   CBC with Differential/Platelet   Comprehensive metabolic panel   TSH   T4, free   Vitamin B12   Other Visit Diagnoses     Dyslipidemia    -  Primary   Relevant Orders   CT CARDIAC SCORING (SELF PAY ONLY)         Meds ordered this encounter  Medications   methylPREDNISolone (MEDROL DOSEPAK) 4 MG TBPK tablet    Sig: As directed    Dispense:  21 tablet    Refill:  0      Follow-up: Return in about 6 weeks (around 07/28/2021) for a follow-up visit.  Walker Kehr, MD

## 2021-06-16 NOTE — Addendum Note (Signed)
Addended by: Boris Lown B on: 06/16/2021 08:43 AM   Modules accepted: Orders

## 2021-06-16 NOTE — Patient Instructions (Addendum)
The Obesity Code book by Sharman Cheek   These suggestions will probably help you to improve your metabolism if you are not overweight and to lose weight if you are overweight: 1.  Reduce your consumption of sugars and starches.  Eliminate high fructose corn syrup from your diet.  Reduce your consumption of processed foods.  For desserts try to have seasonal fruits, berries, nuts, cheeses or dark chocolate with more than 70% cacao. 2.  Do not snack 3.  You do not have to eat breakfast.  If you choose to have breakfast - eat plain greek yogurt, eggs, oatmeal (without sugar) - use honey if you need to. 4.  Drink water, freshly brewed unsweetened tea (green, black or herbal) or coffee.  Do not drink sodas including diet sodas , juices, beverages sweetened with artificial sweeteners. 5.  Reduce your consumption of refined grains. 6.  Avoid protein drinks such as Optifast, Slim fast etc. Eat chicken, fish, meat, dairy and beans for your sources of protein. 7.  Natural unprocessed fats like cold pressed virgin olive oil, butter, coconut oil are good for you.  Eat avocados. 8.  Increase your consumption of fiber.  Fruits, berries, vegetables, whole grains, flaxseed, chia seeds, beans, popcorn, nuts, oatmeal are good sources of fiber 9.  Use vinegar in your diet, i.e. apple cider vinegar, red wine or balsamic vinegar 10.  You can try fasting.  For example you can skip breakfast and lunch every other day (24-hour fast) 11.  Stress reduction, good night sleep, relaxation, meditation, yoga and other physical activity is likely to help you to maintain low weight too. 12.  If you drink alcohol, limit your alcohol intake to no more than 2 drinks a day. Spinal Stenosis Spinal stenosis is a condition that happens when the spinal canal narrows. The spinal canal is the space between the bones of your spine (vertebrae). This narrowing puts pressure on the spinal cord or nerves. Spinal stenosis can affect the vertebrae in  the neck, upper back, and lower back. This condition can range from mild to severe. In some cases, there are no symptoms. What are the causes? This condition is caused by areas of bone pushing into the spinal canal. This condition may be present at birth (congenital), or it may be caused by: Slow breakdown of your vertebrae (spinal degeneration). This usually starts around 67 years of age. Injury (trauma) to your spine. Tumors in your spine. Calcium deposits in your spine. What increases the risk? The following factors may make you more likely to develop this condition: Being older than age 53. Having a problem present at birth with an abnormally shaped spine (congenitalspinal deformity), such as scoliosis. Having arthritis. What are the signs or symptoms? Symptoms of this condition include: Pain in the neck or back that is generally worse with activities, particularly when you stand or walk. Numbness, tingling, hot or cold sensations, weakness, or tiredness (fatigue) in your leg or legs. Pain going from the buttock, down the thigh, and to the calf (sciatica). This can happen in one or both legs. Frequent episodes of falling. A foot-slapping gait that leads to muscle weakness. In more severe cases, you may develop: Problems having a bowel movement or urinating. Difficulty having sex. Loss of feeling in your legs and inability to walk. Symptoms may come on slowly and get worse over time. In some cases, there are no symptoms. How is this diagnosed? This condition is diagnosed based on your medical history and a physical  exam. You will also have tests, such as an MRI, a CT scan, or an X-ray. How is this treated? Treatment for this condition often focuses on managing your pain and any other symptoms. Treatment may include: Practicing good posture to lessen pressure on your nerves. Exercising to strengthen muscles, build endurance, improve balance, and maintain range of motion. This may  include physical therapy to restore movement and strength to your back. Losing weight, if needed. Medicines to reduce inflammation or pain. This may include a medicine that is injected into your spine (steroidinjection). Assistive devices, such as a corset or brace. In some cases, surgery may be needed. The most common procedure is decompression laminectomy. This is done to remove excess bone that puts pressure on your nerve roots. Follow these instructions at home: Managing pain, stiffness, and swelling  Practice good posture. If you were given a brace or a corset, wear it as told by your health care provider. Maintain a healthy weight. Talk with your health care provider if you need help losing weight. If directed, apply heat to the affected area as often as told by your health care provider. Use the heat source that your health care provider recommends, such as a moist heat pack or a heating pad. Place a towel between your skin and the heat source. Leave the heat on for 20-30 minutes. Remove the heat if your skin turns bright red. This is especially important if you are unable to feel pain, heat, or cold. You may have a greater risk of getting burned. Activity Do all exercises and stretches as told by your health care provider. Do not do any activities that cause pain. Ask your health care provider what activities are safe for you. Do not lift anything that is heavier than 10 lb (4.5 kg), or the limit that you are told by your health care provider. Return to your normal activities as told by your health care provider. Ask your health care provider what activities are safe for you. General instructions Take over-the-counter and prescription medicines only as told by your health care provider. Do not use any products that contain nicotine or tobacco, such as cigarettes, e-cigarettes, and chewing tobacco. If you need help quitting, ask your health care provider. Eat a healthy diet. This  includes plenty of fruits and vegetables, whole grains, and low-fat (lean) protein. Keep all follow-up visits as told by your health care provider. This is important. Contact a health care provider if: Your symptoms do not get better or they get worse. You have a fever. Get help right away if: You have new pain or symptoms of severe pain, such as: New or worsening pain in your neck or upper back. Severe pain that cannot be controlled with medicines. A severe headache that gets worse when you stand. You are dizzy. You have vision problems, such as blurred vision or double vision. You have nausea or you vomit. You develop new or worsening numbness or tingling in your back or legs. You have pain, redness, swelling, or warmth in your arm or leg. Summary Spinal stenosis is a condition that happens when the spinal canal narrows. The spinal canal is the space between the bones of your spine (vertebrae). This narrowing puts pressure on the spinal cord or nerves. This condition may be caused by a birth defect, breakdown of your vertebrae, trauma, tumors, or calcium deposits. Spinal stenosis can cause numbness, weakness, or pain in the buttocks, neck, back, and legs. This condition is usually diagnosed  with your medical history, a physical exam, and tests, such as an MRI, a CT scan, or an X-ray. This information is not intended to replace advice given to you by your health care provider. Make sure you discuss any questions you have with your health care provider. Document Revised: 06/06/2019 Document Reviewed: 06/06/2019 [employer] Patient Education  [place].  Cardiac CT calcium scoring test $99 Tel # is [phone]   Computed tomography, more commonly known as a CT or CAT scan, is a diagnostic medical imaging test. Like traditional x-rays, it produces multiple images or pictures of the inside of the body.C

## 2021-06-16 NOTE — Assessment & Plan Note (Signed)
Check TSH Diet discussed

## 2021-06-23 ENCOUNTER — Ambulatory Visit
Admission: RE | Admit: 2021-06-23 | Discharge: 2021-06-23 | Disposition: A | Discharge status: Home / Self Care | Payer: PRIVATE HEALTH INSURANCE | Source: Ambulatory Visit | Attending: Internal Medicine | Admitting: Internal Medicine

## 2021-06-23 ENCOUNTER — Other Ambulatory Visit: Payer: Self-pay

## 2021-06-23 DIAGNOSIS — Z1231 Encounter for screening mammogram for malignant neoplasm of breast: Secondary | ICD-10-CM

## 2021-09-12 ENCOUNTER — Encounter: Payer: Self-pay | Admitting: Internal Medicine

## 2021-09-12 ENCOUNTER — Other Ambulatory Visit: Payer: Self-pay | Admitting: Internal Medicine

## 2021-09-12 DIAGNOSIS — F3289 Other specified depressive episodes: Secondary | ICD-10-CM

## 2021-09-14 ENCOUNTER — Encounter: Payer: Self-pay | Admitting: Internal Medicine

## 2021-09-15 NOTE — Telephone Encounter (Signed)
Pt sent msg Lumbar Spine 2-3 view never been scheduled. Pls advise...Jennifer Quinn

## 2021-09-16 ENCOUNTER — Ambulatory Visit (INDEPENDENT_AMBULATORY_CARE_PROVIDER_SITE_OTHER)
Admission: RE | Admit: 2021-09-16 | Discharge: 2021-09-16 | Disposition: A | Discharge status: Home / Self Care | Payer: PRIVATE HEALTH INSURANCE | Source: Ambulatory Visit | Attending: Internal Medicine | Admitting: Internal Medicine

## 2021-09-16 ENCOUNTER — Other Ambulatory Visit: Payer: Self-pay

## 2021-09-16 DIAGNOSIS — R29898 Other symptoms and signs involving the musculoskeletal system: Secondary | ICD-10-CM | POA: Diagnosis not present

## 2021-09-16 DIAGNOSIS — R202 Paresthesia of skin: Secondary | ICD-10-CM | POA: Diagnosis not present

## 2021-09-27 ENCOUNTER — Other Ambulatory Visit: Payer: Self-pay | Admitting: Internal Medicine

## 2021-09-27 DIAGNOSIS — F3289 Other specified depressive episodes: Secondary | ICD-10-CM

## 2021-09-30 ENCOUNTER — Encounter: Payer: Self-pay | Admitting: Internal Medicine

## 2021-10-07 ENCOUNTER — Ambulatory Visit: Payer: Self-pay | Admitting: Nurse Practitioner

## 2021-10-07 ENCOUNTER — Other Ambulatory Visit: Payer: Self-pay

## 2021-10-07 ENCOUNTER — Encounter: Payer: Self-pay | Admitting: Nurse Practitioner

## 2021-10-07 VITALS — BP 122/82 | HR 81 | Temp 98.0°F | Resp 16 | Ht 62.0 in | Wt 243.0 lb

## 2021-10-07 DIAGNOSIS — R202 Paresthesia of skin: Secondary | ICD-10-CM

## 2021-10-07 DIAGNOSIS — R030 Elevated blood-pressure reading, without diagnosis of hypertension: Secondary | ICD-10-CM

## 2021-10-07 DIAGNOSIS — E119 Type 2 diabetes mellitus without complications: Secondary | ICD-10-CM

## 2021-10-07 DIAGNOSIS — Z789 Other specified health status: Secondary | ICD-10-CM

## 2021-10-07 DIAGNOSIS — K219 Gastro-esophageal reflux disease without esophagitis: Secondary | ICD-10-CM

## 2021-10-07 NOTE — Progress Notes (Addendum)
Established Patient Office Visit  Subjective:  Patient ID: Jennifer Quinn, female    DOB: 13-Feb-1956  Age: 66 y.o. MRN: [id number]  CC:  Chief Complaint  Patient presents with   Annual Exam    HPI Jennifer Quinn presents for annual wellness examination. She states she has right buttock and upper thigh numbness today. She has taken mobic for it and recently received a steroid injection. She is having some relief after taking medication. She states she notices this occurs when she eats a lot of sugar.   Otherwise, no concerns for todays visit.   Past Medical History:  Diagnosis Date   Asthma    Chest pain    Chronic constipation    Depression    Fibroid    GERD (gastroesophageal reflux disease)    occ   History of anemia    Hypertension    PMB (postmenopausal bleeding)    Vitamin D deficiency    Wears contact lenses    Wears partial dentures    upper and lower    Past Surgical History:  Procedure Laterality Date   COLONOSCOPY  2007, 01/25/2011. 05/25/16   DILATATION & CURETTAGE/HYSTEROSCOPY WITH MYOSURE N/A 07/23/2018   Procedure: DILATATION & CURETTAGE/HYSTEROSCOPY WITH MYOSURE;  Surgeon: Salvadore Dom, MD;  Location: [facility];  Service: Gynecology;  Laterality: N/A;  Follow 1st case. Use [facility] virtual OR in place of [facility] block time.    Family History  Problem Relation Age of Onset   Colon cancer Mother    Diabetes Mother    Obesity Mother    Breast cancer Sister    Stroke Father    Alcoholism Father    Thyroid disease Daughter    Multiple sclerosis Daughter     Social History   Socioeconomic History   Marital status: Married    Spouse name: Shelbee Apgar   Number of children: 2   Years of education: Not on file   Highest education level: Not on file  Occupational History   Not on file  Tobacco Use   Smoking status: Former    Years: 10.00    Types: Cigarettes    Quit date: 07/18/1985    Years since quitting: 36.2   Smokeless  tobacco: Never  Vaping Use   Vaping Use: Never used  Substance and Sexual Activity   Alcohol use: Yes    Alcohol/week: 1.0 standard drink    Types: 1 Standard drinks or equivalent per week    Comment: seldom   Drug use: Never   Sexual activity: Yes    Partners: Male    Birth control/protection: Post-menopausal  Other Topics Concern   Not on file  Social History Narrative   Lives at home with daughter and husband.    High stress lifestyle. 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    Husband recently had an MI in Jan 2023.   Social Determinants of Health   Financial Resource Strain: Not on file  Food Insecurity: Not on file  Transportation Needs: Not on file  Physical Activity: Not on file  Stress: Not on file  Social Connections: Not on file  Intimate Partner Violence: Not on file    Outpatient Medications Prior to Visit  Medication Sig Dispense Refill   buPROPion (WELLBUTRIN) 100 MG tablet TAKE 1 TABLET BY MOUTH EVERY DAY 30 tablet 11   Cholecalciferol (VITAMIN D  PO) Take 1,000 Units by mouth daily.      Fish Oil OIL Take 2,000 mg by mouth daily.     hydrochlorothiazide (MICROZIDE) 12.5 MG capsule TAKE 1 CAPSULE (12.5 MG TOTAL) BY MOUTH 2 (TWO) TIMES DAILY. 60 capsule 11   meloxicam (MOBIC) 15 MG tablet Take 15 mg by mouth daily as needed.     metFORMIN (GLUCOPHAGE) 500 MG tablet Take 1 tablet (500 mg total) by mouth daily with breakfast. 90 tablet 3   omeprazole (PRILOSEC) 20 MG capsule Take 20 mg by mouth daily.     potassium chloride SA (KLOR-CON M20) 20 MEQ tablet Take 1 tablet (20 mEq total) by mouth daily. 90 tablet 3   methylPREDNISolone (MEDROL DOSEPAK) 4 MG TBPK tablet As directed 21 tablet 0   No facility-administered medications prior to visit.    No Known Allergies  ROS Review of Systems  Constitutional:  Negative for activity change, appetite change, chills, fever and  unexpected weight change.  HENT:  Negative for congestion, ear pain, hearing loss, sinus pressure, sinus pain, sore throat and trouble swallowing.   Eyes:  Negative for photophobia, pain and discharge.  Respiratory: Negative.    Cardiovascular: Negative.   Gastrointestinal: Negative.   Endocrine: Negative.   Genitourinary: Negative.   Musculoskeletal: Negative.   Skin: Negative.   Allergic/Immunologic: Positive for environmental allergies.  Neurological:  Positive for numbness. Negative for weakness.       Left buttock and thigh  Hematological: Negative.   Psychiatric/Behavioral: Negative.       Objective:    Physical Exam Constitutional:      General: She is not in acute distress.    Appearance: Normal appearance. She is obese. She is not ill-appearing.  HENT:     Head: Normocephalic and atraumatic.     Right Ear: Tympanic membrane normal.     Left Ear: Tympanic membrane normal.     Nose: Nose normal.     Mouth/Throat:     Mouth: Mucous membranes are moist.     Pharynx: Oropharynx is clear.  Eyes:     Extraocular Movements: Extraocular movements intact.     Conjunctiva/sclera: Conjunctivae normal.     Pupils: Pupils are equal, round, and reactive to light.  Neck:     Vascular: No carotid bruit.  Cardiovascular:     Rate and Rhythm: Normal rate and regular rhythm.     Pulses: Normal pulses.     Heart sounds: Normal heart sounds.  Pulmonary:     Effort: Pulmonary effort is normal.     Breath sounds: Normal breath sounds.  Abdominal:     General: Bowel sounds are normal.     Palpations: Abdomen is soft.  Musculoskeletal:        General: No swelling, tenderness or deformity.     Cervical back: Normal range of motion.     Right lower leg: No edema.     Left lower leg: No edema.  Skin:    General: Skin is warm and dry.     Capillary Refill: Capillary refill takes less than 2 seconds.  Neurological:     General: No focal deficit present.     Mental Status: She is  alert and oriented to person, place, and time. Mental status is at baseline.  Psychiatric:        Mood and Affect: Mood normal.        Behavior: Behavior normal.        Thought Content: Thought content normal.  Judgment: Judgment normal.    BP 122/82 (BP Location: Right Arm, Patient Position: Sitting, Cuff Size: Normal)    Pulse 81    Temp 98 F (36.7 C) (Temporal)    Resp 16    Ht _0  (1.575 m)    Wt 243 lb (110.2 kg)    SpO2 97%    BMI 44.45 kg/m  Wt Readings from Last 3 Encounters:  10/07/21 243 lb (110.2 kg)  06/16/21 244 lb (110.7 kg)  04/20/21 235 lb (106.6 kg)     Health Maintenance Due  Topic Date Due   COVID-19 Vaccine (4 - Booster for Pfizer series) 07/31/2020   COLONOSCOPY (Pts 45-82yr Insurance coverage will need to be confirmed)  05/25/2021   Pneumonia Vaccine 68 Years old (2 - PPSV23 if available, else PCV20) 08/18/2021    There are no preventive care reminders to display for this patient.  Lab Results  Component Value Date   TSH 4.68 06/16/2021   Lab Results  Component Value Date   WBC 4.6 06/16/2021   HGB 13.1 06/16/2021   HCT 39.4 06/16/2021   MCV 83.1 06/16/2021   PLT 197.0 06/16/2021   Lab Results  Component Value Date   NA 139 06/16/2021   K 3.9 06/16/2021   CO2 29 06/16/2021   GLUCOSE 107 (H) 06/16/2021   BUN 19 06/16/2021   CREATININE 0.93 06/16/2021   BILITOT 0.5 06/16/2021   ALKPHOS 54 06/16/2021   AST 15 06/16/2021   ALT 11 06/16/2021   PROT 7.3 06/16/2021   ALBUMIN 4.4 06/16/2021   CALCIUM 9.7 06/16/2021   ANIONGAP 9 07/23/2018   EGFR 78 04/20/2021   GFR 63.78 06/16/2021   Lab Results  Component Value Date   CHOL 179 08/26/2020   Lab Results  Component Value Date   HDL 40.80 08/26/2020   Lab Results  Component Value Date   LDLCALC 121 (H) 08/26/2020   Lab Results  Component Value Date   TRIG 82.0 08/26/2020   Lab Results  Component Value Date   CHOLHDL 4 08/26/2020   Lab Results  Component Value Date    HGBA1C 5.9 08/26/2020      Assessment & Plan:   Problem List Items Addressed This Visit       Digestive   GERD (gastroesophageal reflux disease)   Relevant Medications   omeprazole (PRILOSEC) 20 MG capsule  Stable. Continue regimen.      Other   ELEVATED BLOOD PRESSURE  Stable. Elevated in office today. Continue HCTZ and follow up with PCP. Albuminuria and CMP will be drawn in future appointment with fasting labs.    Paresthesias  Ongoing, stable. Continue regimen.    Other Visit Diagnoses     Participant in health and wellness plan    -  Primary  Reinforced importance of well balanced, low sodium, low carb diet and daily activity.  Shingrix vaccine ordered  RTC in August for fasting Lipid panel, CBC w/ diff, CMP, Hep C, and albuminuria   Diabetes mellitus without complication ([facility]      Continue taking metformin.  Educated and encouraged low carb diet Informed of lunch and learn with dietician to be scheduled soon for employees for healthy cooking tips.  A1C to be drawn in August with other labs.   Obesity, Morbid Educated and counseled on importance of lifestyle changes to lose weight She states this is her top goal for the year. Counseled on focusing on losing 5 lbs at a time, eating less fast  foods, and increasing physical activity. Pt. Agreeable to plan    Advised to follow up with PCP regarding chronic disease processes.  RTC in 1 week for shingrix vaccine, or sooner as needed.   No orders of the defined types were placed in this encounter.   Follow-up: No follow-ups on file.    Drucilla Chalet, NP

## 2021-10-11 ENCOUNTER — Encounter: Payer: Self-pay | Admitting: Internal Medicine

## 2021-10-13 ENCOUNTER — Other Ambulatory Visit: Payer: Self-pay

## 2021-10-13 ENCOUNTER — Ambulatory Visit
Admission: RE | Admit: 2021-10-13 | Discharge: 2021-10-13 | Disposition: A | Discharge status: Home / Self Care | Payer: Self-pay | Source: Ambulatory Visit | Attending: Internal Medicine | Admitting: Internal Medicine

## 2021-10-13 DIAGNOSIS — E785 Hyperlipidemia, unspecified: Secondary | ICD-10-CM

## 2021-10-14 ENCOUNTER — Encounter: Payer: Self-pay | Admitting: Internal Medicine

## 2021-10-14 ENCOUNTER — Ambulatory Visit: Payer: Self-pay | Admitting: Nurse Practitioner

## 2021-10-14 VITALS — Temp 98.8°F

## 2021-10-14 DIAGNOSIS — I700 Atherosclerosis of aorta: Secondary | ICD-10-CM | POA: Insufficient documentation

## 2021-10-14 DIAGNOSIS — I2510 Atherosclerotic heart disease of native coronary artery without angina pectoris: Secondary | ICD-10-CM | POA: Insufficient documentation

## 2021-10-14 DIAGNOSIS — Z23 Encounter for immunization: Secondary | ICD-10-CM

## 2021-10-14 NOTE — Progress Notes (Signed)
Acute Office Visit  Subjective:    Patient ID: Jennifer Quinn, female    DOB: [birth year], 66 y.o.   MRN: [id number]  No chief complaint on file.   HPI Patient is in today for 1st dose of Shingrix vaccine. She denies recent illness, fevers, or chills.   Past Medical History:  Diagnosis Date   Asthma    Chest pain    Chronic constipation    Depression    Fibroid    GERD (gastroesophageal reflux disease)    occ   History of anemia    Hypertension    PMB (postmenopausal bleeding)    Vitamin D deficiency    Wears contact lenses    Wears partial dentures    upper and lower    Past Surgical History:  Procedure Laterality Date   COLONOSCOPY  2007, 01/25/2011. 05/25/16   DILATATION & CURETTAGE/HYSTEROSCOPY WITH MYOSURE N/A 07/23/2018   Procedure: DILATATION & CURETTAGE/HYSTEROSCOPY WITH MYOSURE;  Surgeon: Salvadore Dom, MD;  Location: [facility];  Service: Gynecology;  Laterality: N/A;  Follow 1st case. Use [facility] in place of [facility] block time.    Family History  Problem Relation Age of Onset   Colon cancer Mother    Diabetes Mother    Obesity Mother    Breast cancer Sister    Stroke Father    Alcoholism Father    Thyroid disease Daughter    Multiple sclerosis Daughter     Social History   Socioeconomic History   Marital status: Married    Spouse name: Halla Chopp   Number of children: 2   Years of education: Not on file   Highest education level: Not on file  Occupational History   Not on file  Tobacco Use   Smoking status: Former    Years: 10.00    Types: Cigarettes    Quit date: 07/18/1985    Years since quitting: 36.2   Smokeless tobacco: Never  Vaping Use   Vaping Use: Never used  Substance and Sexual Activity   Alcohol use: Yes    Alcohol/week: 1.0 standard drink    Types: 1 Standard drinks or equivalent per week    Comment: seldom   Drug use: Never   Sexual activity: Yes    Partners: Male    Birth control/protection:  Post-menopausal  Other Topics Concern   Not on file  Social History Narrative   Lives at home with daughter and husband.    High stress lifestyle. 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    Husband recently had an MI in Jan 2023.   Social Determinants of Health   Financial Resource Strain: Not on file  Food Insecurity: Not on file  Transportation Needs: Not on file  Physical Activity: Not on file  Stress: Not on file  Social Connections: Not on file  Intimate Partner Violence: Not on file    Outpatient Medications Prior to Visit  Medication Sig Dispense Refill   buPROPion (WELLBUTRIN) 100 MG tablet TAKE 1 TABLET BY MOUTH EVERY DAY 30 tablet 11   Cholecalciferol (VITAMIN D PO) Take 1,000 Units by mouth daily.      Fish Oil OIL Take 2,000 mg by mouth daily.     hydrochlorothiazide (MICROZIDE) 12.5 MG capsule TAKE 1 CAPSULE (12.5 MG TOTAL) BY MOUTH 2 (TWO) TIMES DAILY. 60 capsule 11   meloxicam (MOBIC)  15 MG tablet Take 15 mg by mouth daily as needed.     metFORMIN (GLUCOPHAGE) 500 MG tablet Take 1 tablet (500 mg total) by mouth daily with breakfast. 90 tablet 3   omeprazole (PRILOSEC) 20 MG capsule Take 20 mg by mouth daily.     potassium chloride SA (KLOR-CON M20) 20 MEQ tablet Take 1 tablet (20 mEq total) by mouth daily. 90 tablet 3   No facility-administered medications prior to visit.    No Known Allergies  Review of Systems See HPI    Objective:    Physical Exam Constitutional:      Appearance: Normal appearance.  HENT:     Head: Normocephalic and atraumatic.  Skin:    General: Skin is warm and dry.  Neurological:     Mental Status: She is alert.    Temp 98.8 F (37.1 C) (Temporal)  Wt Readings from Last 3 Encounters:  10/07/21 243 lb (110.2 kg)  06/16/21 244 lb (110.7 kg)  04/20/21 235 lb (106.6 kg)    Health Maintenance Due  Topic Date Due   COVID-19 Vaccine (4 -  Booster for Pfizer series) 07/31/2020   COLONOSCOPY (Pts 45-13yr Insurance coverage will need to be confirmed)  05/25/2021   Pneumonia Vaccine 68 Years old (2 - PPSV23 if available, else PCV20) 08/18/2021    There are no preventive care reminders to display for this patient.   Lab Results  Component Value Date   TSH 4.68 06/16/2021   Lab Results  Component Value Date   WBC 4.6 06/16/2021   HGB 13.1 06/16/2021   HCT 39.4 06/16/2021   MCV 83.1 06/16/2021   PLT 197.0 06/16/2021   Lab Results  Component Value Date   NA 139 06/16/2021   K 3.9 06/16/2021   CO2 29 06/16/2021   GLUCOSE 107 (H) 06/16/2021   BUN 19 06/16/2021   CREATININE 0.93 06/16/2021   BILITOT 0.5 06/16/2021   ALKPHOS 54 06/16/2021   AST 15 06/16/2021   ALT 11 06/16/2021   PROT 7.3 06/16/2021   ALBUMIN 4.4 06/16/2021   CALCIUM 9.7 06/16/2021   ANIONGAP 9 07/23/2018   EGFR 78 04/20/2021   GFR 63.78 06/16/2021   Lab Results  Component Value Date   CHOL 179 08/26/2020   Lab Results  Component Value Date   HDL 40.80 08/26/2020   Lab Results  Component Value Date   LDLCALC 121 (H) 08/26/2020   Lab Results  Component Value Date   TRIG 82.0 08/26/2020   Lab Results  Component Value Date   CHOLHDL 4 08/26/2020   Lab Results  Component Value Date   HGBA1C 5.9 08/26/2020       Assessment & Plan:   Problem List Items Addressed This Visit   None Visit Diagnoses     Immunization due    -  Primary  1st Dose of Shingrix given today. CDC VIS given prior to injection.  Pt. Tolerated well. No immediate adverse reactions noted. Educated and discussed red flag symptoms and to call 911 or go to emergency room with signs of anaphylaxis.  Take OTC tylenol as needed for expected side effects such as muscle aches or fever.  Patient given the chance to ask all questions and discussed answers.  RTC in 2 months for 2nd dose of shingrix vaccine to complete series, or sooner as needed.       No orders  of the defined types were placed in this encounter.    MDrucilla Chalet NP

## 2021-10-17 ENCOUNTER — Other Ambulatory Visit: Payer: Self-pay | Admitting: Internal Medicine

## 2021-10-18 ENCOUNTER — Other Ambulatory Visit: Payer: Self-pay | Admitting: Internal Medicine

## 2021-10-19 ENCOUNTER — Other Ambulatory Visit (HOSPITAL_COMMUNITY): Payer: Self-pay

## 2021-10-19 MED ORDER — MELOXICAM 15 MG PO TABS
15.0000 mg | ORAL_TABLET | Freq: Every day | ORAL | 3 refills | Status: DC | PRN
  Filled 2021-10-19: qty 60, 60d supply, fill #0

## 2021-11-15 ENCOUNTER — Other Ambulatory Visit: Payer: Self-pay

## 2021-11-15 ENCOUNTER — Ambulatory Visit (AMBULATORY_SURGERY_CENTER): Payer: PRIVATE HEALTH INSURANCE | Admitting: *Deleted

## 2021-11-15 VITALS — Ht 62.0 in | Wt 238.0 lb

## 2021-11-15 DIAGNOSIS — Z800 Family history of malignant neoplasm of digestive organs: Secondary | ICD-10-CM

## 2021-11-15 NOTE — Progress Notes (Signed)

## 2021-11-22 ENCOUNTER — Encounter: Payer: Self-pay | Admitting: Internal Medicine

## 2021-11-29 ENCOUNTER — Encounter: Payer: Self-pay | Admitting: Internal Medicine

## 2021-11-29 ENCOUNTER — Ambulatory Visit (AMBULATORY_SURGERY_CENTER): Payer: PRIVATE HEALTH INSURANCE | Admitting: Internal Medicine

## 2021-11-29 VITALS — BP 149/78 | HR 62 | Temp 97.5°F | Resp 14 | Ht 62.0 in | Wt 238.0 lb

## 2021-11-29 DIAGNOSIS — Z800 Family history of malignant neoplasm of digestive organs: Secondary | ICD-10-CM | POA: Diagnosis not present

## 2021-11-29 DIAGNOSIS — Z86010 Personal history of colonic polyps: Secondary | ICD-10-CM

## 2021-11-29 MED ORDER — SODIUM CHLORIDE 0.9 % IV SOLN: 500.0000 mL | Freq: Once | INTRAVENOUS | Status: DC

## 2021-11-29 NOTE — Progress Notes (Signed)
[facility] History and Physical ? ? ?Primary Care Physician:  Cassandria Anger, MD ? ? ?Reason for Procedure:   FHx CRCA and screening ? ?Plan:    colonoscopy ? ? ? ? ?HPI: Jennifer Quinn is a 66 y.o. female here for screening exam w/ FHx CRCA ? ? ?Past Medical History:  ?Diagnosis Date  ? Asthma   ? Chest pain   ? Chronic constipation   ? Depression   ? Fibroid   ? GERD (gastroesophageal reflux disease)   ? occ  ? History of anemia   ? Hypertension   ? PMB (postmenopausal bleeding)   ? Vitamin D deficiency   ? Wears contact lenses   ? Wears partial dentures   ? upper and lower  ? ? ?Past Surgical History:  ?Procedure Laterality Date  ? COLONOSCOPY  2007, 01/25/2011. 05/25/16  ? DILATATION & CURETTAGE/HYSTEROSCOPY WITH MYOSURE N/A 07/23/2018  ? Procedure: DILATATION & CURETTAGE/HYSTEROSCOPY WITH MYOSURE;  Surgeon: Salvadore Dom, MD;  Location: [facility];  Service: Gynecology;  Laterality: N/A;  Follow 1st case. Use [facility] in [facility] of [facility] block time.  ? ? ?Prior to Admission medications   ?Medication Sig Start Date End Date Taking? Authorizing Provider  ?acetaminophen (TYLENOL) 500 MG tablet Take 500 mg by mouth every 6 (six) hours as needed.   Yes [provider]  ?buPROPion (WELLBUTRIN) 100 MG tablet TAKE 1 TABLET BY MOUTH EVERY DAY 09/13/21  Yes Plotnikov, Evie Lacks, MD  ?Cholecalciferol (VITAMIN D PO) Take 1,000 Units by mouth daily.    Yes [provider]  ?diclofenac Sodium (VOLTAREN) 1 % GEL Apply 1 application topically 4 (four) times daily. 10/08/21  Yes [provider]  ?Fish Oil OIL Take 2,000 mg by mouth daily.   Yes [provider]  ?hydrochlorothiazide (MICROZIDE) 12.5 MG capsule TAKE 1 CAPSULE (12.5 MG TOTAL) BY MOUTH 2 (TWO) TIMES DAILY. 09/13/21  Yes Plotnikov, Evie Lacks, MD  ?ibuprofen (ADVIL) 200 MG tablet Take 200 mg by mouth every 6 (six) hours as needed.   Yes [provider]  ?metFORMIN (GLUCOPHAGE) 500 MG  tablet Take 1 tablet (500 mg total) by mouth daily with breakfast. 08/27/20  Yes Plotnikov, Evie Lacks, MD  ?omeprazole (PRILOSEC) 20 MG capsule Take 20 mg by mouth daily. ?Patient not taking: Reported on 11/29/2021 08/09/21   [provider]  ? ? ?Current Outpatient Medications  ?Medication Sig Dispense Refill  ? acetaminophen (TYLENOL) 500 MG tablet Take 500 mg by mouth every 6 (six) hours as needed.    ? buPROPion (WELLBUTRIN) 100 MG tablet TAKE 1 TABLET BY MOUTH EVERY DAY 30 tablet 11  ? Cholecalciferol (VITAMIN D PO) Take 1,000 Units by mouth daily.     ? diclofenac Sodium (VOLTAREN) 1 % GEL Apply 1 application topically 4 (four) times daily.    ? Fish Oil OIL Take 2,000 mg by mouth daily.    ? hydrochlorothiazide (MICROZIDE) 12.5 MG capsule TAKE 1 CAPSULE (12.5 MG TOTAL) BY MOUTH 2 (TWO) TIMES DAILY. 60 capsule 11  ? ibuprofen (ADVIL) 200 MG tablet Take 200 mg by mouth every 6 (six) hours as needed.    ? metFORMIN (GLUCOPHAGE) 500 MG tablet Take 1 tablet (500 mg total) by mouth daily with breakfast. 90 tablet 3  ? omeprazole (PRILOSEC) 20 MG capsule Take 20 mg by mouth daily. (Patient not taking: Reported on 11/29/2021)    ? ?Current Facility-Administered Medications  ?Medication Dose Route Frequency Provider Last  Rate Last Admin  ? 0.9 %  sodium chloride infusion  500 mL Intravenous Once Gatha Mayer, MD      ? ? ?Allergies as of 11/29/2021  ? (No Known Allergies)  ? ? ?Family History  ?Problem Relation Age of Onset  ? Colon cancer Mother 32  ? Diabetes Mother   ? Obesity Mother   ? Stroke Father   ? Alcoholism Father   ? Breast cancer Sister   ? Thyroid disease Daughter   ? Multiple sclerosis Daughter   ? Colon polyps Neg Hx   ? Esophageal cancer Neg Hx   ? Rectal cancer Neg Hx   ? Stomach cancer Neg Hx   ? ? ?Social History  ? ?Socioeconomic History  ? Marital status: Married  ?  Spouse name: Neta Upadhyay  ? Number of children: 2  ? Years of education: Not on file  ? Highest education level: Not on  file  ?Occupational History  ? Not on file  ?Tobacco Use  ? Smoking status: Former  ?  Years: 10.00  ?  Types: Cigarettes  ?  Quit date: 07/18/1985  ?  Years since quitting: 36.3  ? Smokeless tobacco: Never  ?Vaping Use  ? Vaping Use: Never used  ?Substance and Sexual Activity  ? Alcohol use: Not Currently  ?  Comment: seldom  ? Drug use: Never  ? Sexual activity: Yes  ?  Partners: Male  ?  Birth control/protection: Post-menopausal  ?Other Topics Concern  ? Not on file  ?Social History Narrative  ? Lives at home with daughter and husband.   ? High stress lifestyle. 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   ? 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   ? Husband recently had an MI in Jan 2023.  ? ?Social Determinants of Health  ? ?Financial Resource Strain: Not on file  ?Food Insecurity: Not on file  ?Transportation Needs: Not on file  ?Physical Activity: Not on file  ?Stress: Not on file  ?Social Connections: Not on file  ?Intimate Partner Violence: Not on file  ? ? ?Review of Systems: ? ?All other review of systems negative except as mentioned in the HPI. ? ?Physical Exam: ?Vital signs ?BP (!) 142/66 (BP Location: Right Arm, Patient Position: Sitting, Cuff Size: Normal)   Pulse 70   Temp (!) 97.5 ?F (36.4 ?C) (Temporal)   Ht '5\' 2"'$  (1.575 m)   Wt 238 lb (108 kg)   SpO2 95%   BMI 43.53 kg/m?  ? ?General:   Alert,  Well-developed, well-nourished, pleasant and cooperative in NAD ?Lungs:  Clear throughout to auscultation.   ?Heart:  Regular rate and rhythm; no murmurs, clicks, rubs,  or gallops. ?Abdomen:  Soft, nontender and nondistended. Normal bowel sounds.   ?Neuro/Psych:  Alert and cooperative. Normal mood and affect. A and O x 3 ? ? ?'@Geovanni Rahming'$  Simonne Maffucci, MD, [facility] ?[facility] ?[phone] (pager) ?11/29/2021 2:15 PM@ ? ?

## 2021-11-29 NOTE — Patient Instructions (Addendum)
No polyps or cancer were seen again, fortunately. ? ?Your next routine colonoscopy should be in 5 years - 2028. ? ?I appreciate the opportunity to care for you. ?Gatha Mayer, MD, [facility] ? ? ?YOU HAD AN ENDOSCOPIC PROCEDURE TODAY AT [facility]:   Refer to the procedure report that was given to you for any specific questions about what was found during the examination.  If the procedure report does not answer your questions, please call your gastroenterologist to clarify.  If you requested that your care partner not be given the details of your procedure findings, then the procedure report has been included in a sealed envelope for you to review at your convenience later. ? ?YOU SHOULD EXPECT: Some feelings of bloating in the abdomen. Passage of more gas than usual.  Walking can help get rid of the air that was put into your GI tract during the procedure and reduce the bloating. If you had a lower endoscopy (such as a colonoscopy or flexible sigmoidoscopy) you may notice spotting of blood in your stool or on the toilet paper. If you underwent a bowel prep for your procedure, you may not have a normal bowel movement for a few days. ? ?Please Note:  You might notice some irritation and congestion in your nose or some drainage.  This is from the oxygen used during your procedure.  There is no need for concern and it should clear up in a day or so. ? ?SYMPTOMS TO REPORT IMMEDIATELY: ? ?Following lower endoscopy (colonoscopy or flexible sigmoidoscopy): ? Excessive amounts of blood in the stool ? Significant tenderness or worsening of abdominal pains ? Swelling of the abdomen that is new, acute ? Fever of 100?F or higher ? ? ?For urgent or emergent issues, a gastroenterologist can be reached at any hour by calling [phone]. ?Do not use MyChart messaging for urgent concerns.  ? ? ?DIET:  We do recommend a small meal at first, but then you may proceed to your regular diet.  Drink plenty of fluids but  you should avoid alcoholic beverages for 24 hours. ? ?ACTIVITY:  You should plan to take it easy for the rest of today and you should NOT DRIVE or use heavy machinery until tomorrow (because of the sedation medicines used during the test).   ? ?FOLLOW UP: ?Our staff will call the number listed on your records 48-72 hours following your procedure to check on you and address any questions or concerns that you may have regarding the information given to you following your procedure. If we do not reach you, we will leave a message.  We will attempt to reach you two times.  During this call, we will ask if you have developed any symptoms of COVID 19. If you develop any symptoms (ie: fever, flu-like symptoms, shortness of breath, cough etc.) before then, please call [phone].  If you test positive for Covid 19 in the 2 weeks post procedure, please call and report this information to [place].   ? ?If any biopsies were taken you will be contacted by phone or by letter within the next 1-3 weeks.  Please call us at [phone] if you have not heard about the biopsies in 3 weeks.  ? ? ?SIGNATURES/CONFIDENTIALITY: ?You and/or your care partner have signed paperwork which will be entered into your electronic medical record.  These signatures attest to the fact that that the information above on your After Visit Summary has been reviewed and is  understood.  Full responsibility of the confidentiality of this discharge information lies with you and/or your care-partner.  ?

## 2021-11-29 NOTE — Progress Notes (Signed)
Vitals-AS ?

## 2021-11-29 NOTE — Progress Notes (Signed)
A and O x3. Report to RN. Tolerated MAC anesthesia well. 

## 2021-11-29 NOTE — Op Note (Signed)
[facility] ?Patient Name: Jennifer Quinn ?Procedure Date: 11/29/2021 2:04 PM ?MRN: [id number] ?Endoscopist: Gatha Mayer , MD ?Age: 66 ?Referring MD:  ?Date of Birth: [birth year] ?Gender: Female ?Account #: [id number] ?Procedure:                Colonoscopy ?Indications:              Screening in patient at increased risk: Colorectal  ?                          cancer in mother 41 or older ?Medicines:                Monitored Anesthesia Care ?Procedure:                Pre-Anesthesia Assessment: ?                          - Prior to the procedure, a History and Physical  ?                          was performed, and patient medications and  ?                          allergies were reviewed. The patient's tolerance of  ?                          previous anesthesia was also reviewed. The risks  ?                          and benefits of the procedure and the sedation  ?                          options and risks were discussed with the patient.  ?                          All questions were answered, and informed consent  ?                          was obtained. Prior Anticoagulants: The patient has  ?                          taken no previous anticoagulant or antiplatelet  ?                          agents. ASA Grade Assessment: II - A patient with  ?                          mild systemic disease. After reviewing the risks  ?                          and benefits, the patient was deemed in  ?                          satisfactory condition to undergo the procedure. ?  After obtaining informed consent, the colonoscope  ?                          was passed under direct vision. Throughout the  ?                          procedure, the patient's blood pressure, pulse, and  ?                          oxygen saturations were monitored continuously. The  ?                          CF [id number] #[id number] was introduced through the anus  ?                          and advanced to the the cecum,  identified by  ?                          appendiceal orifice and ileocecal valve. The  ?                          colonoscopy was performed without difficulty. The  ?                          patient tolerated the procedure well. The quality  ?                          of the bowel preparation was good. The bowel  ?                          preparation used was Miralax via split dose  ?                          instruction. The ileocecal valve, appendiceal  ?                          orifice, and rectum were photographed. ?Scope In: 2:24:03 PM ?Scope Out: 2:34:31 PM ?Scope Withdrawal Time: 0 hours 8 minutes 23 seconds  ?Total Procedure Duration: 0 hours 10 minutes 28 seconds  ?Findings:                 The perianal and digital rectal examinations were  ?                          normal. ?                          The entire examined colon appeared normal on direct  ?                          and retroflexion views. ?Complications:            No immediate complications. ?Estimated Blood Loss:     Estimated blood loss: none. ?Impression:               - The entire examined  colon is normal on direct and  ?                          retroflexion views. ?                          - No specimens collected. ?Recommendation:           - Patient has a contact number available for  ?                          emergencies. The signs and symptoms of potential  ?                          delayed complications were discussed with the  ?                          patient. Return to normal activities tomorrow.  ?                          Written discharge instructions were provided to the  ?                          patient. ?                          - Resume previous diet. ?                          - Continue present medications. ?                          - Repeat colonoscopy in 5 years for screening  ?                          purposes. Mother was 59 at dx but died at 19 so  ?                          have done every 5 yrs ?Gatha Mayer, MD ?11/29/2021 2:40:53 PM ?This report has been signed electronically. ?

## 2021-12-01 ENCOUNTER — Telehealth: Payer: Self-pay | Admitting: *Deleted

## 2021-12-01 NOTE — Telephone Encounter (Signed)
?  Follow up Call- ? ? ?  11/29/2021  ?  1:01 PM  ?Call back number  ?Post procedure Call Back phone  # [phone]  ?Permission to leave phone message Yes  ?  ? ?Patient questions: ? ?Do you have a fever, pain , or abdominal swelling? No. ?Pain Score  0 * ? ?Have you tolerated food without any problems? Yes.   ? ?Have you been able to return to your normal activities? Yes.   ? ?Do you have any questions about your discharge instructions: ?Diet   No. ?Medications  No. ?Follow up visit  No. ? ?Do you have questions or concerns about your Care? No. ? ?Actions: ?* If pain score is 4 or above: ?No action needed, pain <4. ? ? ?

## 2021-12-15 ENCOUNTER — Other Ambulatory Visit: Payer: Self-pay

## 2021-12-15 ENCOUNTER — Encounter (HOSPITAL_BASED_OUTPATIENT_CLINIC_OR_DEPARTMENT_OTHER): Payer: Self-pay

## 2021-12-15 ENCOUNTER — Emergency Department (HOSPITAL_BASED_OUTPATIENT_CLINIC_OR_DEPARTMENT_OTHER)
Admission: EM | Admit: 2021-12-15 | Discharge: 2021-12-15 | Disposition: A | Discharge status: Home / Self Care | Payer: PRIVATE HEALTH INSURANCE | Attending: Emergency Medicine | Admitting: Emergency Medicine

## 2021-12-15 DIAGNOSIS — M5412 Radiculopathy, cervical region: Secondary | ICD-10-CM | POA: Insufficient documentation

## 2021-12-15 DIAGNOSIS — Z79899 Other long term (current) drug therapy: Secondary | ICD-10-CM | POA: Insufficient documentation

## 2021-12-15 DIAGNOSIS — M25511 Pain in right shoulder: Secondary | ICD-10-CM | POA: Insufficient documentation

## 2021-12-15 DIAGNOSIS — R200 Anesthesia of skin: Secondary | ICD-10-CM | POA: Insufficient documentation

## 2021-12-15 DIAGNOSIS — J45909 Unspecified asthma, uncomplicated: Secondary | ICD-10-CM | POA: Diagnosis not present

## 2021-12-15 DIAGNOSIS — I10 Essential (primary) hypertension: Secondary | ICD-10-CM | POA: Insufficient documentation

## 2021-12-15 MED ORDER — CYCLOBENZAPRINE HCL 10 MG PO TABS: 10.0000 mg | ORAL_TABLET | Freq: Two times a day (BID) | ORAL | 0 refills | Status: DC | PRN

## 2021-12-15 MED ORDER — PREDNISONE 10 MG PO TABS
40.0000 mg | ORAL_TABLET | Freq: Every day | ORAL | 0 refills | Status: AC
Start: 2021-12-15 — End: 2021-12-20

## 2021-12-15 NOTE — ED Triage Notes (Signed)
She c/o right hand and fingers paresthesias x 1 week. She further c/o some bilat. Shoulder muscle discomfort x 2 days. She denies injury and is in no distress.  ?

## 2021-12-15 NOTE — ED Provider Notes (Signed)
?[facility] ?Provider Note ? ? ?CSN: [id number] ?Arrival date & time: 12/15/21  1616 ? ?  ? ?History ? ?Chief Complaint  ?Patient presents with  ? Numbness  ? ? ?Jennifer Quinn is a 66 y.o. female. ? ?HPI ? ?  ? ?66 year old female with a history of hypertension, post menopausal bleeding, presents with concern for 1 week of right hand numbness, pain in right shoulder.  ? ?1 week of right hand numbness ?Tingling to radial side of hand, middle finger and index always feel this way, thumb sometimes, never pinky finger ?Sometimes better when hanging it down, will still be numb but tingling eases off, numbness right hand ?Pain in right arm, right shoulder, no numbness there ? ?2 tylenol and meloxicam, helps with pain but numbness is still there.  ?No chest pain, just had cardiac work, up ? ? ? ?Past Medical History:  ?Diagnosis Date  ? Asthma   ? Chest pain   ? Chronic constipation   ? Depression   ? Fibroid   ? GERD (gastroesophageal reflux disease)   ? occ  ? History of anemia   ? Hypertension   ? PMB (postmenopausal bleeding)   ? Vitamin D deficiency   ? Wears contact lenses   ? Wears partial dentures   ? upper and lower  ?  ?Home Medications ?Prior to Admission medications   ?Medication Sig Start Date End Date Taking? Authorizing Provider  ?cyclobenzaprine (FLEXERIL) 10 MG tablet Take 1 tablet (10 mg total) by mouth 2 (two) times daily as needed for muscle spasms. 12/15/21  Yes Gareth Morgan, MD  ?predniSONE (DELTASONE) 10 MG tablet Take 4 tablets (40 mg total) by mouth daily for 5 days. 12/15/21 12/20/21 Yes Gareth Morgan, MD  ?acetaminophen (TYLENOL) 500 MG tablet Take 500 mg by mouth every 6 (six) hours as needed.    [provider]  ?buPROPion (WELLBUTRIN) 100 MG tablet TAKE 1 TABLET BY MOUTH EVERY DAY 09/13/21   Plotnikov, Evie Lacks, MD  ?Cholecalciferol (VITAMIN D PO) Take 1,000 Units by mouth daily.     [provider]  ?diclofenac Sodium (VOLTAREN) 1 % GEL Apply  1 application topically 4 (four) times daily. 10/08/21   [provider]  ?Fish Oil OIL Take 2,000 mg by mouth daily.    [provider]  ?hydrochlorothiazide (MICROZIDE) 12.5 MG capsule TAKE 1 CAPSULE (12.5 MG TOTAL) BY MOUTH 2 (TWO) TIMES DAILY. 09/13/21   Plotnikov, Evie Lacks, MD  ?ibuprofen (ADVIL) 200 MG tablet Take 200 mg by mouth every 6 (six) hours as needed.    [provider]  ?metFORMIN (GLUCOPHAGE) 500 MG tablet Take 1 tablet (500 mg total) by mouth daily with breakfast. 08/27/20   Plotnikov, Evie Lacks, MD  ?omeprazole (PRILOSEC) 20 MG capsule Take 20 mg by mouth daily. ?Patient not taking: Reported on 11/29/2021 08/09/21   [provider]  ?   ? ?Allergies    ?Patient has no known allergies.   ? ?Review of Systems   ?Review of Systems ? ?Physical Exam ?Updated Vital Signs ?BP (!) 158/87 (BP Location: Left Arm)   Pulse 62   Temp 98.3 ?F (36.8 ?C) (Oral)   Resp 16   SpO2 98%  ?Physical Exam ?Vitals and nursing note reviewed.  ?Constitutional:   ?   General: She is not in acute distress. ?   Appearance: She is well-developed. She is not diaphoretic.  ?HENT:  ?   Head: Normocephalic and atraumatic.  ?Eyes:  ?  General: No visual field deficit. ?   Conjunctiva/sclera: Conjunctivae normal.  ?Cardiovascular:  ?   Rate and Rhythm: Normal rate and regular rhythm.  ?   Heart sounds: Normal heart sounds. No murmur heard. ?  No friction rub. No gallop.  ?Pulmonary:  ?   [facility]: Pulmonary effort is normal. No respiratory distress.  ?   Breath sounds: Normal breath sounds. No wheezing or rales.  ?Abdominal:  ?   General: There is no distension.  ?   Palpations: Abdomen is soft.  ?   Tenderness: There is no abdominal tenderness. There is no guarding.  ?Musculoskeletal:     ?   General: Tenderness (right upper arm) present.  ?   Cervical back: Normal range of motion.  ?   Comments: Painful ROM right shoulder, pain with abduction, empty can, reaching behind back. ? ?Normal strength/no  drift, pain with abduction, normal wrist extension/finger abduction, opponens. Reports altered sensation middle finger. Normal sensation to thumb, pinky  ?Skin: ?   General: Skin is warm and dry.  ?   Findings: No erythema or rash.  ?Neurological:  ?   Mental Status: She is alert and oriented to person, place, and time.  ?   GCS: GCS eye subscore is 4. GCS verbal subscore is 5. GCS motor subscore is 6.  ?   Cranial Nerves: Cranial nerves 2-12 are intact. No cranial nerve deficit, dysarthria or facial asymmetry.  ?   Sensory: Sensation is intact. No sensory deficit.  ?   Motor: Motor function is intact. No weakness (no pronator drift, normal strength (with exception of pain with shoulder abduction)) or pronator drift.  ?   Coordination: Coordination normal. Finger-Nose-Finger Test normal.  ?   Gait: Gait is intact.  ? ? ?ED Results / Procedures / Treatments   ?Labs ?(all labs ordered are listed, but only abnormal results are displayed) ?Labs Reviewed - No data to display ? ?EKG ?EKG Interpretation ? ?Date/Time:  Wednesday December 15 2021 17:03:13 EDT ?Ventricular Rate:  67 ?PR Interval:  166 ?QRS Duration: 94 ?QT Interval:  387 ?QTC Calculation: 409 ?R Axis:   34 ?Text Interpretation: Sinus rhythm No significant change since last tracing Confirmed by Gareth Morgan [phone]) on 12/15/2021 5:23:08 PM ? ?Radiology ?No results found. ? ?Procedures ?Procedures  ? ? ?Medications Ordered in ED ?Medications - No data to display ? ?ED Course/ Medical Decision Making/ A&P ?  ?                        ?Medical Decision Making ?Risk ?Prescription drug management. ? ? ?66 year old female with a history of hypertension, post menopausal bleeding, presents with concern for 1 week of right hand numbness, pain in right shoulder. ? ?Differential diagnosis includes CVA, cervical radiculopathy, epidural abscess, ACS, acute arterial thrombus, DVT, cellulitis, septic arthritis, carpal tunnel, rotator cuff pathology. ? ?EKG without significant  findings.  Denies chest pain, shortness of breath, symptoms are worse with movement of the right shoulder, and clinically have low suspicion for ACS.  She has good pulses bilaterally, low suspicion for aortic dissection or acute arterial thrombus.  No asymmetric swelling to suggest DVT. ? ?Has normal neurologic exam with exception of numbness to the right middle finger.  She has significant pain with movement consistent with likely rotator cuff pathology, but does not show significant weakness to suggest CVA, or need for emergent surgery for cervical pathology. ? ?Is possible she has carpal tunnel and rotator cuff  pathology, however feel most likely etiology of her radicular pain to her shoulder, arm, and finger numbness is a cervical radiculopathy.  Discussed possibility of transfer to [facility] for MRI, however given no sign of significant weakness or cord compression feel outpatient evaluation is appropriate.  Discussed decision making with Jennifer Quinn in detail.  Plan on trial of steroids, Flexeril, and outpatient follow-up with her primary care physician and sports medicine.  Given resplint to wear at night for possible carpal tunnel, shoulder sling to wear for comfort with plan for range of motion exercises. ? ? ? ? ? ? ? ?Final Clinical Impression(s) / ED Diagnoses ?Final diagnoses:  ?Cervical radiculopathy  ?Acute pain of right shoulder  ?Numbness and tingling in right hand  ? ? ?Rx / DC Orders ?ED Discharge Orders   ? ?      Ordered  ?  predniSONE (DELTASONE) 10 MG tablet  Daily       ? 12/15/21 1811  ?  cyclobenzaprine (FLEXERIL) 10 MG tablet  2 times daily PRN       ? 12/15/21 1811  ? ?  ?  ? ?  ? ? ?  ?Gareth Morgan, MD ?12/16/21 0016 ? ?

## 2021-12-16 ENCOUNTER — Ambulatory Visit: Payer: Self-pay

## 2021-12-23 ENCOUNTER — Ambulatory Visit: Payer: Self-pay

## 2021-12-30 ENCOUNTER — Ambulatory Visit: Payer: PRIVATE HEALTH INSURANCE | Admitting: Family

## 2022-01-03 ENCOUNTER — Ambulatory Visit: Payer: PRIVATE HEALTH INSURANCE | Admitting: Family Medicine

## 2022-01-10 ENCOUNTER — Ambulatory Visit: Payer: Self-pay

## 2022-01-10 ENCOUNTER — Ambulatory Visit (INDEPENDENT_AMBULATORY_CARE_PROVIDER_SITE_OTHER): Payer: No Typology Code available for payment source | Admitting: Family Medicine

## 2022-01-10 VITALS — BP 138/70 | Ht 62.0 in | Wt 238.0 lb

## 2022-01-10 DIAGNOSIS — M25521 Pain in right elbow: Secondary | ICD-10-CM

## 2022-01-10 DIAGNOSIS — G5601 Carpal tunnel syndrome, right upper limb: Secondary | ICD-10-CM

## 2022-01-10 MED ORDER — PREDNISONE 10 MG PO TABS: ORAL_TABLET | ORAL | 0 refills | Status: DC

## 2022-01-10 NOTE — Progress Notes (Unsigned)
PCP: Plotnikov, Evie Lacks, MD  Subjective:   HPI: Patient is a 66 y.o. female here for right hand pain/numbness.  Symptoms started 3 months ago, at which point she thought maybe it was related to stiffness in her shoulder. She was waking up in the night with numbness sensation of her right hand and needing to shake her hand to relieve it. Her symptoms have gotten progressively worse and now her right hand is "numb" throughout most of the day. The worst numbness is in her middle finger. There is no numbness in her pinky finger.  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 She is right hand dominant.  Of note, she was seen in the ED ~4 weeks ago. They felt her symptoms may be related to cervical radiculopathy. She was prescribed a course of Prednisone '20mg'$  daily x5 days which did not provide significant relief. She was also given a splint to wear at night for possible carpal tunnel, although she has only worn it occasionally.   Past Medical History:  Diagnosis Date   Asthma    Chest pain    Chronic constipation    Depression    Fibroid    GERD (gastroesophageal reflux disease)    occ   History of anemia    Hypertension    PMB (postmenopausal bleeding)    Vitamin D deficiency    Wears contact lenses    Wears partial dentures    upper and lower    Current Outpatient Medications on File Prior to Visit  Medication Sig Dispense Refill   acetaminophen (TYLENOL) 500 MG tablet Take 500 mg by mouth every 6 (six) hours as needed.     buPROPion (WELLBUTRIN) 100 MG tablet TAKE 1 TABLET BY MOUTH EVERY DAY 30 tablet 11   Cholecalciferol (VITAMIN D PO) Take 1,000 Units by mouth daily.      cyclobenzaprine (FLEXERIL) 10 MG tablet Take 1 tablet (10 mg total) by mouth 2 (two) times daily as needed for muscle spasms. 20 tablet 0   diclofenac Sodium (VOLTAREN) 1 % GEL Apply 1 application topically 4 (four) times daily.     Fish Oil OIL Take 2,000 mg by mouth daily.      hydrochlorothiazide (MICROZIDE) 12.5 MG capsule TAKE 1 CAPSULE (12.5 MG TOTAL) BY MOUTH 2 (TWO) TIMES DAILY. 60 capsule 11   ibuprofen (ADVIL) 200 MG tablet Take 200 mg by mouth every 6 (six) hours as needed.     metFORMIN (GLUCOPHAGE) 500 MG tablet Take 1 tablet (500 mg total) by mouth daily with breakfast. 90 tablet 3   omeprazole (PRILOSEC) 20 MG capsule Take 20 mg by mouth daily. (Patient not taking: Reported on 11/29/2021)     No current facility-administered medications on file prior to visit.    Past Surgical History:  Procedure Laterality Date   COLONOSCOPY  2007, 01/25/2011. 05/25/16   DILATATION & CURETTAGE/HYSTEROSCOPY WITH MYOSURE N/A 07/23/2018   Procedure: DILATATION & CURETTAGE/HYSTEROSCOPY WITH MYOSURE;  Surgeon: Salvadore Dom, MD;  Location: [facility];  Service: Gynecology;  Laterality: N/A;  Follow 1st case. Use [facility] virtual OR in place of [facility] block time.    No Known Allergies  BP 138/70   Ht '5\' 2"'$  (1.575 m)   Wt 238 lb (108 kg)   BMI 43.53 kg/m       Objective:  Physical Exam:  Gen: NAD, comfortable in exam room  Right Wrist/Hand: inspection is normal without deformity, swelling,  or skin changes. No tenderness to palpation throughout hand or wrist. FROM of wrist. Normal strength finger abduction, extension, thumb opposition. Negative Phalen's and Tinel's sign. Sensation intact to light touch throughout hand.   Limited MSK u/s right wrist:  Median nerve volume 0.11cm2  Assessment & Plan:  1. Right Hand Paresthesias-- presentation fairly classic for carpal tunnel syndrome with numbness sensation in median nerve distribution and +Flick sign. Ultrasound performed today shows mildly enlarged median nerve area (0.11cm2). Anticipate improvement with conservative management. Encouraged patient to use wrist splint every night as well as daytime when able. Will give Prednisone dose pack. If no improvement in several weeks could consider steroid injection  vs nerve conduction studies at that time.  Alcus Dad, MD PGY-2 [place]

## 2022-01-10 NOTE — Patient Instructions (Signed)
You have carpal tunnel syndrome. Wear the wrist brace at nighttime and as often as possible during the day - the nighttime splinting is critical for this to go away. Take prednisone dose pack as directed. Corticosteroid injection is a consideration to help with pain and inflammation - call us in 2-3 weeks if you're not improving as expected and we will go ahead with this under ultrasound If not improving, will consider nerve conduction studies to assess severity. Follow up with me in 6 weeks if you're improving.

## 2022-01-11 ENCOUNTER — Encounter: Payer: Self-pay | Admitting: Family Medicine

## 2022-01-25 ENCOUNTER — Ambulatory Visit: Payer: No Typology Code available for payment source | Admitting: Nurse Practitioner

## 2022-01-25 ENCOUNTER — Encounter: Payer: Self-pay | Admitting: Nurse Practitioner

## 2022-01-25 VITALS — BP 160/89 | HR 69 | Resp 18

## 2022-01-25 DIAGNOSIS — J069 Acute upper respiratory infection, unspecified: Secondary | ICD-10-CM

## 2022-01-25 MED ORDER — ALBUTEROL SULFATE HFA 108 (90 BASE) MCG/ACT IN AERS: 2.0000 | INHALATION_SPRAY | Freq: Four times a day (QID) | RESPIRATORY_TRACT | 2 refills | Status: DC | PRN

## 2022-01-25 MED ORDER — PROMETHAZINE-DM 6.25-15 MG/5ML PO SYRP: 5.0000 mL | ORAL_SOLUTION | Freq: Four times a day (QID) | ORAL | 0 refills | Status: DC | PRN

## 2022-01-25 NOTE — Progress Notes (Signed)
Acute Office Visit  Subjective:     Patient ID: Jennifer Quinn, female    DOB: April 28, 1956, 66 y.o.   MRN: [id number]  Chief Complaint  Patient presents with   Cough   Patient presents today for c/o cough x 4 days. Patient reports cough with green sputum production. No headaches, fever, or chills. Denies GI symptoms. Has some myalgias but this is not new for her. She does endorse some sinus pressure.  Reports hx of asthma has some slightly SOB when laying down. She does have an albuterol inhaler at home but reports this is expired.   She reports testing negative for covid at home.  Patient is a Therapist, sports.   Review of Systems  Constitutional:  Negative for chills, fever and malaise/fatigue.  HENT:  Positive for congestion and sinus pain. Negative for sore throat.   Respiratory:  Positive for cough and wheezing.   Cardiovascular:  Negative for chest pain.  Gastrointestinal: Negative.   Musculoskeletal:  Negative for myalgias.  Neurological:  Negative for dizziness and headaches.       Objective:    BP (!) 160/89 (BP Location: Left Arm, Patient Position: Sitting)   Pulse 69   Resp 18   SpO2 97%  BP Readings from Last 3 Encounters:  01/25/22 (!) 160/89  01/10/22 138/70  12/15/21 (!) 158/87    Physical Exam Constitutional:      General: She is not in acute distress. HENT:     Head: Normocephalic.     Nose: Nose normal.     Mouth/Throat:     Mouth: Mucous membranes are moist.     Pharynx: No oropharyngeal exudate or posterior oropharyngeal erythema.  Eyes:     Pupils: Pupils are equal, round, and reactive to light.  Cardiovascular:     Rate and Rhythm: Normal rate and regular rhythm.     Heart sounds: Normal heart sounds.  Pulmonary:     [place]: Pulmonary effort is normal. No respiratory distress.     Breath sounds: Normal breath sounds. No wheezing.  Musculoskeletal:        General: Normal range of motion.  Skin:    General: Skin is warm.  Neurological:     General:  No focal deficit present.     Mental Status: She is alert and oriented to person, place, and time.  Psychiatric:        Mood and Affect: Mood normal.        Behavior: Behavior normal.    No results found for any visits on 01/25/22.      Assessment & Plan:   Problem List Items Addressed This Visit   None Visit Diagnoses     Viral upper respiratory tract infection    -  Primary   Relevant Medications   albuterol (VENTOLIN HFA) 108 (90 Base) MCG/ACT inhaler   promethazine-dextromethorphan (PROMETHAZINE-DM) 6.25-15 MG/5ML syrup  Discussed supportive care measures at home including use of mucinex, cough medications, and steam. Due to hx of asthma, will refill albuterol inhaler. Discussed following up for worsening symptoms. Can consider antibiotics if no improvement in 1 week.  BP is elevated today. Likely transient related to viral illness. Patient encouraged to monitor BP at home. Follow-up if BP remains elevated.      Meds ordered this encounter  Medications   albuterol (VENTOLIN HFA) 108 (90 Base) MCG/ACT inhaler    Sig: Inhale 2 puffs into the lungs every 6 (six) hours as needed for wheezing or shortness of breath.  Dispense:  8 g    Refill:  2   promethazine-dextromethorphan (PROMETHAZINE-DM) 6.25-15 MG/5ML syrup    Sig: Take 5 mLs by mouth 4 (four) times daily as needed for cough.    Dispense:  118 mL    Refill:  0    Lurena Joiner, NP

## 2022-01-27 ENCOUNTER — Other Ambulatory Visit: Payer: Self-pay | Admitting: Nurse Practitioner

## 2022-01-27 DIAGNOSIS — J0100 Acute maxillary sinusitis, unspecified: Secondary | ICD-10-CM

## 2022-01-27 MED ORDER — ONDANSETRON 8 MG PO TBDP: 8.0000 mg | ORAL_TABLET | Freq: Three times a day (TID) | ORAL | 0 refills | Status: DC | PRN

## 2022-01-27 MED ORDER — AMOXICILLIN-POT CLAVULANATE 875-125 MG PO TABS: 1.0000 | ORAL_TABLET | Freq: Two times a day (BID) | ORAL | 0 refills | Status: AC

## 2022-01-27 NOTE — Progress Notes (Addendum)
Patient reports worsening symptoms of congestion and sinus pressure despite supportive therapies at home. She is participating in saline flushes, humidifier use, decongestant and cough medications around the clock. Due to worsening symptoms, will go ahead and treat with antibiotics.   Patient reports nausea with Augmentin use in the past. Will go ahead and send zofran prn.

## 2022-01-27 NOTE — Addendum Note (Signed)
Addended by: Jonnie Kind F on: 01/27/2022 04:49 PM   Modules accepted: Orders

## 2022-02-04 ENCOUNTER — Other Ambulatory Visit: Payer: Self-pay | Admitting: Internal Medicine

## 2022-02-04 DIAGNOSIS — F3289 Other specified depressive episodes: Secondary | ICD-10-CM

## 2022-02-14 ENCOUNTER — Encounter: Payer: Self-pay | Admitting: Internal Medicine

## 2022-02-14 DIAGNOSIS — R7303 Prediabetes: Secondary | ICD-10-CM

## 2022-02-14 DIAGNOSIS — J069 Acute upper respiratory infection, unspecified: Secondary | ICD-10-CM

## 2022-02-15 MED ORDER — ALBUTEROL SULFATE HFA 108 (90 BASE) MCG/ACT IN AERS: 2.0000 | INHALATION_SPRAY | Freq: Four times a day (QID) | RESPIRATORY_TRACT | 2 refills | Status: DC | PRN

## 2022-02-15 MED ORDER — METFORMIN HCL 500 MG PO TABS: 500.0000 mg | ORAL_TABLET | Freq: Every day | ORAL | 1 refills | Status: DC

## 2022-03-30 ENCOUNTER — Encounter (INDEPENDENT_AMBULATORY_CARE_PROVIDER_SITE_OTHER): Payer: Self-pay

## 2022-04-05 ENCOUNTER — Emergency Department (HOSPITAL_BASED_OUTPATIENT_CLINIC_OR_DEPARTMENT_OTHER)
Admission: EM | Admit: 2022-04-05 | Discharge: 2022-04-05 | Disposition: A | Discharge status: Home / Self Care | Payer: No Typology Code available for payment source | Attending: Emergency Medicine | Admitting: Emergency Medicine

## 2022-04-05 ENCOUNTER — Encounter (HOSPITAL_BASED_OUTPATIENT_CLINIC_OR_DEPARTMENT_OTHER): Payer: Self-pay | Admitting: Emergency Medicine

## 2022-04-05 ENCOUNTER — Other Ambulatory Visit: Payer: Self-pay

## 2022-04-05 ENCOUNTER — Emergency Department (HOSPITAL_BASED_OUTPATIENT_CLINIC_OR_DEPARTMENT_OTHER): Payer: No Typology Code available for payment source | Admitting: Radiology

## 2022-04-05 DIAGNOSIS — J45909 Unspecified asthma, uncomplicated: Secondary | ICD-10-CM | POA: Insufficient documentation

## 2022-04-05 DIAGNOSIS — R072 Precordial pain: Secondary | ICD-10-CM | POA: Diagnosis not present

## 2022-04-05 DIAGNOSIS — R0602 Shortness of breath: Secondary | ICD-10-CM | POA: Insufficient documentation

## 2022-04-05 DIAGNOSIS — Z7951 Long term (current) use of inhaled steroids: Secondary | ICD-10-CM | POA: Diagnosis not present

## 2022-04-05 DIAGNOSIS — R110 Nausea: Secondary | ICD-10-CM | POA: Insufficient documentation

## 2022-04-05 DIAGNOSIS — R61 Generalized hyperhidrosis: Secondary | ICD-10-CM | POA: Diagnosis not present

## 2022-04-05 DIAGNOSIS — I10 Essential (primary) hypertension: Secondary | ICD-10-CM | POA: Insufficient documentation

## 2022-04-05 DIAGNOSIS — R0789 Other chest pain: Secondary | ICD-10-CM

## 2022-04-05 LAB — CBC
HCT: 38 % (ref 36.0–46.0)
Hemoglobin: 12.7 g/dL (ref 12.0–15.0)
MCH: 27.4 pg (ref 26.0–34.0)
MCHC: 33.4 g/dL (ref 30.0–36.0)
MCV: 82.1 fL (ref 80.0–100.0)
Platelets: 276 10*3/uL (ref 150–400)
RBC: 4.63 MIL/uL (ref 3.87–5.11)
RDW: 15.3 % (ref 11.5–15.5)
WBC: 5.1 10*3/uL (ref 4.0–10.5)
nRBC: 0 % (ref 0.0–0.2)

## 2022-04-05 LAB — BASIC METABOLIC PANEL
Anion gap: 10 (ref 5–15)
BUN: 16 mg/dL (ref 8–23)
CO2: 28 mmol/L (ref 22–32)
Calcium: 9.6 mg/dL (ref 8.9–10.3)
Chloride: 101 mmol/L (ref 98–111)
Creatinine, Ser: 1.01 mg/dL — ABNORMAL HIGH (ref 0.44–1.00)
GFR, Estimated: >60 mL/min (ref >60)
Glucose, Bld: 101 mg/dL — ABNORMAL HIGH (ref 70–99)
Potassium: 3.9 mmol/L (ref 3.5–5.1)
Sodium: 139 mmol/L (ref 135–145)

## 2022-04-05 LAB — D-DIMER, QUANTITATIVE: D-Dimer, Quant: 0.49 ug/mL-FEU (ref 0.00–0.50)

## 2022-04-05 LAB — TROPONIN I (HIGH SENSITIVITY): Troponin I (High Sensitivity): 4 ng/L (ref <18)

## 2022-04-05 NOTE — ED Provider Notes (Signed)
I provided a substantive portion of the care of this patient.  I personally performed the entirety of the medical decision making for this encounter.  EKG Interpretation  Date/Time:  Tuesday April 05 2022 11:11:13 EDT Ventricular Rate:  68 PR Interval:  160 QRS Duration: 88 QT Interval:  370 QTC Calculation: 393 R Axis:   -3 Text Interpretation: Normal sinus rhythm Normal ECG When compared with ECG of 15-Dec-2021 17:03, PREVIOUS ECG IS PRESENT Confirmed by Lacretia Leigh ([id number]) on 04/05/2022 1:18:35 PM   The patient presented today complaining of atypical chest pain.  Dull intermittent only lasting for several minutes to seconds.  Troponin here is negative.  D-dimer also negative.  Suspect noncardiac etiology and will discharge   Lacretia Leigh, MD 04/05/22 1320

## 2022-04-05 NOTE — Discharge Instructions (Addendum)
You have been evaluated for your symptoms.  Fortunately no concerning findings were noted during this ER visit.  You may follow-up closely with your primary care doctor for further care.  You may consider taking over-the-counter Prilosec and Pepcid as this could be heartburn.  Return if you have any concern.

## 2022-04-05 NOTE — ED Triage Notes (Signed)
Pt arrives to ED with c/o chest pain. This pain started yesterday and described as intermittent and dull. The CP is centralized and in epigastric abdominal region. Pt has tried OTC medication for acid reflux w/o relief.

## 2022-04-05 NOTE — ED Provider Notes (Signed)
[facility] EMERGENCY DEPT Provider Note   CSN: [id number] Arrival date & time: 04/05/22  1058     History  Chief Complaint  Patient presents with   Chest Pain    Jennifer Quinn is a 66 y.o. female.  The history is provided by the patient and medical records. No language interpreter was used.  Chest Pain    66 year old female significant history of hypertension, asthma, GERD, anemia presenting with complaints of chest pain.  Patient report yesterday as she was driving home she developed pain in her chest.  She described pain as a uncomfortable sensation to her mid chest with some associated nausea, shortness of breath, and diaphoretic.  Symptoms lasting for a few minutes and she thought it was a heart attack.  She went home, took Tylenol, as well as try Mylanta and some acid reducer medication without significant improvement.  She went to sleep, pain went away but and woke up in the morning with similar symptoms however this time she noticed pain worse when she takes a deep breath.  Pain has been waxing waning and present earlier today as well.  States now pain only presents when she breathes.  She does endorse some shortness of breath with ambulation.  She denies any history of PE or DVT no recent surgery prolonged bedrest active cancer leg swelling or calf pain.  She is not on any oral hormone.  She mention she had a coronary CT scan done several months ago which was normal.  She denies tobacco or alcohol use and denies any other significant cardiac history.  Home Medications Prior to Admission medications   Medication Sig Start Date End Date Taking? Authorizing Provider  acetaminophen (TYLENOL) 500 MG tablet Take 500 mg by mouth every 6 (six) hours as needed.    [provider]  albuterol (VENTOLIN HFA) 108 (90 Base) MCG/ACT inhaler Inhale 2 puffs into the lungs every 6 (six) hours as needed for wheezing or shortness of breath. 02/15/22   Plotnikov, Evie Lacks, MD   buPROPion (WELLBUTRIN) 100 MG tablet TAKE 1 TABLET BY MOUTH EVERY DAY 09/13/21   Plotnikov, Evie Lacks, MD  Cholecalciferol (VITAMIN D PO) Take 1,000 Units by mouth daily.     [provider]  cyclobenzaprine (FLEXERIL) 10 MG tablet Take 1 tablet (10 mg total) by mouth 2 (two) times daily as needed for muscle spasms. 12/15/21   Gareth Morgan, MD  diclofenac Sodium (VOLTAREN) 1 % GEL Apply 1 application topically 4 (four) times daily. 10/08/21   [provider]  Fish Oil OIL Take 2,000 mg by mouth daily.    [provider]  hydrochlorothiazide (MICROZIDE) 12.5 MG capsule TAKE 1 CAPSULE (12.5 MG TOTAL) BY MOUTH 2 (TWO) TIMES DAILY. 09/13/21   Plotnikov, Evie Lacks, MD  ibuprofen (ADVIL) 200 MG tablet Take 200 mg by mouth every 6 (six) hours as needed.    [provider]  metFORMIN (GLUCOPHAGE) 500 MG tablet Take 1 tablet (500 mg total) by mouth daily with breakfast. 02/15/22   Plotnikov, Evie Lacks, MD  ondansetron (ZOFRAN-ODT) 8 MG disintegrating tablet Take 1 tablet (8 mg total) by mouth every 8 (eight) hours as needed for nausea or vomiting. 01/27/22   Lurena Joiner, NP  promethazine-dextromethorphan (PROMETHAZINE-DM) 6.25-15 MG/5ML syrup Take 5 mLs by mouth 4 (four) times daily as needed for cough. 01/25/22   Lurena Joiner, NP      Allergies    Patient has no known allergies.    Review  of Systems   Review of Systems  Cardiovascular:  Positive for chest pain.  All other systems reviewed and are negative.   Physical Exam Updated Vital Signs BP (!) 153/84 (BP Location: Right Arm)   Pulse 66   Temp 98.2 F (36.8 C)   Resp 14   Ht '5\' 2"'$  (1.575 m)   Wt 108.9 kg   SpO2 96%   BMI 43.90 kg/m  Physical Exam Vitals and nursing note reviewed.  Constitutional:      General: She is not in acute distress.    Appearance: She is well-developed.  HENT:     Head: Atraumatic.  Eyes:     Conjunctiva/sclera: Conjunctivae normal.  Cardiovascular:      Rate and Rhythm: Normal rate and regular rhythm.     Pulses: Normal pulses.     Heart sounds: Normal heart sounds.  Pulmonary:     Effort: Pulmonary effort is normal.     Breath sounds: No wheezing, rhonchi or rales.  Abdominal:     Palpations: Abdomen is soft.     Tenderness: There is no abdominal tenderness.  Musculoskeletal:     Cervical back: Neck supple.     Right lower leg: No edema.     Left lower leg: No edema.  Skin:    Findings: No rash.  Neurological:     Mental Status: She is alert.  Psychiatric:        Mood and Affect: Mood normal.     ED Results / Procedures / Treatments   Labs (all labs ordered are listed, but only abnormal results are displayed) Labs Reviewed  BASIC METABOLIC PANEL - Abnormal; Notable for the following components:      Result Value   Glucose, Bld 101 (*)    Creatinine, Ser 1.01 (*)    All other components within normal limits  CBC  D-DIMER, QUANTITATIVE  TROPONIN I (HIGH SENSITIVITY)    EKG None  Date: 04/05/2022  Rate: 68  Rhythm: normal sinus rhythm  QRS Axis: normal  Intervals: normal  ST/T Wave abnormalities: normal  Conduction Disutrbances: none  Narrative Interpretation:   Old EKG Reviewed: No significant changes noted    Radiology DG Chest 2 View  Result Date: 04/05/2022 CLINICAL DATA:  Chest pain beginning last night EXAM: CHEST - 2 VIEW COMPARISON:  CT 10/13/2021 FINDINGS: The heart size and mediastinal contours are within normal limits. Both lungs are clear. The visualized skeletal structures are unremarkable. IMPRESSION: No active cardiopulmonary disease. Electronically Signed   By: Nelson Chimes M.D.   On: 04/05/2022 11:34    Procedures Procedures    Medications Ordered in ED Medications - No data to display  ED Course/ Medical Decision Making/ A&P                           Medical Decision Making Amount and/or Complexity of Data Reviewed Labs: ordered. Radiology: ordered.   BP (!) 153/84 (BP Location:  Right Arm)   Pulse 66   Temp 98.2 F (36.8 C)   Resp 14   Ht '5\' 2"'$  (1.575 m)   Wt 108.9 kg   SpO2 96%   BMI 43.90 kg/m   12:08 PM This is a 66 year old female significant history of hypertension, asthma, GERD, anemia, who presented today with complaints of chest pain.  Patient report noticing pain to her mid chest yesterday afternoon while she was driving home.  She endorsed feeling nauseous and diaphoretic for a  few minutes.  Which concerns her and she took some over-the-counter medication including acid blocker as well as Tylenol.  She went to sleep and woke up in the morning with similar symptoms.  States now the pain is present when she takes a deep breath.  She endorses shortness of breath as well.  She mentioned undergoing a coronary CT scan several months prior that was normal.  She does not have any significant history of PE or DVT and no significant risk factors except for age.  Hear score is 3 low risk of Mace, will obtain delta troponin.  Since I am unable to use PERC criteria to rule out PE, I will obtain D-dimer.  At this time patient is doing fine, and does not appears to be in any acute discomfort. Denies cough or hemoptysis  Labs, EKG, imaging independently viewed interpreted by me and I agree with radiology interpretation.  Chest x-ray without any acute finding, labs are reassuring, initial troponin is normal, EKG is without any concerning arrhythmia or ischemic changes.  1:45 PM Fortunately D-dimer is negative therefore suspicion for PE is quite low.  I discussed care with attending Dr. Zenia Resides who agrees.  Patient does not need delta troponin as her pain has been more than 6 hours and initial troponin is normal.  At this time, recommend patient to follow-up outpatient for further care.  I have discussed return precaution.  Patient is overall stable for discharge.  I have considered admission but felt patient is stable to be discharged home especially considering that patient has a  normal coronary CT scan several weeks prior.   This patient presents to the ED for concern of chest pain, this involves an extensive number of treatment options, and is a complaint that carries with it a high risk of complications and morbidity.  The differential diagnosis includes acs, pe, gerd, gastritis, ptx, pna, costochondritis  Co morbidities that complicate the patient evaluation HTN  Asthma  GERD  anemia Additional history obtained:  Additional history obtained from husband External records from outside source obtained and reviewed including EMR including labs and imaging  Lab Tests:  I Ordered, and personally interpreted labs.  The pertinent results include:  as above  Imaging Studies ordered:  I ordered imaging studies including CXR I independently visualized and interpreted imaging which showed unremarkable I agree with the radiologist interpretation  Cardiac Monitoring:  The patient was maintained on a cardiac monitor.  I personally viewed and interpreted the cardiac monitored which showed an underlying rhythm of: NSR  Medicines ordered and prescription drug management:   I have reviewed the patients home medicines and have made adjustments as needed  Test Considered: as above  Critical Interventions: none  Consultations Obtained:  I requested consultation with the attending Dr. Zenia Resides,  and discussed lab and imaging findings as well as pertinent plan - they recommend: outpt f/u  Problem List / ED Course: chest pain  Reevaluation:  After the interventions noted above, I reevaluated the patient and found that they have :resolved  Social Determinants of Health: smoking  Dispostion:  After consideration of the diagnostic results and the patients response to treatment, I feel that the patent would benefit from outpt f/u.         Final Clinical Impression(s) / ED Diagnoses Final diagnoses:  Non-cardiac chest pain    Rx / DC Orders ED  Discharge Orders     None         Domenic Moras, PA-C 04/05/22 1351  Lacretia Leigh, MD 04/08/22 [phone]

## 2022-04-11 ENCOUNTER — Other Ambulatory Visit: Payer: Self-pay | Admitting: *Deleted

## 2022-04-11 DIAGNOSIS — F3289 Other specified depressive episodes: Secondary | ICD-10-CM

## 2022-04-11 MED ORDER — BUPROPION HCL 100 MG PO TABS: 100.0000 mg | ORAL_TABLET | Freq: Every day | ORAL | 5 refills | Status: DC

## 2022-04-28 ENCOUNTER — Ambulatory Visit: Payer: No Typology Code available for payment source | Admitting: Nurse Practitioner

## 2022-04-28 ENCOUNTER — Encounter: Payer: Self-pay | Admitting: Nurse Practitioner

## 2022-04-28 VITALS — BP 154/92 | HR 74

## 2022-04-28 DIAGNOSIS — I10 Essential (primary) hypertension: Secondary | ICD-10-CM

## 2022-04-28 DIAGNOSIS — R200 Anesthesia of skin: Secondary | ICD-10-CM

## 2022-04-28 MED ORDER — AMLODIPINE BESYLATE 2.5 MG PO TABS: 2.5000 mg | ORAL_TABLET | Freq: Every day | ORAL | 1 refills | Status: DC

## 2022-04-28 NOTE — Progress Notes (Deleted)
  Acute Office Visit  Subjective:     Patient ID: Jennifer Quinn, female    DOB: 04/05/1956, 66 y.o.   MRN: [id number]  No chief complaint on file.   HPI Patient presents today for c/o elevated BP readings.  Reports that her BP has been elevated over the past week. Reports normal reading with wrist BP cuff but states husbnad brpught and automatic BP cuff a week ago and her SBP readings have been 150s-180s.  She is prescribed 12.'5mg'$  HCTZ BID (but admitly does not take thisevery day). Reports she takes potassium with this as well but this still cause her to have muscular cramping. She has taken this ober the past 2-3 days due to elevated readings.   Also reports some numbness of bilateral hands prestn for several months.   ROS      Objective:    There were no vitals taken for this visit. {Vitals History (Optional):23777}  Physical Exam  No results found for any visits on 04/28/22.      Assessment & Plan:   Problem List Items Addressed This Visit   None   No orders of the defined types were placed in this encounter.   No follow-ups on file.  Lurena Joiner, NP

## 2022-04-28 NOTE — Progress Notes (Signed)
Acute Office Visit  Subjective:     Patient ID: Jennifer Quinn, female    DOB: [birth year], 66 y.o.   MRN: [id number]  Chief Complaint  Patient presents with   Hypertension    Patient presents today for c/o of elevated blood pressure readings.  Reports she regularly checks her BP at home with a wrist cuff which showed SBP was normally 120-130s. However, her husband obtained an automatic blood pressure cuff 2-3 week ago and she noticed readings were high. Reports SBP has remained around 150s-180s. She reports wrist BP cuff continue to show normal blood pressure readings.  In the past, she did have elevated readings in clinical setting but she attributed this to white coat syndrome. She has also endorsed occasional dizzy spells but denies chest pain or SOB.  For HTN, she has been prescribed HCTZ twice a day. However, she only takes this as needed because she reports this causes muscle cramps. Reports that when she takes this 2-3 days in a row, cramping become significant. She also endorses taking potassium along with this as well. She is requesting change in BP medication due to these side effects. She has taken HCTZ and potassium over the past 2 days due to elevated readings.   She also c/o bilateral numbness in fingers. This started 6 months ago. Feels tightness in hands. She was treated with prednisone and ultrasound showed mildly enlarged median nerve. PCP recommended conservative treatment and possible steroid injection vs nerve conduction studies if symptoms persist. However, patient is interested in checking vit b12 levels today.  Review of Systems  Respiratory:  Negative for cough and shortness of breath.   Cardiovascular:  Negative for chest pain, palpitations and leg swelling.  Neurological:  Positive for dizziness (sometimes). Negative for weakness and headaches (occassional).        Objective:    BP (!) 154/92   Pulse 74   SpO2 97%    Physical Exam Constitutional:       General: She is not in acute distress. HENT:     Head: Normocephalic.  Cardiovascular:     Rate and Rhythm: Normal rate and regular rhythm.     Pulses: Normal pulses.  Pulmonary:     Effort: Pulmonary effort is normal.  Musculoskeletal:        General: Normal range of motion.     Cervical back: Normal range of motion.     Right lower leg: No edema.     Left lower leg: No edema.  Skin:    General: Skin is warm.  Neurological:     General: No focal deficit present.     Mental Status: She is alert and oriented to person, place, and time.     Motor: No weakness.     Comments: Negative phalen's test.   Psychiatric:        Mood and Affect: Mood normal.        Behavior: Behavior normal.     No results found for any visits on 04/28/22.      Assessment & Plan:   Problem List Items Addressed This Visit   None Visit Diagnoses     Essential hypertension    -  Primary BP is uncontrolled today.  Due to ongoing muscle cramps on HCTZ, will plan to stop and check BMP.  Discussed medication options and patient would like to start amlodipine.  Will start at low dose and titrate up if needed. Will check BP in 2-3 weeks. Patient will continue  checking BP at home.  Also encouraged low sodium diet   Relevant Medications   amLODipine (NORVASC) 2.5 MG tablet   Other Relevant Orders   Basic Metabolic Panel   Numbness of fingers of both hands     Will check Vit b12, if wnl can follow-up with PCP for further management   Relevant Orders   Vitamin B12       Meds ordered this encounter  Medications   amLODipine (NORVASC) 2.5 MG tablet    Sig: Take 1 tablet (2.5 mg total) by mouth daily.    Dispense:  30 tablet    Refill:  1      Lurena Joiner, NP

## 2022-04-29 LAB — VITAMIN B12: Vitamin B-12: 1080 pg/mL (ref 200–1100)

## 2022-04-29 LAB — BASIC METABOLIC PANEL
BUN/Creatinine Ratio: 17 (calc) (ref 6–22)
BUN: 19 mg/dL (ref 7–25)
CO2: 27 mmol/L (ref 20–32)
Calcium: 9.6 mg/dL (ref 8.6–10.4)
Chloride: 102 mmol/L (ref 98–110)
Creat: 1.09 mg/dL — ABNORMAL HIGH (ref 0.50–1.05)
Glucose, Bld: 80 mg/dL (ref 65–99)
Potassium: 4 mmol/L (ref 3.5–5.3)
Sodium: 140 mmol/L (ref 135–146)

## 2022-05-05 ENCOUNTER — Ambulatory Visit: Payer: No Typology Code available for payment source | Admitting: Nurse Practitioner

## 2022-05-05 VITALS — BP 154/82

## 2022-05-05 DIAGNOSIS — I10 Essential (primary) hypertension: Secondary | ICD-10-CM

## 2022-05-05 MED ORDER — AMLODIPINE BESYLATE 5 MG PO TABS: 5.0000 mg | ORAL_TABLET | Freq: Every day | ORAL | 1 refills | Status: DC

## 2022-05-05 NOTE — Progress Notes (Signed)
Office Visit  Subjective:     Patient ID: Jennifer Quinn, female    DOB: 1956/02/26, 66 y.o.   MRN: [id number]  Chief Complaint  Patient presents with   Blood Pressure Check    HPI Patient presents today for blood pressure check. During last office visit, 04/28/22, patient was started on amlodipine 2.5 mg for HTN. Patient reports SBP readings remain elevated at home anywhere from 150-170s. She admits to checking her BP 5 times a day and reports she is very stressed about this. She also notices some fluctuation in BP depending on salt intake. Denies lower extremity swelling.   Review of Systems  Respiratory:  Negative for shortness of breath.   Cardiovascular:  Negative for chest pain.  Neurological:  Positive for headaches (sometimes).        Objective:    BP (!) 154/82  BP Readings from Last 3 Encounters:  05/05/22 (!) 154/82  04/28/22 (!) 154/92  04/05/22 (!) 172/80    Physical Exam Constitutional:      General: She is not in acute distress. Cardiovascular:     Rate and Rhythm: Normal rate and regular rhythm.     Heart sounds: Normal heart sounds.  Pulmonary:     Effort: Pulmonary effort is normal.     Breath sounds: Normal breath sounds.  Musculoskeletal:        General: Normal range of motion.     Right lower leg: No edema.     Left lower leg: No edema.  Neurological:     General: No focal deficit present.     Mental Status: She is alert and oriented to person, place, and time.     No results found for any visits on 05/05/22.      Assessment & Plan:   Problem List Items Addressed This Visit   None Visit Diagnoses     Essential hypertension    -  Primary   Relevant Medications   amLODipine (NORVASC) 5 MG tablet  Plan to increase amlodipine to '5mg'$  daily. Instructed to check BP only once a day and to try and keep stress levels low. Also discussed participating in low sodium (DASH diet) and encouraged increased water intake.      Meds ordered this  encounter  Medications   amLODipine (NORVASC) 5 MG tablet    Sig: Take 1 tablet (5 mg total) by mouth daily.    Dispense:  30 tablet    Refill:  1    Follow-up in 1-2 weeks for BP check.  Lurena Joiner, NP

## 2022-05-17 ENCOUNTER — Ambulatory Visit: Payer: No Typology Code available for payment source | Admitting: Nurse Practitioner

## 2022-05-17 ENCOUNTER — Encounter: Payer: Self-pay | Admitting: Nurse Practitioner

## 2022-05-17 VITALS — BP 126/68

## 2022-05-17 DIAGNOSIS — I10 Essential (primary) hypertension: Secondary | ICD-10-CM

## 2022-05-17 NOTE — Progress Notes (Signed)
  Established Patient Office Visit  Subjective   Patient ID: Jennifer Quinn, female    DOB: 19-Nov-1955  Age: 66 y.o. MRN: [id number]  Chief Complaint  Patient presents with   Hypertension   Patient presents today for BP re-check. Amlodipine '5mg'$  started 2 weeks ago. Since increase in amlodipine, patient reports feeling much better. Reports systolic BP at home has been 130s-140s. She did admit to taking one dose of HCTZ more than a week ago to help stabilize BP but has not taken this since. She also reports she was on vacation last week, which helped her relax some. She has improved her diet, is monitoring her sodium intake and reports she has lost 2-3 pounds.   Review of Systems  Respiratory:  Negative for shortness of breath.   Cardiovascular:  Negative for chest pain and leg swelling.  Neurological:  Negative for dizziness and headaches.      Objective:     BP 126/68    Physical Exam Constitutional:      Appearance: Normal appearance. She is not ill-appearing.  HENT:     Head: Normocephalic.  Cardiovascular:     Rate and Rhythm: Normal rate.     Heart sounds: Normal heart sounds.  Pulmonary:     Effort: Pulmonary effort is normal.     Breath sounds: Normal breath sounds.  Musculoskeletal:        General: Normal range of motion.     Right lower leg: No edema.     Left lower leg: No edema.  Neurological:     General: No focal deficit present.     Mental Status: She is alert and oriented to person, place, and time.         Assessment & Plan:   Problem List Items Addressed This Visit   None Visit Diagnoses     Essential hypertension    -  Primary  BP controlled in office today. Continue Amlodipine '5mg'$  once daily. Continue DASH diet. Continue monitoring BP at home. Follow-up with PCP as recommended. Plan to repeat BMP next week to monitoring creatinine.       Continue to follow-up as needed.   Lurena Joiner, NP

## 2022-05-24 ENCOUNTER — Other Ambulatory Visit: Payer: No Typology Code available for payment source | Admitting: Nurse Practitioner

## 2022-05-24 DIAGNOSIS — R7989 Other specified abnormal findings of blood chemistry: Secondary | ICD-10-CM

## 2022-05-25 LAB — BASIC METABOLIC PANEL
BUN: 15 mg/dL (ref 7–25)
CO2: 26 mmol/L (ref 20–32)
Calcium: 9.5 mg/dL (ref 8.6–10.4)
Chloride: 106 mmol/L (ref 98–110)
Creat: 0.85 mg/dL (ref 0.50–1.05)
Glucose, Bld: 93 mg/dL (ref 65–99)
Potassium: 3.8 mmol/L (ref 3.5–5.3)
Sodium: 140 mmol/L (ref 135–146)

## 2022-06-09 ENCOUNTER — Encounter: Payer: Self-pay | Admitting: Nurse Practitioner

## 2022-06-09 ENCOUNTER — Ambulatory Visit: Payer: No Typology Code available for payment source | Admitting: Nurse Practitioner

## 2022-06-09 VITALS — BP 150/80 | HR 78 | Temp 97.7°F

## 2022-06-09 DIAGNOSIS — J0100 Acute maxillary sinusitis, unspecified: Secondary | ICD-10-CM

## 2022-06-09 MED ORDER — DOXYCYCLINE HYCLATE 100 MG PO TABS: 100.0000 mg | ORAL_TABLET | Freq: Two times a day (BID) | ORAL | 0 refills | Status: AC

## 2022-06-09 MED ORDER — BENZONATATE 100 MG PO CAPS: 100.0000 mg | ORAL_CAPSULE | Freq: Two times a day (BID) | ORAL | 0 refills | Status: DC | PRN

## 2022-06-09 NOTE — Progress Notes (Signed)
Acute Office Visit  Subjective:     Patient ID: Jennifer Quinn, female    DOB: 1955/10/26, 66 y.o.   MRN: [id number]  Chief Complaint  Patient presents with   Sinus Problem    Patient presents today for c/o of sinus infection. Symptoms started more than a week ago, last Tuesday. Patient reports runny nose, cough, and congestion with some wheezing. Noticed worsening facial pressure 2 days ago.  Denies fevers, chills or SOB.  Has been using tylenol, naproxen, mucinex, saline nasal spray with no relief. Also using steam to clear out sinuses.   Review of Systems  Constitutional:  Negative for chills and fever.  Respiratory:  Positive for cough, sputum production and wheezing. Negative for shortness of breath.   Cardiovascular:  Negative for chest pain and leg swelling.        Objective:    BP (!) 150/80   Pulse 78   Temp 97.7 F (36.5 C)   SpO2 97%    Physical Exam Constitutional:      General: She is not in acute distress. HENT:     Head: Normocephalic.     Right Ear: Ear canal and external ear normal. There is no impacted cerumen.     Left Ear: Ear canal and external ear normal. There is no impacted cerumen.     Nose: No congestion or rhinorrhea.     Right Sinus: Maxillary sinus tenderness present.     Left Sinus: Maxillary sinus tenderness present.     Mouth/Throat:     Mouth: Mucous membranes are moist.     Pharynx: Oropharynx is clear. No oropharyngeal exudate or posterior oropharyngeal erythema.  Eyes:     Conjunctiva/sclera: Conjunctivae normal.     Pupils: Pupils are equal, round, and reactive to light.  Cardiovascular:     Rate and Rhythm: Normal rate and regular rhythm.     Heart sounds: Normal heart sounds.  Pulmonary:     Effort: Pulmonary effort is normal.     Breath sounds: Normal breath sounds.  Neurological:     General: No focal deficit present.     Mental Status: She is alert and oriented to person, place, and time.     No results found for  any visits on 06/09/22.      Assessment & Plan:   Problem List Items Addressed This Visit       Respiratory   Acute sinusitis - Primary   Relevant Medications   benzonatate (TESSALON) 100 MG capsule   doxycycline (VIBRA-TABS) 100 MG tablet  Due to worsening symptoms will treat with abx. Patient reports nausea with Augmentin so would like to try something else. Will order Doxy. Continue supportive care measures.Tessalon Perles as needed for cough.  Meds ordered this encounter  Medications   benzonatate (TESSALON) 100 MG capsule    Sig: Take 1 capsule (100 mg total) by mouth 2 (two) times daily as needed for cough.    Dispense:  20 capsule    Refill:  0   doxycycline (VIBRA-TABS) 100 MG tablet    Sig: Take 1 tablet (100 mg total) by mouth 2 (two) times daily for 7 days.    Dispense:  14 tablet    Refill:  0    Return if symptoms worsen or fail to improve.  Lurena Joiner, NP

## 2022-06-21 ENCOUNTER — Ambulatory Visit (INDEPENDENT_AMBULATORY_CARE_PROVIDER_SITE_OTHER): Payer: No Typology Code available for payment source | Admitting: Internal Medicine

## 2022-06-21 ENCOUNTER — Encounter: Payer: Self-pay | Admitting: Internal Medicine

## 2022-06-21 VITALS — BP 122/78 | HR 70 | Temp 98.8°F | Ht 62.0 in | Wt 243.8 lb

## 2022-06-21 DIAGNOSIS — E119 Type 2 diabetes mellitus without complications: Secondary | ICD-10-CM | POA: Diagnosis not present

## 2022-06-21 DIAGNOSIS — J069 Acute upper respiratory infection, unspecified: Secondary | ICD-10-CM

## 2022-06-21 DIAGNOSIS — Z23 Encounter for immunization: Secondary | ICD-10-CM

## 2022-06-21 DIAGNOSIS — Z136 Encounter for screening for cardiovascular disorders: Secondary | ICD-10-CM

## 2022-06-21 DIAGNOSIS — R600 Localized edema: Secondary | ICD-10-CM

## 2022-06-21 DIAGNOSIS — Z0000 Encounter for general adult medical examination without abnormal findings: Secondary | ICD-10-CM

## 2022-06-21 DIAGNOSIS — R7303 Prediabetes: Secondary | ICD-10-CM

## 2022-06-21 DIAGNOSIS — I10 Essential (primary) hypertension: Secondary | ICD-10-CM | POA: Diagnosis not present

## 2022-06-21 DIAGNOSIS — R635 Abnormal weight gain: Secondary | ICD-10-CM

## 2022-06-21 DIAGNOSIS — I2510 Atherosclerotic heart disease of native coronary artery without angina pectoris: Secondary | ICD-10-CM

## 2022-06-21 LAB — URINALYSIS
Bilirubin Urine: NEGATIVE
Hgb urine dipstick: NEGATIVE
Ketones, ur: NEGATIVE
Leukocytes,Ua: NEGATIVE
Nitrite: NEGATIVE
Specific Gravity, Urine: 1.01 (ref 1.000–1.030)
Total Protein, Urine: NEGATIVE
Urine Glucose: NEGATIVE
Urobilinogen, UA: 0.2 (ref 0.0–1.0)
pH: 7.5 (ref 5.0–8.0)

## 2022-06-21 LAB — CBC WITH DIFFERENTIAL/PLATELET
Basophils Absolute: 0.1 10*3/uL (ref 0.0–0.1)
Basophils Relative: 0.8 % (ref 0.0–3.0)
Eosinophils Absolute: 0.3 10*3/uL (ref 0.0–0.7)
Eosinophils Relative: 4.1 % (ref 0.0–5.0)
HCT: 40 % (ref 36.0–46.0)
Hemoglobin: 13.1 g/dL (ref 12.0–15.0)
Lymphocytes Relative: 32.7 % (ref 12.0–46.0)
Lymphs Abs: 2.1 10*3/uL (ref 0.7–4.0)
MCHC: 32.6 g/dL (ref 30.0–36.0)
MCV: 82.3 fl (ref 78.0–100.0)
Monocytes Absolute: 0.5 10*3/uL (ref 0.1–1.0)
Monocytes Relative: 7.2 % (ref 3.0–12.0)
Neutro Abs: 3.5 10*3/uL (ref 1.4–7.7)
Neutrophils Relative %: 55.2 % (ref 43.0–77.0)
Platelets: 244 10*3/uL (ref 150.0–400.0)
RBC: 4.86 Mil/uL (ref 3.87–5.11)
RDW: 14.8 % (ref 11.5–15.5)
WBC: 6.4 10*3/uL (ref 4.0–10.5)

## 2022-06-21 LAB — COMPREHENSIVE METABOLIC PANEL
ALT: 11 U/L (ref 0–35)
AST: 15 U/L (ref 0–37)
Albumin: 4.3 g/dL (ref 3.5–5.2)
Alkaline Phosphatase: 60 U/L (ref 39–117)
BUN: 15 mg/dL (ref 6–23)
CO2: 31 mEq/L (ref 19–32)
Calcium: 9.4 mg/dL (ref 8.4–10.5)
Chloride: 99 mEq/L (ref 96–112)
Creatinine, Ser: 0.93 mg/dL (ref 0.40–1.20)
GFR: 63.33 mL/min (ref >60.00)
Glucose, Bld: 94 mg/dL (ref 70–99)
Potassium: 3.7 mEq/L (ref 3.5–5.1)
Sodium: 137 mEq/L (ref 135–145)
Total Bilirubin: 0.6 mg/dL (ref 0.2–1.2)
Total Protein: 7.4 g/dL (ref 6.0–8.3)

## 2022-06-21 LAB — HEMOGLOBIN A1C: Hgb A1c MFr Bld: 6.4 % (ref 4.6–6.5)

## 2022-06-21 LAB — LIPID PANEL
Cholesterol: 213 mg/dL — ABNORMAL HIGH (ref 0–200)
HDL: 41.3 mg/dL (ref >39.00)
LDL Cholesterol: 137 mg/dL — ABNORMAL HIGH (ref 0–99)
NonHDL: 172.15
Total CHOL/HDL Ratio: 5
Triglycerides: 177 mg/dL — ABNORMAL HIGH (ref 0.0–149.0)
VLDL: 35.4 mg/dL (ref 0.0–40.0)

## 2022-06-21 LAB — TSH: TSH: 2.12 u[IU]/mL (ref 0.35–5.50)

## 2022-06-21 MED ORDER — AMLODIPINE BESYLATE 5 MG PO TABS: 5.0000 mg | ORAL_TABLET | Freq: Every day | ORAL | 3 refills | Status: DC

## 2022-06-21 MED ORDER — ALBUTEROL SULFATE HFA 108 (90 BASE) MCG/ACT IN AERS: 2.0000 | INHALATION_SPRAY | Freq: Four times a day (QID) | RESPIRATORY_TRACT | 3 refills | Status: DC | PRN

## 2022-06-21 MED ORDER — METFORMIN HCL 500 MG PO TABS: 500.0000 mg | ORAL_TABLET | Freq: Every day | ORAL | 3 refills | Status: DC

## 2022-06-21 MED ORDER — OZEMPIC (0.25 OR 0.5 MG/DOSE) 2 MG/3ML ~~LOC~~ SOPN: 0.5000 mg | PEN_INJECTOR | SUBCUTANEOUS | 3 refills | Status: DC

## 2022-06-21 NOTE — Progress Notes (Addendum)
Pls cosign for TDAP inj.Marland KitchenJohny Quinn   Medical screening examination/treatment/procedure(s) were performed by non-physician practitioner and as supervising physician I was immediately available for consultation/collaboration.  I agree with above. Lew Dawes, MD

## 2022-06-21 NOTE — Addendum Note (Signed)
Addended by: Earnstine Regal on: 06/21/2022 03:05 PM   Modules accepted: Orders

## 2022-06-21 NOTE — Assessment & Plan Note (Signed)
Check TSH 

## 2022-06-21 NOTE — Progress Notes (Signed)
Subjective:  Patient ID: Jennifer Quinn, female    DOB: 01/28/1956  Age: 66 y.o. MRN: [id number]  CC: Annual Exam (Discuss abt getting ref for aorta aneurysm)   HPI Jennifer Quinn presents for a well exam  Outpatient Medications Prior to Visit  Medication Sig Dispense Refill   acetaminophen (TYLENOL) 500 MG tablet Take 500 mg by mouth every 6 (six) hours as needed.     benzonatate (TESSALON) 100 MG capsule Take 1 capsule (100 mg total) by mouth 2 (two) times daily as needed for cough. 20 capsule 0   buPROPion (WELLBUTRIN) 100 MG tablet Take 1 tablet (100 mg total) by mouth daily. 30 tablet 5   Cholecalciferol (VITAMIN D PO) Take 1,000 Units by mouth daily.      Fish Oil OIL Take 2,000 mg by mouth daily.     amLODipine (NORVASC) 5 MG tablet Take 1 tablet (5 mg total) by mouth daily. 30 tablet 1   metFORMIN (GLUCOPHAGE) 500 MG tablet Take 1 tablet (500 mg total) by mouth daily with breakfast. 90 tablet 1   albuterol (VENTOLIN HFA) 108 (90 Base) MCG/ACT inhaler Inhale 2 puffs into the lungs every 6 (six) hours as needed for wheezing or shortness of breath. (Patient not taking: Reported on 04/28/2022) 8 g 2   No facility-administered medications prior to visit.    ROS: Review of Systems  Constitutional:  Positive for unexpected weight change. Negative for activity change, appetite change, chills and fatigue.  HENT:  Negative for congestion, mouth sores and sinus pressure.   Eyes:  Negative for visual disturbance.  Respiratory:  Negative for cough and chest tightness.   Gastrointestinal:  Negative for abdominal pain and nausea.  Genitourinary:  Negative for difficulty urinating, frequency and vaginal pain.  Musculoskeletal:  Negative for back pain and gait problem.  Skin:  Negative for pallor and rash.  Neurological:  Negative for dizziness, tremors, weakness, numbness and headaches.  Psychiatric/Behavioral:  Negative for confusion and sleep disturbance.     Objective:  BP 122/78 (BP  Location: Left Arm)   Pulse 70   Temp 98.8 F (37.1 C) (Oral)   Ht '5\' 2"'$  (1.575 m)   Wt 243 lb 12.8 oz (110.6 kg)   SpO2 97%   BMI 44.59 kg/m   BP Readings from Last 3 Encounters:  06/21/22 122/78  06/09/22 (!) 150/80  05/17/22 126/68    Wt Readings from Last 3 Encounters:  06/21/22 243 lb 12.8 oz (110.6 kg)  04/05/22 240 lb (108.9 kg)  01/10/22 238 lb (108 kg)    Physical Exam Constitutional:      General: She is not in acute distress.    Appearance: She is well-developed. She is obese.  HENT:     Head: Normocephalic.     Right Ear: External ear normal.     Left Ear: External ear normal.     Nose: Nose normal.  Eyes:     General:        Right eye: No discharge.        Left eye: No discharge.     Conjunctiva/sclera: Conjunctivae normal.     Pupils: Pupils are equal, round, and reactive to light.  Neck:     Thyroid: No thyromegaly.     Vascular: No JVD.     Trachea: No tracheal deviation.  Cardiovascular:     Rate and Rhythm: Normal rate and regular rhythm.     Heart sounds: Normal heart sounds.  Pulmonary:  Effort: No respiratory distress.     Breath sounds: No stridor. No wheezing.  Abdominal:     General: Bowel sounds are normal. There is no distension.     Palpations: Abdomen is soft. There is no mass.     Tenderness: There is no abdominal tenderness. There is no guarding or rebound.  Musculoskeletal:        General: No tenderness.     Cervical back: Normal range of motion and neck supple. No rigidity.  Lymphadenopathy:     Cervical: No cervical adenopathy.  Skin:    Findings: No erythema or rash.  Neurological:     Cranial Nerves: No cranial nerve deficit.     Motor: No abnormal muscle tone.     Coordination: Coordination normal.     Deep Tendon Reflexes: Reflexes normal.  Psychiatric:        Behavior: Behavior normal.        Thought Content: Thought content normal.        Judgment: Judgment normal.     Lab Results  Component Value Date    WBC 5.1 04/05/2022   HGB 12.7 04/05/2022   HCT 38.0 04/05/2022   PLT 276 04/05/2022   GLUCOSE 93 05/24/2022   CHOL 179 08/26/2020   TRIG 82.0 08/26/2020   HDL 40.80 08/26/2020   LDLDIRECT 157.0 05/09/2018   LDLCALC 121 (H) 08/26/2020   ALT 11 06/16/2021   AST 15 06/16/2021   NA 140 05/24/2022   K 3.8 05/24/2022   CL 106 05/24/2022   CREATININE 0.85 05/24/2022   BUN 15 05/24/2022   CO2 26 05/24/2022   TSH 4.68 06/16/2021   HGBA1C 5.9 08/26/2020    DG Chest 2 View  Result Date: 04/05/2022 CLINICAL DATA:  Chest pain beginning last night EXAM: CHEST - 2 VIEW COMPARISON:  CT 10/13/2021 FINDINGS: The heart size and mediastinal contours are within normal limits. Both lungs are clear. The visualized skeletal structures are unremarkable. IMPRESSION: No active cardiopulmonary disease. Electronically Signed   By: Nelson Chimes M.D.   On: 04/05/2022 11:34    Assessment & Plan:   Problem List Items Addressed This Visit     Coronary atherosclerosis    Coronary calcium CT score of 6.51 Agatston units 2020 Start on fish oil      Relevant Medications   amLODipine (NORVASC) 5 MG tablet   Diabetes (HCC)    Cont Metformin Start Ozempic       Relevant Medications   metFORMIN (GLUCOPHAGE) 500 MG tablet   Semaglutide,0.25 or 0.'5MG'$ /DOS, (OZEMPIC, 0.25 OR 0.5 MG/DOSE,) 2 MG/3ML SOPN   Other Relevant Orders   Hemoglobin A1c   Edema    On Norvasc and HCT      Weight gain    Check TSH      Well adult exam - Primary    We discussed age appropriate health related issues, including available/recomended screening tests and vaccinations. We discussed a need for adhering to healthy diet and exercise. Labs ordered. All questions were answered. She has a GYN Dr Clotilde Dieter q 12 mo Colon up to date Coronary calcium CT score of 6.51 Agatston units 2020 2/23 coronary calcium CT score is 6.51. On fish oil      Relevant Orders   Comprehensive metabolic panel   CBC with Differential/Platelet    Urinalysis   TSH   Hemoglobin A1c   Lipid panel   Other Visit Diagnoses     Viral upper respiratory tract infection  Relevant Medications   albuterol (VENTOLIN HFA) 108 (90 Base) MCG/ACT inhaler   Essential hypertension       Relevant Medications   amLODipine (NORVASC) 5 MG tablet   Prediabetes       Relevant Medications   metFORMIN (GLUCOPHAGE) 500 MG tablet   Screening for AAA (abdominal aortic aneurysm)       Relevant Orders   US AORTA         Meds ordered this encounter  Medications   albuterol (VENTOLIN HFA) 108 (90 Base) MCG/ACT inhaler    Sig: Inhale 2 puffs into the lungs every 6 (six) hours as needed for wheezing or shortness of breath.    Dispense:  8 g    Refill:  3   amLODipine (NORVASC) 5 MG tablet    Sig: Take 1 tablet (5 mg total) by mouth daily.    Dispense:  90 tablet    Refill:  3   metFORMIN (GLUCOPHAGE) 500 MG tablet    Sig: Take 1 tablet (500 mg total) by mouth daily with breakfast.    Dispense:  90 tablet    Refill:  3   Semaglutide,0.25 or 0.'5MG'$ /DOS, (OZEMPIC, 0.25 OR 0.5 MG/DOSE,) 2 MG/3ML SOPN    Sig: Inject 0.5 mg into the skin once a week.    Dispense:  3 mL    Refill:  3      Follow-up: Return in about 3 months (around 09/21/2022) for a follow-up visit.  Walker Kehr, MD

## 2022-06-21 NOTE — Assessment & Plan Note (Addendum)
Cont Metformin Start Ozempic

## 2022-06-21 NOTE — Assessment & Plan Note (Signed)
On Norvasc and HCT

## 2022-06-21 NOTE — Assessment & Plan Note (Signed)
Coronary calcium CT score of 6.51 Agatston units 2020 Start on fish oil

## 2022-06-21 NOTE — Addendum Note (Signed)
Addended by: Earnstine Regal on: 06/21/2022 03:46 PM   Modules accepted: Orders

## 2022-06-21 NOTE — Assessment & Plan Note (Addendum)
We discussed age appropriate health related issues, including available/recomended screening tests and vaccinations. We discussed a need for adhering to healthy diet and exercise. Labs ordered. All questions were answered. She has a GYN Dr Clotilde Dieter q 12 mo Colon up to date Coronary calcium CT score of 6.51 Agatston units 2020 2/23 coronary calcium CT score is 6.51. On fish oil

## 2022-06-22 ENCOUNTER — Encounter: Payer: Self-pay | Admitting: Internal Medicine

## 2022-06-23 NOTE — Telephone Encounter (Signed)
Pt need PA for Ozempic submitted w/ (Key: [id number]). PA sent to [facility] for approval status.Marland KitchenJohny Chess

## 2022-06-24 ENCOUNTER — Ambulatory Visit
Admission: RE | Admit: 2022-06-24 | Discharge: 2022-06-24 | Disposition: A | Discharge status: Home / Self Care | Payer: No Typology Code available for payment source | Source: Ambulatory Visit | Attending: Internal Medicine | Admitting: Internal Medicine

## 2022-06-24 DIAGNOSIS — Z136 Encounter for screening for cardiovascular disorders: Secondary | ICD-10-CM

## 2022-06-27 NOTE — Telephone Encounter (Signed)
Rec'd determination med was approved. Effective 06/25/22 through 06/25/23 faxed approval to p[facility] & notified pt w/status.Marland KitchenJohny Quinn

## 2022-06-30 ENCOUNTER — Ambulatory Visit: Payer: No Typology Code available for payment source | Admitting: Nurse Practitioner

## 2022-06-30 DIAGNOSIS — Z7189 Other specified counseling: Secondary | ICD-10-CM

## 2022-06-30 DIAGNOSIS — Z79899 Other long term (current) drug therapy: Secondary | ICD-10-CM

## 2022-06-30 NOTE — Progress Notes (Signed)
Patient presents today with Ozempic, started by PCP for hx of Type 2 Diabetes. Would like help with administration and education on potential side effects.   First dose of Ozempic (0.'25mg'$ ) administered today to left lower abdomen. Showed how to prep ozempic pen and correct method for administration. Patient tolerated injection well. Discussed potential side effects including nausea, vomiting, loss of appetite, allergic reactions, etc.  Patient to return in 1 week for second dose. Encouraged to follow-up as needed.

## 2022-07-01 IMAGING — CT CT CARDIAC CORONARY ARTERY CALCIUM SCORE
3 series · 14 of 20 positions shown, 16 images · non-contrast
Comparison: None.
COMPARISON: None.

Addendum:
EXAM:
OVER-READ INTERPRETATION  CT CHEST

The following report is an over-read performed by radiologist Dr.
Meranda Munos [REDACTED] on 10/13/2021. This
over-read does not include interpretation of cardiac or coronary
anatomy or pathology. The coronary calcium score interpretation by
the cardiologist is attached.
CLINICAL DATA: Cardiovascular Disease Risk stratification
Coronary Calcium Score
TECHNIQUE: A gated, non-contrast computed tomography scan of the heart was
performed using 3mm slice thickness. Axial images were analyzed on a
dedicated workstation. Calcium scoring of the coronary arteries was
performed using the Agatston method.

[Series 2: cascseq 2.0 sa36 70% (id) · axial · 0.39mm/px · z∈[-221,-141]mm · 4 of 68 slices shown]
[im 14/68  vessel]
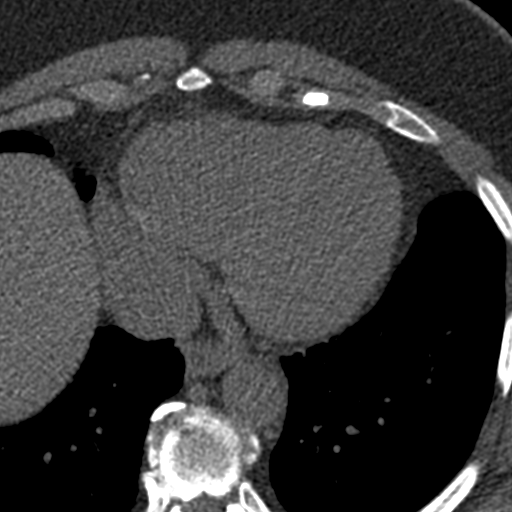
[im 27/68  vessel]
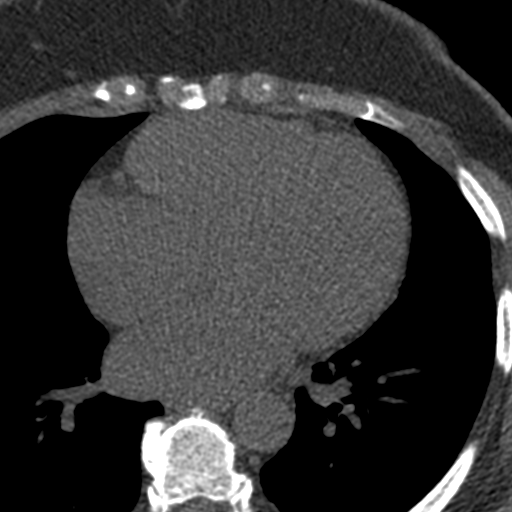
[im 41/68  vessel]
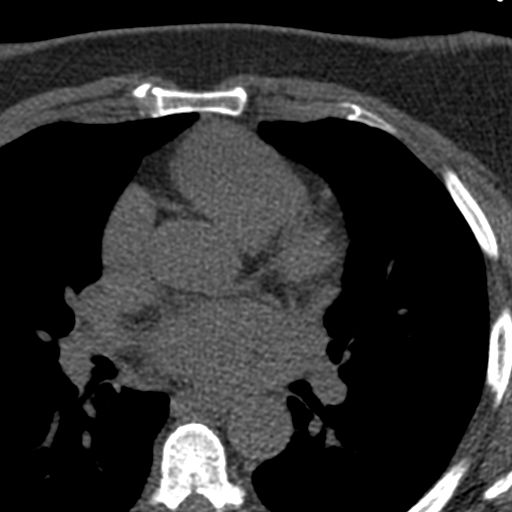
[im 54/68  vessel]
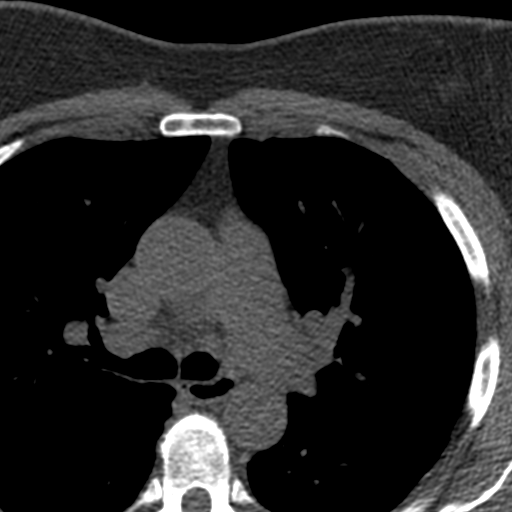

[Series 3: cascseq 2.0 bf37 st · axial · 0.67mm/px · z∈[-225,-137]mm · 5 of 68 slices shown, 7 images]
[im 12/68  vessel]
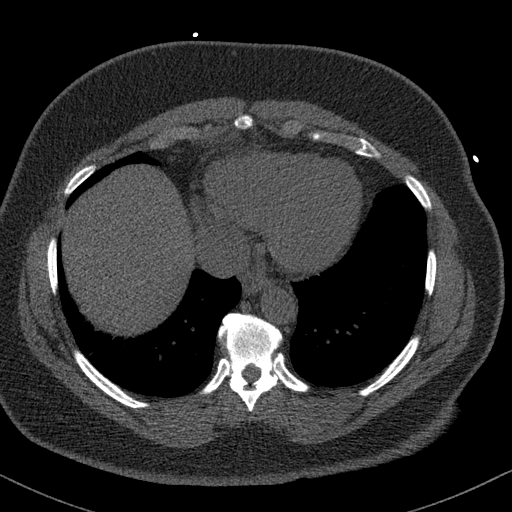
[im 12/68  lung]
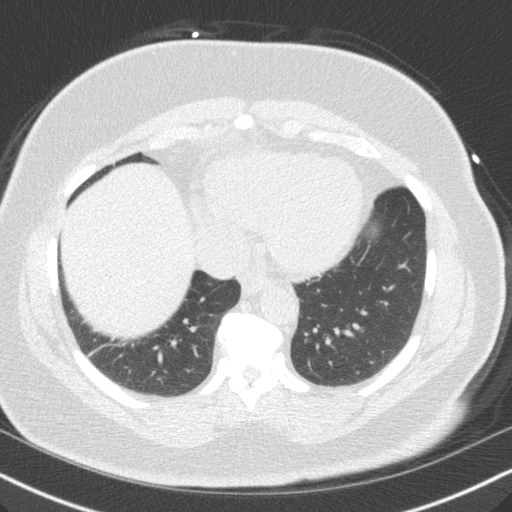
[im 23/68  vessel]
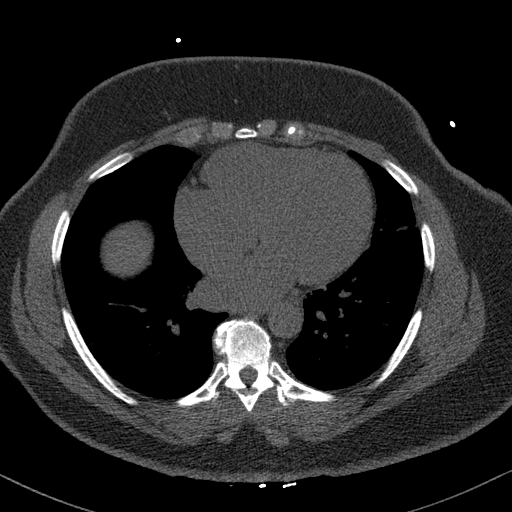
[im 34/68  vessel]
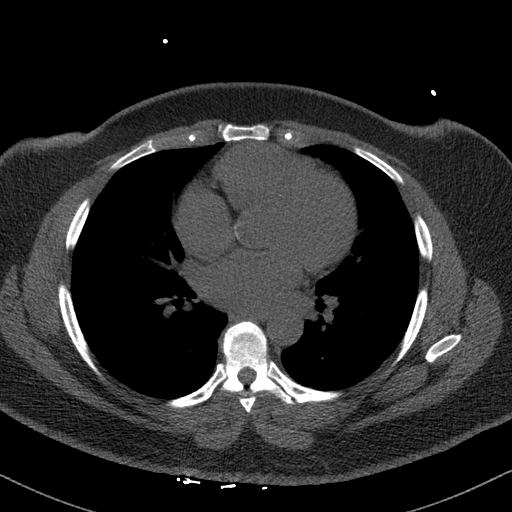
[im 45/68  vessel]
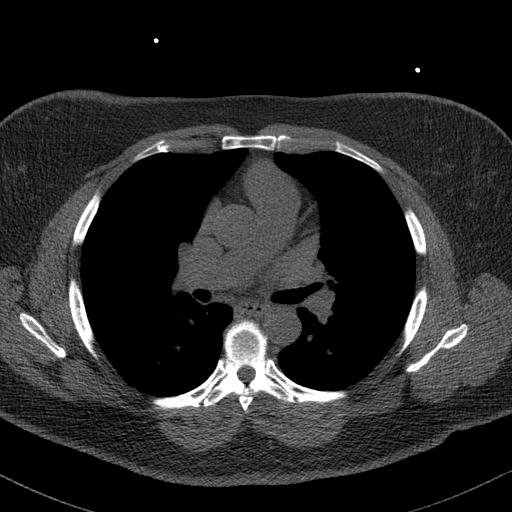
[im 56/68  vessel]
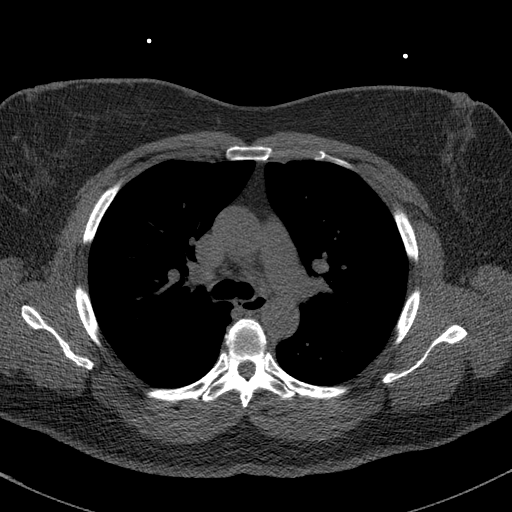
[im 56/68  lung]
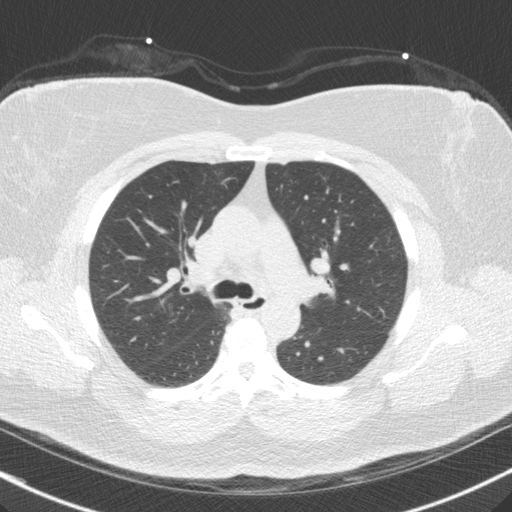

[Series 4: cascseq 2.0 br59 lung · axial · 0.67mm/px · z∈[-225,-137]mm · 5 of 68 slices shown]
[im 12/68  lung]
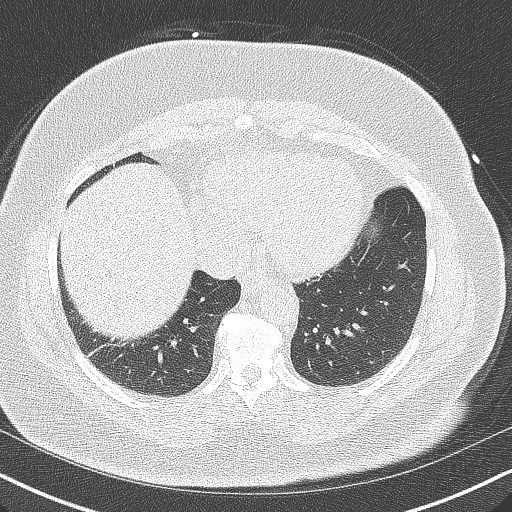
[im 23/68  lung]
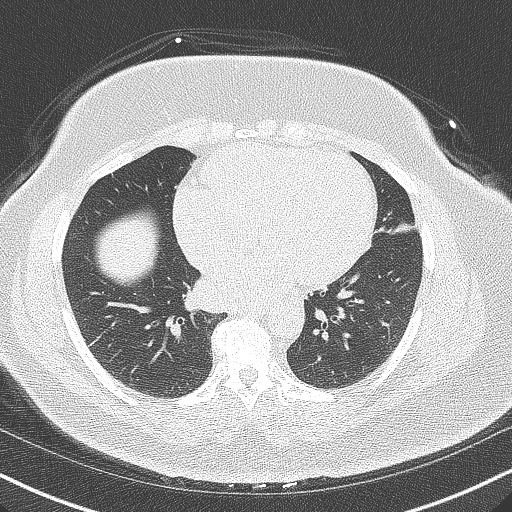
[im 34/68  lung]
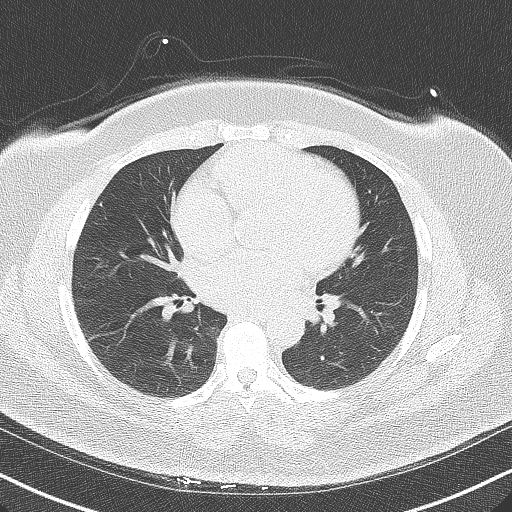
[im 45/68  lung]
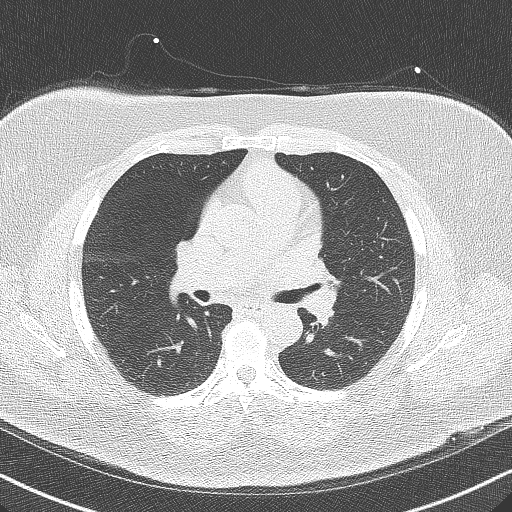
[im 56/68  lung]
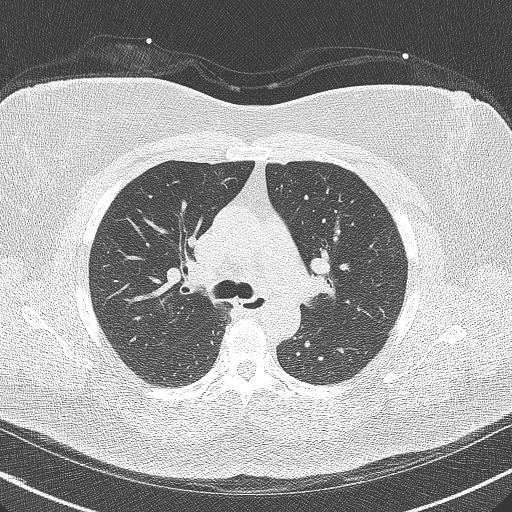

[14 of 20 positions shown; findings below may reference images not displayed]

FINDINGS: Atherosclerotic calcifications in the thoracic aorta. Within the
visualized portions of the thorax there are no suspicious appearing
pulmonary nodules or masses, there is no acute consolidative
airspace disease, no pleural effusions, no pneumothorax and no
lymphadenopathy. Visualized portions of the upper abdomen are
unremarkable. There are no aggressive appearing lytic or blastic
lesions noted in the visualized portions of the skeleton.
IMPRESSION: 1.  Aortic Atherosclerosis (RGMDO-R0L.L).
FINDINGS: Coronary arteries: Normal origins.

Coronary Calcium Score:

Left main: 0

Left anterior descending artery:

Left circumflex artery: 0

Right coronary artery: 0

Total:

Percentile: 53rd

Pericardium: Normal.

Ascending Aorta: Normal caliber.

Non-cardiac: See separate report from [REDACTED].
IMPRESSION: Coronary calcium score of 6.51 Agatston units. This was 53rd
percentile for age-, race-, and sex-matched controls.



If CAC=0, it is reasonable to withhold statin therapy and reassess
in 5 to 10 years, as long as higher risk conditions are absent
(diabetes mellitus, family history of premature CHD in first degree
relatives (males <55 years; females <65 years), cigarette smoking,
or LDL >=190 mg/dL).

If CAC is 1 to 99, it is reasonable to initiate statin therapy for
patients >=55 years of age.

If CAC is >=100 or >=75th percentile, it is reasonable to initiate
statin therapy at any age.

Cardiology referral should be considered for patients with CAC
scores >=400 or >=75th percentile.

*9141 AHA/ACC/AACVPR/AAPA/ABC/VILLAFANE/SIMAN/ALURIA/Mertens/JUMPER/RONLOR/DARLEEN
Guideline on the Management of Blood Cholesterol: A Report of the
American College of Cardiology/American Heart Association Task Force
on Clinical Practice Guidelines. J Am Coll Cardiol.
7915;73(24):8324-8174.

*** End of Addendum ***
EXAM:
OVER-READ INTERPRETATION  CT CHEST

The following report is an over-read performed by radiologist Dr.
Meranda Munos [REDACTED] on 10/13/2021. This
over-read does not include interpretation of cardiac or coronary
anatomy or pathology. The coronary calcium score interpretation by
the cardiologist is attached.
FINDINGS: Atherosclerotic calcifications in the thoracic aorta. Within the
visualized portions of the thorax there are no suspicious appearing
pulmonary nodules or masses, there is no acute consolidative
airspace disease, no pleural effusions, no pneumothorax and no
lymphadenopathy. Visualized portions of the upper abdomen are
unremarkable. There are no aggressive appearing lytic or blastic
lesions noted in the visualized portions of the skeleton.
IMPRESSION: 1.  Aortic Atherosclerosis (RGMDO-R0L.L).

## 2022-07-03 ENCOUNTER — Other Ambulatory Visit: Payer: Self-pay | Admitting: Internal Medicine

## 2022-07-03 DIAGNOSIS — F3289 Other specified depressive episodes: Secondary | ICD-10-CM

## 2022-07-26 ENCOUNTER — Ambulatory Visit: Payer: No Typology Code available for payment source | Admitting: Nurse Practitioner

## 2022-07-26 DIAGNOSIS — Z7189 Other specified counseling: Secondary | ICD-10-CM

## 2022-07-26 NOTE — Progress Notes (Signed)
Patient presents today requesting help with Ozempic pen. Reports pen leaks at home after injection and would like re-education on how to inject correctly. In clinic, patient was asked to self-administer ozempic. After correctly setting up pen and cleaning injection site, it was noted that patient removed only one needle cap (not both) before administration of ozempic. Due to this, patient was likely not receiving any medication.  Both caps were removed and patient was able to successfully administer ozempic. Patient was very thankful.  Return to clinic as needed for any questions or concerns.

## 2022-08-05 ENCOUNTER — Other Ambulatory Visit: Payer: Self-pay | Admitting: Internal Medicine

## 2022-08-05 DIAGNOSIS — Z1231 Encounter for screening mammogram for malignant neoplasm of breast: Secondary | ICD-10-CM

## 2022-09-08 ENCOUNTER — Encounter: Payer: Self-pay | Admitting: Internal Medicine

## 2022-09-11 ENCOUNTER — Other Ambulatory Visit: Payer: Self-pay | Admitting: Internal Medicine

## 2022-09-11 MED ORDER — SEMAGLUTIDE (1 MG/DOSE) 4 MG/3ML ~~LOC~~ SOPN: 1.0000 mg | PEN_INJECTOR | SUBCUTANEOUS | 3 refills | Status: DC

## 2022-09-12 ENCOUNTER — Other Ambulatory Visit: Payer: Self-pay | Admitting: Internal Medicine

## 2022-09-28 ENCOUNTER — Ambulatory Visit
Admission: RE | Admit: 2022-09-28 | Discharge: 2022-09-28 | Disposition: A | Discharge status: Home / Self Care | Payer: No Typology Code available for payment source | Source: Ambulatory Visit | Attending: Internal Medicine | Admitting: Internal Medicine

## 2022-09-28 DIAGNOSIS — Z1231 Encounter for screening mammogram for malignant neoplasm of breast: Secondary | ICD-10-CM

## 2022-09-28 NOTE — Telephone Encounter (Signed)
Submitted PA w/  (Key: [id number]) rec'd msg PA has been sent to [facility]. Notified pt of status..../LMB

## 2022-09-29 NOTE — Telephone Encounter (Signed)
Rec'd determination fax med was approved effective 09/29/22 through 06/25/23. Notified pt via mychart of status.Marland KitchenJohny Quinn

## 2022-10-04 ENCOUNTER — Ambulatory Visit: Payer: No Typology Code available for payment source | Admitting: Nurse Practitioner

## 2022-10-04 ENCOUNTER — Encounter: Payer: Self-pay | Admitting: Nurse Practitioner

## 2022-10-04 VITALS — BP 144/92 | HR 65 | Temp 97.5°F

## 2022-10-04 DIAGNOSIS — I10 Essential (primary) hypertension: Secondary | ICD-10-CM

## 2022-10-04 DIAGNOSIS — Z789 Other specified health status: Secondary | ICD-10-CM

## 2022-10-04 DIAGNOSIS — R350 Frequency of micturition: Secondary | ICD-10-CM

## 2022-10-04 LAB — POCT URINALYSIS DIP (CLINITEK)
Bilirubin, UA: NEGATIVE
Blood, UA: NEGATIVE
Glucose, UA: NEGATIVE mg/dL
Ketones, POC UA: NEGATIVE mg/dL
Leukocytes, UA: NEGATIVE
Nitrite, UA: NEGATIVE
POC PROTEIN,UA: NEGATIVE
Spec Grav, UA: 1.02 (ref 1.010–1.025)
Urobilinogen, UA: 0.2 E.U./dL
pH, UA: 6 (ref 5.0–8.0)

## 2022-10-04 NOTE — Progress Notes (Signed)
[facility]  Subjective:  Patient ID: Jennifer Quinn, female    DOB: 11-13-1955  Age: 67 y.o. MRN: [id number]  CC: wellness exam   HPI [name] presents for wellness exam visit for insurance benefit.  Patient has a PCP: Dr. Alain Marion PMH significant for: HTN on amlodipine. Diabetes on metformin and ozempic, reports blood sugars at home are in the 90's. Last labs per PCP were completed: June 21, 2022  Health Maintenance:  Colonoscopy: April 2023, repeat in 5 years Mammogram: Feb 2024 DEXA: 2019    Smoker: Former   Immunizations:  Shingrix-  x1, declines second vaccine.  Pneumovax - declines  COVID- x 5  Lifestyle: Diet- endorses eating lots of fruits and vegetables. Has reduce red meats. Has lost 14 pounds on ozempic. Exercise- exercise bike and treadmill for 30 minutes, twice weekly.    Other concerns: when discussing urinary symptoms, patient reports feeling incomplete bladder emptying, urinary frequency, and "dribbling" of urine. She denies pain with urination or hematuria . Urine without foul odor. Noticed this started a little over a month ago.   Past Medical History:  Diagnosis Date   Asthma    Chest pain    Chronic constipation    Depression    Fibroid    GERD (gastroesophageal reflux disease)    occ   History of anemia    Hypertension    PMB (postmenopausal bleeding)    Vitamin D deficiency    Wears contact lenses    Wears partial dentures    upper and lower    Past Surgical History:  Procedure Laterality Date   COLONOSCOPY  2007, 01/25/2011. 05/25/16   DILATATION & CURETTAGE/HYSTEROSCOPY WITH MYOSURE N/A 07/23/2018   Procedure: DILATATION & CURETTAGE/HYSTEROSCOPY WITH MYOSURE;  Surgeon: Salvadore Dom, MD;  Location: [facility];  Service: Gynecology;  Laterality: N/A;  Follow 1st case. Use [facility] virtual OR in place of [facility] block time.    Outpatient Medications Prior to Visit  Medication Sig Dispense Refill    acetaminophen (TYLENOL) 500 MG tablet Take 500 mg by mouth every 6 (six) hours as needed.     albuterol (VENTOLIN HFA) 108 (90 Base) MCG/ACT inhaler Inhale 2 puffs into the lungs every 6 (six) hours as needed for wheezing or shortness of breath. 8 g 3   buPROPion (WELLBUTRIN) 100 MG tablet TAKE 1 TABLET BY MOUTH EVERY DAY 90 tablet 3   Cholecalciferol (VITAMIN D PO) Take 1,000 Units by mouth daily.      metFORMIN (GLUCOPHAGE) 500 MG tablet Take 1 tablet (500 mg total) by mouth daily with breakfast. 90 tablet 3   Semaglutide, 1 MG/DOSE, 4 MG/3ML SOPN Inject 1 mg as directed once a week. 3 mL 3   amLODipine (NORVASC) 5 MG tablet Take 1 tablet (5 mg total) by mouth daily. 90 tablet 3   Fish Oil OIL Take 2,000 mg by mouth daily.     benzonatate (TESSALON) 100 MG capsule Take 1 capsule (100 mg total) by mouth 2 (two) times daily as needed for cough. (Patient not taking: Reported on 10/04/2022) 20 capsule 0   No facility-administered medications prior to visit.    ROS Review of Systems  Constitutional:  Negative for fatigue and fever.  HENT:  Negative for sinus pressure.   Respiratory:  Negative for shortness of breath and wheezing.   Cardiovascular:  Negative for chest pain and leg swelling.  Gastrointestinal:  Positive for constipation (takes miralax as needed,).  Endocrine: Negative  for polydipsia, polyphagia and polyuria.  Genitourinary:  Positive for difficulty urinating and frequency (sometimes). Negative for dysuria.  Musculoskeletal:  Negative for arthralgias and myalgias.  Neurological:  Negative for headaches.    Objective:  BP (!) 144/92   Pulse 65   Temp (!) 97.5 F (36.4 C)   SpO2 99%   Physical Exam Constitutional:      General: She is not in acute distress. HENT:     Head: Normocephalic.  Eyes:     Pupils: Pupils are equal, round, and reactive to light.  Cardiovascular:     Rate and Rhythm: Normal rate and regular rhythm.     Heart sounds: Normal heart sounds.   Pulmonary:     Effort: Pulmonary effort is normal.     Breath sounds: Normal breath sounds.  Abdominal:     General: Abdomen is flat.     Palpations: Abdomen is soft.     Tenderness: There is no right CVA tenderness or left CVA tenderness.  Musculoskeletal:        General: Normal range of motion.     Cervical back: Normal range of motion.     Right lower leg: No edema.     Left lower leg: No edema.  Skin:    General: Skin is warm.  Neurological:     General: No focal deficit present.     Mental Status: She is alert and oriented to person, place, and time.  Psychiatric:        Mood and Affect: Mood normal.        Behavior: Behavior normal.      Assessment & Plan:    Ganelle was seen today for wellness exam.  Diagnoses and all orders for this visit:  Participant in health and wellness plan Adult wellness physical was conducted today. Importance of diet and exercise were discussed in detail.  We reviewed immunizations and gave recommendations regarding current immunization needed for age. Patient declines pneumonia vaccine and second shingrix vaccine as this time.  Preventative health exams are up to date.  Patient was advised yearly wellness exam and follow-up with PCP as scheduled.   Essential hypertension BP slightly elevated today. Encouraged to check BP daily at home over the next few days. Follow-up if it remains elevated. Discussed BO goal less than 130/80.   Urinary frequency UA negative for signs of infection. No glucose in urine. Suspect overactive bladder. Patient not interested in starting medication at this time. Can follow-up if symptoms persist.   -     POCT URINALYSIS DIP (CLINITEK)     Orders Placed This Encounter  Procedures   POCT URINALYSIS DIP (CLINITEK)    No orders of the defined types were placed in this encounter.   Follow-up: As needed.

## 2022-11-21 ENCOUNTER — Encounter: Payer: Self-pay | Admitting: Family Medicine

## 2022-12-14 ENCOUNTER — Encounter: Payer: Self-pay | Admitting: Family Medicine

## 2022-12-14 ENCOUNTER — Ambulatory Visit (INDEPENDENT_AMBULATORY_CARE_PROVIDER_SITE_OTHER): Payer: No Typology Code available for payment source | Admitting: Family Medicine

## 2022-12-14 VITALS — BP 138/80 | Ht 62.0 in | Wt 229.0 lb

## 2022-12-14 DIAGNOSIS — G8929 Other chronic pain: Secondary | ICD-10-CM | POA: Diagnosis not present

## 2022-12-14 DIAGNOSIS — M25562 Pain in left knee: Secondary | ICD-10-CM | POA: Diagnosis not present

## 2022-12-14 DIAGNOSIS — M1712 Unilateral primary osteoarthritis, left knee: Secondary | ICD-10-CM

## 2022-12-14 MED ORDER — METHYLPREDNISOLONE ACETATE 40 MG/ML IJ SUSP
40.0000 mg | Freq: Once | INTRAMUSCULAR | Status: AC
  Administered 2022-12-14: 40 mg via INTRA_ARTICULAR

## 2022-12-14 NOTE — Progress Notes (Signed)
PCP: Plotnikov, Georgina Quint, MD  Subjective:   HPI: Patient is a 67 y.o. female here for left knee pain.  Patient reports history previously of left knee pain - diagnosed with arthritis and given a shot though this was years ago. Current pain started about 2 months ago, anterolateral. Worse when she sits for a while then goes to get up. Tried ibuprofen, meloxicam with minimal benefit. + night pain Knee crunching but no catching, locking, giving out.  Past Medical History:  Diagnosis Date   Asthma    Chest pain    Chronic constipation    Depression    Fibroid    GERD (gastroesophageal reflux disease)    occ   History of anemia    Hypertension    PMB (postmenopausal bleeding)    Vitamin D deficiency    Wears contact lenses    Wears partial dentures    upper and lower    Current Outpatient Medications on File Prior to Visit  Medication Sig Dispense Refill   acetaminophen (TYLENOL) 500 MG tablet Take 500 mg by mouth every 6 (six) hours as needed.     albuterol (VENTOLIN HFA) 108 (90 Base) MCG/ACT inhaler Inhale 2 puffs into the lungs every 6 (six) hours as needed for wheezing or shortness of breath. 8 g 3   amLODipine (NORVASC) 5 MG tablet Take 1 tablet (5 mg total) by mouth daily. 90 tablet 3   buPROPion (WELLBUTRIN) 100 MG tablet TAKE 1 TABLET BY MOUTH EVERY DAY 90 tablet 3   Cholecalciferol (VITAMIN D PO) Take 1,000 Units by mouth daily.      Fish Oil OIL Take 2,000 mg by mouth daily.     metFORMIN (GLUCOPHAGE) 500 MG tablet Take 1 tablet (500 mg total) by mouth daily with breakfast. 90 tablet 3   Semaglutide, 1 MG/DOSE, 4 MG/3ML SOPN Inject 1 mg as directed once a week. 3 mL 3   No current facility-administered medications on file prior to visit.    Past Surgical History:  Procedure Laterality Date   COLONOSCOPY  2007, 01/25/2011. 05/25/16   DILATATION & CURETTAGE/HYSTEROSCOPY WITH MYOSURE N/A 07/23/2018   Procedure: DILATATION & CURETTAGE/HYSTEROSCOPY WITH MYOSURE;   Surgeon: Romualdo Bolk, MD;  Location: [facility];  Service: Gynecology;  Laterality: N/A;  Follow 1st case. Use [facility] virtual OR in place of [facility] block time.    No Known Allergies  BP 138/80   Ht  (1.575 m)   Wt 229 lb (103.9 kg)   BMI 41.88 kg/m       No data to display              No data to display              Objective:  Physical Exam:  Gen: NAD, comfortable in exam room  Left knee: No gross deformity, ecchymoses, swelling. TTP lateral > medial joint line.  No other tenderness. FROM with normal strength. Negative ant/post drawers. Negative valgus/varus testing. Negative lachman.  Negative mcmurrays, apleys.  NV intact distally.   Assessment & Plan:  1. Left knee pain - 2/2 arthritis.  Home exercises, medications reviewed.  Steroid injection given today for pain relief.  See instructions for further.  Meloxicam OR ibuprofen if needed, not both.  F/u prn.  After informed written consent timeout was performed, patient was seated on exam table. Left knee was prepped with alcohol swab and utilizing anteromedial approach, patient's left knee was injected intraarticularly with 3:1 lidocaine:  depomedrol. Patient tolerated the procedure well without immediate complications.

## 2022-12-14 NOTE — Patient Instructions (Signed)
Your pain is due to arthritis. These are the different medications you can take for this: Tylenol  1-2 tabs three times a day for pain. Voltaren gel, capsaicin, aspercreme, or biofreeze topically up to four times a day may also help with pain. Some supplements that may help for arthritis: Boswellia extract, curcumin, pycnogenol Ibuprofen OR meloxicam with food if needed. Cortisone injections are an option - you were given this today. It's important that you continue to stay active. Straight leg raises, knee extensions 3 sets of 10 once a day (add ankle weight if these become too easy). Consider physical therapy to strengthen muscles around the joint that hurts to take pressure off of the joint itself. Shoe inserts with good arch support may be helpful. Heat or ice 15 minutes at a time 3-4 times a day as needed to help with pain. Water aerobics and cycling with low resistance are the best two types of exercise for arthritis though any exercise is ok as long as it doesn't worsen the pain. Follow up with me as needed.

## 2022-12-19 ENCOUNTER — Telehealth: Payer: Self-pay | Admitting: Internal Medicine

## 2022-12-19 ENCOUNTER — Other Ambulatory Visit: Payer: Self-pay | Admitting: Internal Medicine

## 2022-12-19 ENCOUNTER — Encounter: Payer: Self-pay | Admitting: Internal Medicine

## 2022-12-19 ENCOUNTER — Ambulatory Visit (INDEPENDENT_AMBULATORY_CARE_PROVIDER_SITE_OTHER): Payer: No Typology Code available for payment source | Admitting: Internal Medicine

## 2022-12-19 VITALS — BP 132/80 | HR 61 | Temp 98.2°F | Ht 62.0 in | Wt 232.0 lb

## 2022-12-19 DIAGNOSIS — E119 Type 2 diabetes mellitus without complications: Secondary | ICD-10-CM

## 2022-12-19 DIAGNOSIS — K219 Gastro-esophageal reflux disease without esophagitis: Secondary | ICD-10-CM

## 2022-12-19 DIAGNOSIS — E559 Vitamin D deficiency, unspecified: Secondary | ICD-10-CM | POA: Diagnosis not present

## 2022-12-19 DIAGNOSIS — I2510 Atherosclerotic heart disease of native coronary artery without angina pectoris: Secondary | ICD-10-CM | POA: Diagnosis not present

## 2022-12-19 LAB — LIPID PANEL
Cholesterol: 164 mg/dL (ref 0–200)
HDL: 37.3 mg/dL — ABNORMAL LOW (ref >39.00)
LDL Cholesterol: 99 mg/dL (ref 0–99)
NonHDL: 126.97
Total CHOL/HDL Ratio: 4
Triglycerides: 138 mg/dL (ref 0.0–149.0)
VLDL: 27.6 mg/dL (ref 0.0–40.0)

## 2022-12-19 LAB — COMPREHENSIVE METABOLIC PANEL
ALT: 15 U/L (ref 0–35)
AST: 16 U/L (ref 0–37)
Albumin: 4 g/dL (ref 3.5–5.2)
Alkaline Phosphatase: 56 U/L (ref 39–117)
BUN: 17 mg/dL (ref 6–23)
CO2: 29 mEq/L (ref 19–32)
Calcium: 8.5 mg/dL (ref 8.4–10.5)
Chloride: 104 mEq/L (ref 96–112)
Creatinine, Ser: 1.04 mg/dL (ref 0.40–1.20)
GFR: 55.19 mL/min — ABNORMAL LOW (ref >60.00)
Glucose, Bld: 77 mg/dL (ref 70–99)
Potassium: 3.7 mEq/L (ref 3.5–5.1)
Sodium: 140 mEq/L (ref 135–145)
Total Bilirubin: 0.5 mg/dL (ref 0.2–1.2)
Total Protein: 6.8 g/dL (ref 6.0–8.3)

## 2022-12-19 LAB — HEMOGLOBIN A1C: Hgb A1c MFr Bld: 5.9 % (ref 4.6–6.5)

## 2022-12-19 MED ORDER — OZEMPIC (2 MG/DOSE) 8 MG/3ML ~~LOC~~ SOPN: PEN_INJECTOR | SUBCUTANEOUS | 3 refills | Status: DC

## 2022-12-19 MED ORDER — SEMAGLUTIDE (2 MG/DOSE) 8 MG/3ML ~~LOC~~ SOPN: 2.0000 mg | PEN_INJECTOR | SUBCUTANEOUS | 3 refills | Status: DC

## 2022-12-19 NOTE — Progress Notes (Signed)
Subjective:  Patient ID: Jennifer Quinn, female    DOB: 03/20/1956  Age: 67 y.o. MRN: [id number]  CC: Knee Pain (Left Knee Pain)   HPI Jennifer Quinn presents for DM, depression, obesity  Outpatient Medications Prior to Visit  Medication Sig Dispense Refill   acetaminophen (TYLENOL) 500 MG tablet Take 500 mg by mouth every 6 (six) hours as needed.     albuterol (VENTOLIN HFA) 108 (90 Base) MCG/ACT inhaler Inhale 2 puffs into the lungs every 6 (six) hours as needed for wheezing or shortness of breath. 8 g 3   buPROPion (WELLBUTRIN) 100 MG tablet TAKE 1 TABLET BY MOUTH EVERY DAY 90 tablet 3   Cholecalciferol (VITAMIN D PO) Take 1,000 Units by mouth daily.      clonazePAM (KLONOPIN) 0.5 MG tablet SMARTSIG:1 Tablet(s) By Mouth     Fish Oil OIL Take 2,000 mg by mouth daily.     ibuprofen (ADVIL) 800 MG tablet Take 800 mg by mouth.     meloxicam (MOBIC) 15 MG tablet Take 15 mg by mouth daily.     metFORMIN (GLUCOPHAGE) 500 MG tablet Take 1 tablet (500 mg total) by mouth daily with breakfast. 90 tablet 3   Semaglutide, 1 MG/DOSE, 4 MG/3ML SOPN Inject 1 mg as directed once a week. 3 mL 3   amLODipine (NORVASC) 5 MG tablet Take 1 tablet (5 mg total) by mouth daily. 90 tablet 3   No facility-administered medications prior to visit.    ROS: Review of Systems  Constitutional:  Negative for activity change, appetite change, chills, fatigue and unexpected weight change.  HENT:  Negative for congestion, mouth sores and sinus pressure.   Eyes:  Negative for visual disturbance.  Respiratory:  Negative for cough and chest tightness.   Gastrointestinal:  Negative for abdominal pain and nausea.  Genitourinary:  Negative for difficulty urinating, frequency and vaginal pain.  Musculoskeletal:  Negative for back pain and gait problem.  Skin:  Negative for pallor and rash.  Neurological:  Negative for dizziness, tremors, weakness, numbness and headaches.  Psychiatric/Behavioral:  Negative for  confusion and sleep disturbance.     Objective:  BP 132/80 (BP Location: Right Arm, Patient Position: Sitting, Cuff Size: Large)   Pulse 61   Temp 98.2 F (36.8 C) (Oral)   Ht 5\' 2"  (1.575 m)   Wt 232 lb (105.2 kg)   SpO2 95%   BMI 42.43 kg/m   BP Readings from Last 3 Encounters:  12/19/22 132/80  12/14/22 138/80  10/04/22 (!) 144/92    Wt Readings from Last 3 Encounters:  12/19/22 232 lb (105.2 kg)  12/14/22 229 lb (103.9 kg)  06/21/22 243 lb 12.8 oz (110.6 kg)    Physical Exam Constitutional:      General: She is not in acute distress.    Appearance: She is well-developed. She is obese.  HENT:     Head: Normocephalic.     Right Ear: External ear normal.     Left Ear: External ear normal.     Nose: Nose normal.  Eyes:     General:        Right eye: No discharge.        Left eye: No discharge.     Conjunctiva/sclera: Conjunctivae normal.     Pupils: Pupils are equal, round, and reactive to light.  Neck:     Thyroid: No thyromegaly.     Vascular: No JVD.     Trachea: No tracheal deviation.  Cardiovascular:  Rate and Rhythm: Normal rate and regular rhythm.     Heart sounds: Normal heart sounds.  Pulmonary:     Effort: No respiratory distress.     Breath sounds: No stridor. No wheezing.  Abdominal:     General: Bowel sounds are normal. There is no distension.     Palpations: Abdomen is soft. There is no mass.     Tenderness: There is no abdominal tenderness. There is no guarding or rebound.  Musculoskeletal:        General: No tenderness.     Cervical back: Normal range of motion and neck supple. No rigidity.  Lymphadenopathy:     Cervical: No cervical adenopathy.  Skin:    Findings: No erythema or rash.  Neurological:     Cranial Nerves: No cranial nerve deficit.     Motor: No abnormal muscle tone.     Coordination: Coordination normal.     Deep Tendon Reflexes: Reflexes normal.  Psychiatric:        Behavior: Behavior normal.        Thought  Content: Thought content normal.        Judgment: Judgment normal.     Lab Results  Component Value Date   WBC 6.4 06/21/2022   HGB 13.1 06/21/2022   HCT 40.0 06/21/2022   PLT 244.0 06/21/2022   GLUCOSE 94 06/21/2022   CHOL 213 (H) 06/21/2022   TRIG 177.0 (H) 06/21/2022   HDL 41.30 06/21/2022   LDLDIRECT 157.0 05/09/2018   LDLCALC 137 (H) 06/21/2022   ALT 11 06/21/2022   AST 15 06/21/2022   NA 137 06/21/2022   K 3.7 06/21/2022   CL 99 06/21/2022   CREATININE 0.93 06/21/2022   BUN 15 06/21/2022   CO2 31 06/21/2022   TSH 2.12 06/21/2022   HGBA1C 6.4 06/21/2022    MM 3D SCREEN BREAST BILATERAL  Result Date: 09/30/2022 CLINICAL DATA:  Screening. EXAM: DIGITAL SCREENING BILATERAL MAMMOGRAM WITH TOMOSYNTHESIS AND CAD TECHNIQUE: Bilateral screening digital craniocaudal and mediolateral oblique mammograms were obtained. Bilateral screening digital breast tomosynthesis was performed. The images were evaluated with computer-aided detection. COMPARISON:  Previous exam(s). ACR Breast Density Category b: There are scattered areas of fibroglandular density. FINDINGS: There are no findings suspicious for malignancy. IMPRESSION: No mammographic evidence of malignancy. A result letter of this screening mammogram will be mailed directly to the patient. RECOMMENDATION: Screening mammogram in one year. (Code:SM-B-01Y) BI-RADS CATEGORY  1: Negative. Electronically Signed   By: Elberta Fortis M.D.   On: 09/30/2022 11:27    Assessment & Plan:   Problem List Items Addressed This Visit     Vitamin D deficiency    On Vit D      GERD (gastroesophageal reflux disease)    On  Dexilant      Relevant Orders   Comprehensive metabolic panel   Hemoglobin A1c   Lipid panel   Diabetes (HCC) - Primary    Cont Metformin Increase Ozempic dose      Relevant Medications   Semaglutide, 2 MG/DOSE, 8 MG/3ML SOPN   Other Relevant Orders   Comprehensive metabolic panel   Hemoglobin A1c   Lipid panel    Coronary atherosclerosis    Start on fish oil      Relevant Orders   Comprehensive metabolic panel   Hemoglobin A1c   Lipid panel      Meds ordered this encounter  Medications   Semaglutide, 2 MG/DOSE, 8 MG/3ML SOPN    Sig: Inject 2 mg as  directed once a week.    Dispense:  3 mL    Refill:  3      Follow-up: Return in about 3 months (around 03/20/2023) for a follow-up visit.  Sonda Primes, MD

## 2022-12-19 NOTE — Assessment & Plan Note (Signed)
Cont Metformin Increase Ozempic dose

## 2022-12-19 NOTE — Assessment & Plan Note (Signed)
On Dexilant 

## 2022-12-19 NOTE — Telephone Encounter (Signed)
Pt is needing PA on Ozempic.Marland KitchenRaechel Chute

## 2022-12-19 NOTE — Assessment & Plan Note (Signed)
Start on fish oil

## 2022-12-19 NOTE — Telephone Encounter (Signed)
Resent Ozempic to [facility].Marland KitchenRaechel Quinn

## 2022-12-19 NOTE — Assessment & Plan Note (Signed)
On Wellbutrin 

## 2022-12-19 NOTE — Telephone Encounter (Signed)
Patient states she needs her prescription for ozempic to be sent to the [facility] in [facility], [address].

## 2022-12-19 NOTE — Assessment & Plan Note (Signed)
On Vit D 

## 2022-12-19 NOTE — Telephone Encounter (Signed)
Started to do PA received msg that med was already approved. Effective 09/29/22 through 06/25/23. Hane pharmacist to resubmit claim. Sent pt a Radio broadcast assistant of PA.Marland KitchenRaechel Chute

## 2022-12-22 NOTE — Telephone Encounter (Addendum)
Rec'd fax pt need PA on Ozempic. Called [phone] spoke w/rep med was for cost exemption. She states have the pharmacy to run med again they shouldn't have a problem..Sent pt msg via mychart w/ PA response.Marland KitchenRaechel Chute

## 2022-12-28 ENCOUNTER — Other Ambulatory Visit (HOSPITAL_COMMUNITY): Payer: Self-pay

## 2023-01-18 ENCOUNTER — Encounter: Payer: Self-pay | Admitting: Family Medicine

## 2023-01-20 ENCOUNTER — Ambulatory Visit
Admission: RE | Admit: 2023-01-20 | Discharge: 2023-01-20 | Disposition: A | Discharge status: Home / Self Care | Payer: No Typology Code available for payment source | Source: Ambulatory Visit | Attending: Family Medicine | Admitting: Family Medicine

## 2023-01-20 ENCOUNTER — Other Ambulatory Visit: Payer: Self-pay | Admitting: *Deleted

## 2023-01-20 DIAGNOSIS — G8929 Other chronic pain: Secondary | ICD-10-CM

## 2023-01-20 NOTE — Telephone Encounter (Signed)
Can you place an order for standing x-rays of her left knee and let her know how to get them?  I'll call her with results and next step.  Thanks!

## 2023-01-27 ENCOUNTER — Encounter: Payer: Self-pay | Admitting: Family Medicine

## 2023-01-30 ENCOUNTER — Other Ambulatory Visit: Payer: Self-pay | Admitting: *Deleted

## 2023-01-30 DIAGNOSIS — M1712 Unilateral primary osteoarthritis, left knee: Secondary | ICD-10-CM | POA: Insufficient documentation

## 2023-03-12 ENCOUNTER — Other Ambulatory Visit: Payer: Self-pay | Admitting: Internal Medicine

## 2023-03-12 DIAGNOSIS — F3289 Other specified depressive episodes: Secondary | ICD-10-CM

## 2023-04-20 ENCOUNTER — Ambulatory Visit (INDEPENDENT_AMBULATORY_CARE_PROVIDER_SITE_OTHER): Payer: No Typology Code available for payment source | Admitting: Internal Medicine

## 2023-04-20 ENCOUNTER — Encounter: Payer: Self-pay | Admitting: Internal Medicine

## 2023-04-20 DIAGNOSIS — Z6841 Body Mass Index (BMI) 40.0 and over, adult: Secondary | ICD-10-CM | POA: Diagnosis not present

## 2023-04-20 DIAGNOSIS — N183 Chronic kidney disease, stage 3 unspecified: Secondary | ICD-10-CM

## 2023-04-20 DIAGNOSIS — I2510 Atherosclerotic heart disease of native coronary artery without angina pectoris: Secondary | ICD-10-CM | POA: Diagnosis not present

## 2023-04-20 DIAGNOSIS — E119 Type 2 diabetes mellitus without complications: Secondary | ICD-10-CM | POA: Diagnosis not present

## 2023-04-20 DIAGNOSIS — Z7985 Long-term (current) use of injectable non-insulin antidiabetic drugs: Secondary | ICD-10-CM

## 2023-04-20 DIAGNOSIS — Z7984 Long term (current) use of oral hypoglycemic drugs: Secondary | ICD-10-CM

## 2023-04-20 MED ORDER — OZEMPIC (2 MG/DOSE) 8 MG/3ML ~~LOC~~ SOPN: PEN_INJECTOR | SUBCUTANEOUS | 3 refills | Status: DC

## 2023-04-20 NOTE — Assessment & Plan Note (Signed)
Diet, fasting On Ozempic lost from 244 lbs Better

## 2023-04-20 NOTE — Progress Notes (Addendum)
Subjective:  Patient ID: Jennifer Quinn, female    DOB: [birth year]  Age: 67 y.o. MRN: [id number]  CC: No chief complaint on file.   HPI Jennifer Quinn presents for DM, obesity, CAD  Outpatient Medications Prior to Visit  Medication Sig Dispense Refill   acetaminophen (TYLENOL) 500 MG tablet Take 500 mg by mouth every 6 (six) hours as needed.     albuterol (VENTOLIN HFA) 108 (90 Base) MCG/ACT inhaler Inhale 2 puffs into the lungs every 6 (six) hours as needed for wheezing or shortness of breath. 8 g 3   buPROPion (WELLBUTRIN) 100 MG tablet Take 1 tablet (100 mg total) by mouth daily. Annual appt due in Oct must see provider for future refills 90 tablet 0   Cholecalciferol (VITAMIN D PO) Take 1,000 Units by mouth daily.      clonazePAM (KLONOPIN) 0.5 MG tablet SMARTSIG:1 Tablet(s) By Mouth     Fish Oil OIL Take 2,000 mg by mouth daily.     ibuprofen (ADVIL) 800 MG tablet Take 800 mg by mouth.     meloxicam (MOBIC) 15 MG tablet Take 15 mg by mouth daily.     metFORMIN (GLUCOPHAGE) 500 MG tablet Take 1 tablet (500 mg total) by mouth daily with breakfast. 90 tablet 3   Semaglutide, 2 MG/DOSE, (OZEMPIC, 2 MG/DOSE,) 8 MG/3ML SOPN INJECT 2 MG AS DIRECTED ONCE A WEEK. 3 mL 3   amLODipine (NORVASC) 5 MG tablet Take 1 tablet (5 mg total) by mouth daily. 90 tablet 3   No facility-administered medications prior to visit.    ROS: Review of Systems  Constitutional:  Negative for activity change, appetite change, chills, fatigue and unexpected weight change.  HENT:  Negative for congestion, mouth sores and sinus pressure.   Eyes:  Negative for visual disturbance.  Respiratory:  Negative for cough and chest tightness.   Gastrointestinal:  Negative for abdominal pain and nausea.  Genitourinary:  Negative for difficulty urinating, frequency and vaginal pain.  Musculoskeletal:  Negative for back pain and gait problem.  Skin:  Negative for pallor and rash.  Neurological:  Negative for dizziness,  tremors, weakness, numbness and headaches.  Psychiatric/Behavioral:  Negative for confusion and sleep disturbance.     Objective:  BP 138/84 (BP Location: Right Arm, Patient Position: Sitting, Cuff Size: Large)   Pulse 67   Temp (!) 97.5 F (36.4 C) (Oral)   Ht 5\' 2"  (1.575 m)   Wt 227 lb (103 kg)   SpO2 96%   BMI 41.52 kg/m   BP Readings from Last 3 Encounters:  04/20/23 138/84  12/19/22 132/80  12/14/22 138/80    Wt Readings from Last 3 Encounters:  04/20/23 227 lb (103 kg)  12/19/22 232 lb (105.2 kg)  12/14/22 229 lb (103.9 kg)    Physical Exam Constitutional:      General: She is not in acute distress.    Appearance: She is well-developed. She is obese.  HENT:     Head: Normocephalic.     Right Ear: External ear normal.     Left Ear: External ear normal.     Nose: Nose normal.  Eyes:     General:        Right eye: No discharge.        Left eye: No discharge.     Conjunctiva/sclera: Conjunctivae normal.     Pupils: Pupils are equal, round, and reactive to light.  Neck:     Thyroid: No thyromegaly.  Vascular: No JVD.     Trachea: No tracheal deviation.  Cardiovascular:     Rate and Rhythm: Normal rate and regular rhythm.     Heart sounds: Normal heart sounds.  Pulmonary:     Effort: No respiratory distress.     Breath sounds: No stridor. No wheezing.  Abdominal:     General: Bowel sounds are normal. There is no distension.     Palpations: Abdomen is soft. There is no mass.     Tenderness: There is no abdominal tenderness. There is no guarding or rebound.  Musculoskeletal:        General: No tenderness.     Cervical back: Normal range of motion and neck supple. No rigidity.  Lymphadenopathy:     Cervical: No cervical adenopathy.  Skin:    Findings: No erythema or rash.  Neurological:     Cranial Nerves: No cranial nerve deficit.     Motor: No abnormal muscle tone.     Coordination: Coordination normal.     Deep Tendon Reflexes: Reflexes normal.   Psychiatric:        Behavior: Behavior normal.        Thought Content: Thought content normal.        Judgment: Judgment normal.     Lab Results  Component Value Date   WBC 6.4 06/21/2022   HGB 13.1 06/21/2022   HCT 40.0 06/21/2022   PLT 244.0 06/21/2022   GLUCOSE 97 04/25/2023   CHOL 164 12/19/2022   TRIG 138.0 12/19/2022   HDL 37.30 (L) 12/19/2022   LDLDIRECT 157.0 05/09/2018   LDLCALC 99 12/19/2022   ALT 12 04/25/2023   AST 15 04/25/2023   NA 141 04/25/2023   K 3.9 04/25/2023   CL 103 04/25/2023   CREATININE 1.14 04/25/2023   BUN 15 04/25/2023   CO2 30 04/25/2023   TSH 2.12 06/21/2022   HGBA1C 5.8 04/25/2023    DG Knee AP/LAT W/Sunrise Left  Result Date: 01/24/2023 CLINICAL DATA:  Knee pain for 3 weeks EXAM: LEFT KNEE 3 VIEWS COMPARISON:  None Available. FINDINGS: Moderate to severe tricompartmental degenerative changes. No fracture or effusion. No other abnormalities. IMPRESSION: Moderate to severe tricompartmental degenerative changes. Electronically Signed   By: Gerome Sam III M.D.   On: 01/24/2023 10:18    Assessment & Plan:   Problem List Items Addressed This Visit     OBESITY, MORBID - Primary    Diet, fasting On Ozempic lost from 244 lbs Better      Relevant Medications   Semaglutide, 2 MG/DOSE, (OZEMPIC, 2 MG/DOSE,) 8 MG/3ML SOPN   Diabetes (HCC)    GFR is decreased at 49.31 -hydrate yourself well.  We can start either one of 2 medications (Farxiga or [place]) to protect kidneys better.  Cont Metformin On Ozempic      Relevant Medications   Semaglutide, 2 MG/DOSE, (OZEMPIC, 2 MG/DOSE,) 8 MG/3ML SOPN   Other Relevant Orders   Hemoglobin A1c (Completed)   Comprehensive metabolic panel (Completed)   Coronary atherosclerosis    Coronary calcium CT score is 6.51. On fish oil      Relevant Orders   Comprehensive metabolic panel (Completed)   CRI (chronic renal insufficiency), stage 3 (moderate) (HCC)    GFR is decreased at 49.31 -hydrate  yourself well.  We can start either one of 2 medications (Farxiga or [place]) to protect kidneys better.          Meds ordered this encounter  Medications   Semaglutide, 2  MG/DOSE, (OZEMPIC, 2 MG/DOSE,) 8 MG/3ML SOPN    Sig: INJECT 2 MG AS DIRECTED ONCE A WEEK.    Dispense:  3 mL    Refill:  3      Follow-up: Return in about 4 months (around 08/20/2023) for a follow-up visit.  Sonda Primes, MD

## 2023-04-20 NOTE — Assessment & Plan Note (Signed)
Coronary calcium CT score is 6.51. On fish oil

## 2023-04-25 LAB — COMPREHENSIVE METABOLIC PANEL
ALT: 12 U/L (ref 0–35)
AST: 15 U/L (ref 0–37)
Albumin: 3.9 g/dL (ref 3.5–5.2)
Alkaline Phosphatase: 66 U/L (ref 39–117)
BUN: 15 mg/dL (ref 6–23)
CO2: 30 meq/L (ref 19–32)
Calcium: 9.3 mg/dL (ref 8.4–10.5)
Chloride: 103 meq/L (ref 96–112)
Creatinine, Ser: 1.14 mg/dL (ref 0.40–1.20)
GFR: 49.31 mL/min — ABNORMAL LOW (ref >60.00)
Glucose, Bld: 97 mg/dL (ref 70–99)
Potassium: 3.9 meq/L (ref 3.5–5.1)
Sodium: 141 meq/L (ref 135–145)
Total Bilirubin: 0.8 mg/dL (ref 0.2–1.2)
Total Protein: 6.8 g/dL (ref 6.0–8.3)

## 2023-04-25 LAB — HEMOGLOBIN A1C: Hgb A1c MFr Bld: 5.8 % (ref 4.6–6.5)

## 2023-04-26 ENCOUNTER — Encounter: Payer: Self-pay | Admitting: Internal Medicine

## 2023-04-26 DIAGNOSIS — N183 Chronic kidney disease, stage 3 unspecified: Secondary | ICD-10-CM | POA: Insufficient documentation

## 2023-04-26 NOTE — Assessment & Plan Note (Signed)
GFR is decreased at 49.31 -hydrate yourself well.  We can start either one of 2 medications (Farxiga or [place]) to protect kidneys better.

## 2023-04-26 NOTE — Assessment & Plan Note (Signed)
GFR is decreased at 49.31 -hydrate yourself well.  We can start either one of 2 medications (Farxiga or [place]) to protect kidneys better.  Cont Metformin On Ozempic

## 2023-05-05 ENCOUNTER — Encounter: Payer: Self-pay | Admitting: Family Medicine

## 2023-05-09 ENCOUNTER — Other Ambulatory Visit: Payer: No Typology Code available for payment source | Admitting: Nurse Practitioner

## 2023-05-09 DIAGNOSIS — N183 Chronic kidney disease, stage 3 unspecified: Secondary | ICD-10-CM

## 2023-05-09 NOTE — Progress Notes (Signed)
Patient presents for labs today. Tolerated well.

## 2023-05-10 ENCOUNTER — Ambulatory Visit (INDEPENDENT_AMBULATORY_CARE_PROVIDER_SITE_OTHER): Payer: No Typology Code available for payment source | Admitting: Sports Medicine

## 2023-05-10 VITALS — BP 138/84 | Ht 62.0 in | Wt 225.0 lb

## 2023-05-10 DIAGNOSIS — M1712 Unilateral primary osteoarthritis, left knee: Secondary | ICD-10-CM | POA: Diagnosis not present

## 2023-05-10 MED ORDER — METHYLPREDNISOLONE ACETATE 40 MG/ML IJ SUSP
40.0000 mg | Freq: Once | INTRAMUSCULAR | Status: AC
Start: 2023-05-10 — End: 2023-05-10
  Administered 2023-05-10: 40 mg via INTRA_ARTICULAR

## 2023-05-10 NOTE — Progress Notes (Signed)
PCP: Plotnikov, Georgina Quint, MD  SUBJECTIVE:   HPI:  Patient is a 67 y.o. female here with chief complaint of left knee pain requesting another injection. She has chronic left knee pain previously tx'd with cortisone injections with relief, most recently here in 11/2022. She notes this last injection provided her relief until about the past month or so. Pain primarily in anterolateral aspect of the knee. Worse with prolonged standing and activity. 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   ROS:     See HPI  PERTINENT  PMH / PSH FH / / SH:  Past Medical, Surgical, Social, and Family History Reviewed & Updated in the EMR.  Pertinent findings include:  OA  No Known Allergies   OBJECTIVE:  BP 138/84   Ht 5\' 2"  (1.575 m)   Wt 225 lb (102.1 kg)   BMI 41.15 kg/m   PHYSICAL EXAM:  GEN: Alert and Oriented, NAD, comfortable in exam room RESP: Unlabored respirations, symmetric chest rise PSY: normal mood, congruent affect   Left Knee MSK EXAM: No gross deformity, ecchymoses, swelling. TTP along bilateral joint lines, otherwise non-tender FROM with normal strength. Negative ant/post drawers. Negative valgus/varus testing. Negative lachman.  Negative mcmurrays, apleys.  NV intact distally.    ASSESSMENT & PLAN:  1. Primary osteoarthritis of left knee Acute on chronic issue. Steroid injection provided today as described below. Reviewed additional conservative management with OTC pain relief. She will f/u as needed.  Left Knee Injection Procedure: After informed written consent timeout was performed, patient was in seated position on exam table.  Left knee was prepped with alcohol swab x2. Ethyl chloride spray used for topical anesthetic. Utilizing anterolateral approach and a 25g needle, the left knee joint was injected with 3:1 lidocaine:depomedrol. Following the injection a bandage was applied to the area. Patient tolerated procedure well without immediate complications. The  patient was counseled as to the expected post-injection course, including the possibility of worsening of pain with steroid flare. Instructed as to concerning symptoms and advised to contact the office if these should arise.    Glean Salen, MD PGY-4, Sports Medicine Fellow [facility]  Addendum:  Patient seen in the office by fellow.  His history, exam, plan of care were precepted with me.  Norton Blizzard MD Marrianne Mood

## 2023-05-23 ENCOUNTER — Other Ambulatory Visit (HOSPITAL_COMMUNITY): Payer: Self-pay

## 2023-05-23 MED ORDER — INFLUENZA VAC A&B SURF ANT ADJ 0.5 ML IM SUSY
0.5000 mL | PREFILLED_SYRINGE | Freq: Once | INTRAMUSCULAR | 0 refills | Status: AC
  Filled 2023-05-23: qty 0.5, 1d supply, fill #0

## 2023-06-07 ENCOUNTER — Other Ambulatory Visit (HOSPITAL_COMMUNITY): Payer: Self-pay

## 2023-07-21 ENCOUNTER — Other Ambulatory Visit (HOSPITAL_COMMUNITY): Payer: Self-pay

## 2023-07-21 ENCOUNTER — Telehealth: Payer: Self-pay

## 2023-07-21 NOTE — Telephone Encounter (Signed)
Pharmacy Patient Advocate Encounter   Received notification from CoverMyMeds that prior authorization for Ozempic is required/requested.   Insurance verification completed.   The patient is insured through [employer] .   Per test claim: PA required; PA submitted to above mentioned insurance via CoverMyMeds Key/confirmation #/EOC Key: [facility] Status is pending

## 2023-07-25 ENCOUNTER — Encounter: Payer: Self-pay | Admitting: Internal Medicine

## 2023-07-27 NOTE — Telephone Encounter (Signed)
Pharmacy Patient Advocate Encounter  Received notification from [employer] that Prior Authorization for Ozempic has been DENIED.  Full denial letter will be uploaded to the media tab. See denial reason below.  Pt insurance has stated the message below:   PA #/Case ID/Reference #: Key: [id number]   Please note baseline A1C labs and chartnotes were attached with this Prior Authorization.

## 2023-08-07 ENCOUNTER — Other Ambulatory Visit: Payer: Self-pay | Admitting: Internal Medicine

## 2023-08-07 DIAGNOSIS — I10 Essential (primary) hypertension: Secondary | ICD-10-CM

## 2023-08-24 ENCOUNTER — Encounter: Payer: Self-pay | Admitting: Internal Medicine

## 2023-08-24 ENCOUNTER — Telehealth: Payer: Self-pay | Admitting: Pharmacy Technician

## 2023-08-24 ENCOUNTER — Ambulatory Visit: Payer: No Typology Code available for payment source | Admitting: Internal Medicine

## 2023-08-24 ENCOUNTER — Other Ambulatory Visit (HOSPITAL_COMMUNITY): Payer: Self-pay

## 2023-08-24 DIAGNOSIS — E119 Type 2 diabetes mellitus without complications: Secondary | ICD-10-CM

## 2023-08-24 DIAGNOSIS — Z7985 Long-term (current) use of injectable non-insulin antidiabetic drugs: Secondary | ICD-10-CM

## 2023-08-24 DIAGNOSIS — N183 Chronic kidney disease, stage 3 unspecified: Secondary | ICD-10-CM

## 2023-08-24 LAB — COMPREHENSIVE METABOLIC PANEL
ALT: 11 U/L (ref 0–35)
AST: 14 U/L (ref 0–37)
Albumin: 4.1 g/dL (ref 3.5–5.2)
Alkaline Phosphatase: 66 U/L (ref 39–117)
BUN: 15 mg/dL (ref 6–23)
CO2: 30 meq/L (ref 19–32)
Calcium: 9.1 mg/dL (ref 8.4–10.5)
Chloride: 103 meq/L (ref 96–112)
Creatinine, Ser: 1 mg/dL (ref 0.40–1.20)
GFR: 57.57 mL/min — ABNORMAL LOW (ref >60.00)
Glucose, Bld: 96 mg/dL (ref 70–99)
Potassium: 4.2 meq/L (ref 3.5–5.1)
Sodium: 141 meq/L (ref 135–145)
Total Bilirubin: 0.5 mg/dL (ref 0.2–1.2)
Total Protein: 6.9 g/dL (ref 6.0–8.3)

## 2023-08-24 LAB — HEMOGLOBIN A1C: Hgb A1c MFr Bld: 5.9 % (ref 4.6–6.5)

## 2023-08-24 MED ORDER — OZEMPIC (0.25 OR 0.5 MG/DOSE) 2 MG/3ML ~~LOC~~ SOPN: PEN_INJECTOR | SUBCUTANEOUS | 3 refills | Status: DC

## 2023-08-24 NOTE — Assessment & Plan Note (Signed)
GFR is decreased at 49.31 -hydrate yourself well.  We can start either one of 2 medications (Farxiga or [place]) to protect kidneys better.

## 2023-08-24 NOTE — Telephone Encounter (Signed)
 Pharmacy Patient Advocate Encounter   Received notification from CoverMyMeds that prior authorization for Ozempic  (0.25 or 0.5 MG/DOSE) 2MG /3ML pen-injectors is required/requested.   Insurance verification completed.   The patient is insured through [employer] .   Per test claim: PA required; PA submitted to above mentioned insurance via Fax Key/confirmation #/EOC Key: [id number] Status is pending

## 2023-08-24 NOTE — Progress Notes (Signed)
 Subjective:  Patient ID: Jennifer Quinn, female    DOB: 05-20-1956  Age: 68 y.o. MRN: [id number]  CC: Weight Check (Discuss weight gain and going back on Ozempic )   HPI Jennifer Quinn presents for DM, HTN, depression  Outpatient Medications Prior to Visit  Medication Sig Dispense Refill   acetaminophen  (TYLENOL ) 500 MG tablet Take 500 mg by mouth every 6 (six) hours as needed.     albuterol  (VENTOLIN  HFA) 108 (90 Base) MCG/ACT inhaler Inhale 2 puffs into the lungs every 6 (six) hours as needed for wheezing or shortness of breath. 8 g 3   amLODipine  (NORVASC ) 5 MG tablet TAKE 1 TABLET (5 MG TOTAL) BY MOUTH DAILY. 90 tablet 3   buPROPion  (WELLBUTRIN ) 100 MG tablet Take 1 tablet (100 mg total) by mouth daily. Annual appt due in Oct must see provider for future refills 90 tablet 0   Cholecalciferol (VITAMIN D  PO) Take 1,000 Units by mouth daily.      clonazePAM (KLONOPIN) 0.5 MG tablet SMARTSIG:1 Tablet(s) By Mouth     Fish Oil OIL Take 2,000 mg by mouth daily.     ibuprofen  (ADVIL ) 800 MG tablet Take 800 mg by mouth.     meloxicam  (MOBIC ) 15 MG tablet Take 15 mg by mouth daily.     metFORMIN  (GLUCOPHAGE ) 500 MG tablet Take 1 tablet (500 mg total) by mouth daily with breakfast. 90 tablet 3   Semaglutide , 2 MG/DOSE, (OZEMPIC , 2 MG/DOSE,) 8 MG/3ML SOPN INJECT 2 MG AS DIRECTED ONCE A WEEK. 3 mL 3   No facility-administered medications prior to visit.    ROS: Review of Systems  Constitutional:  Negative for activity change, appetite change, chills, fatigue and unexpected weight change.  HENT:  Negative for congestion, mouth sores and sinus pressure.   Eyes:  Negative for visual disturbance.  Respiratory:  Negative for cough and chest tightness.   Gastrointestinal:  Negative for abdominal pain and nausea.  Genitourinary:  Negative for difficulty urinating, frequency and vaginal pain.  Musculoskeletal:  Negative for back pain and gait problem.  Skin:  Negative for pallor and rash.   Neurological:  Negative for dizziness, tremors, weakness, numbness and headaches.  Psychiatric/Behavioral:  Negative for confusion and sleep disturbance.     Objective:  BP 118/70 (BP Location: Left Arm, Patient Position: Sitting, Cuff Size: Normal)   Pulse 66   Temp 98.3 F (36.8 C) (Oral)   Ht 5' 2 (1.575 m)   Wt 234 lb (106.1 kg)   SpO2 97%   BMI 42.80 kg/m   BP Readings from Last 3 Encounters:  08/24/23 118/70  05/10/23 138/84  04/20/23 138/84    Wt Readings from Last 3 Encounters:  08/24/23 234 lb (106.1 kg)  05/10/23 225 lb (102.1 kg)  04/20/23 227 lb (103 kg)    Physical Exam Constitutional:      General: She is not in acute distress.    Appearance: She is well-developed. She is obese.  HENT:     Head: Normocephalic.     Right Ear: External ear normal.     Left Ear: External ear normal.     Nose: Nose normal.  Eyes:     General:        Right eye: No discharge.        Left eye: No discharge.     Conjunctiva/sclera: Conjunctivae normal.     Pupils: Pupils are equal, round, and reactive to light.  Neck:     Thyroid : No  thyromegaly.     Vascular: No JVD.     Trachea: No tracheal deviation.  Cardiovascular:     Rate and Rhythm: Normal rate and regular rhythm.     Heart sounds: Normal heart sounds.  Pulmonary:     Effort: No respiratory distress.     Breath sounds: No stridor. No wheezing.  Abdominal:     General: Bowel sounds are normal. There is no distension.     Palpations: Abdomen is soft. There is no mass.     Tenderness: There is no abdominal tenderness. There is no guarding or rebound.  Musculoskeletal:        General: No tenderness.     Cervical back: Normal range of motion and neck supple. No rigidity.  Lymphadenopathy:     Cervical: No cervical adenopathy.  Skin:    Findings: No erythema or rash.  Neurological:     Mental Status: She is oriented to person, place, and time.     Cranial Nerves: No cranial nerve deficit.     Motor: No  abnormal muscle tone.     Coordination: Coordination normal.     Deep Tendon Reflexes: Reflexes normal.  Psychiatric:        Behavior: Behavior normal.        Thought Content: Thought content normal.        Judgment: Judgment normal.     Lab Results  Component Value Date   WBC 6.4 06/21/2022   HGB 13.1 06/21/2022   HCT 40.0 06/21/2022   PLT 244.0 06/21/2022   GLUCOSE 90 05/09/2023   CHOL 164 12/19/2022   TRIG 138.0 12/19/2022   HDL 37.30 (L) 12/19/2022   LDLDIRECT 157.0 05/09/2018   LDLCALC 99 12/19/2022   ALT 12 04/25/2023   AST 15 04/25/2023   NA 138 05/09/2023   K 4.1 05/09/2023   CL 103 05/09/2023   CREATININE 0.96 05/09/2023   BUN 13 05/09/2023   CO2 26 05/09/2023   TSH 2.12 06/21/2022   HGBA1C 5.8 04/25/2023    DG Knee AP/LAT W/Sunrise Left Result Date: 01/24/2023 CLINICAL DATA:  Knee pain for 3 weeks EXAM: LEFT KNEE 3 VIEWS COMPARISON:  None Available. FINDINGS: Moderate to severe tricompartmental degenerative changes. No fracture or effusion. No other abnormalities. IMPRESSION: Moderate to severe tricompartmental degenerative changes. Electronically Signed   By: Alm Pouch III M.D.   On: 01/24/2023 10:18    Assessment & Plan:   Problem List Items Addressed This Visit     OBESITY, MORBID - Primary   Diet, fasting On Ozempic  lost from 244 lbs Better      Relevant Medications   Semaglutide ,0.25 or 0.5MG /DOS, (OZEMPIC , 0.25 OR 0.5 MG/DOSE,) 2 MG/3ML SOPN   Diabetes (HCC)   Off Ozempic  x 2 mo due to Insurance decision to stop it Will re-start at low dose again      Relevant Medications   Semaglutide ,0.25 or 0.5MG /DOS, (OZEMPIC , 0.25 OR 0.5 MG/DOSE,) 2 MG/3ML SOPN   Other Relevant Orders   Comprehensive metabolic panel   Hemoglobin A1c   CRI (chronic renal insufficiency), stage 3 (moderate) (HCC)   GFR is decreased at 49.31 -hydrate yourself well.  We can start either one of 2 medications (Farxiga or Kerendia) to protect kidneys better.        Relevant Orders   Comprehensive metabolic panel   Hemoglobin A1c      Meds ordered this encounter  Medications   Semaglutide ,0.25 or 0.5MG /DOS, (OZEMPIC , 0.25 OR 0.5 MG/DOSE,) 2 MG/3ML SOPN  Sig: Use 0.25 mg weekly sq for 1 month, then 0.5 mg sq weekly    Dispense:  3 mL    Refill:  3      Follow-up: No follow-ups on file.  Marolyn Noel, MD

## 2023-08-24 NOTE — Assessment & Plan Note (Addendum)
 Off Ozempic x 2 mo due to Insurance decision to stop it Will re-start at low dose again

## 2023-08-24 NOTE — Assessment & Plan Note (Signed)
Diet, fasting On Ozempic lost from 244 lbs Better

## 2023-08-25 NOTE — Telephone Encounter (Signed)
 Pharmacy Patient Advocate Encounter  Received notification from [facility] [employer] that Prior Authorization for Ozempic  (0.25 or 0.5 MG/DOSE) 2MG /3ML pen-injectors has been DENIED.  Full denial letter will be uploaded to the media tab. See denial reason below.   PA #/Case ID/Reference #: Key: [id number]

## 2023-08-27 ENCOUNTER — Encounter: Payer: Self-pay | Admitting: Internal Medicine

## 2023-08-28 ENCOUNTER — Other Ambulatory Visit: Payer: Self-pay

## 2023-08-28 MED ORDER — OZEMPIC (0.25 OR 0.5 MG/DOSE) 2 MG/3ML ~~LOC~~ SOPN: PEN_INJECTOR | SUBCUTANEOUS | 3 refills | Status: DC

## 2023-08-29 ENCOUNTER — Other Ambulatory Visit: Payer: Self-pay

## 2023-08-29 DIAGNOSIS — R7303 Prediabetes: Secondary | ICD-10-CM

## 2023-08-29 MED ORDER — METFORMIN HCL 500 MG PO TABS: 500.0000 mg | ORAL_TABLET | Freq: Every day | ORAL | 3 refills | Status: AC

## 2023-08-29 MED ORDER — POTASSIUM CHLORIDE CRYS ER 20 MEQ PO TBCR: 20.0000 meq | EXTENDED_RELEASE_TABLET | Freq: Every day | ORAL | 3 refills | Status: AC

## 2023-08-30 ENCOUNTER — Ambulatory Visit: Payer: No Typology Code available for payment source | Admitting: Sports Medicine

## 2023-08-30 VITALS — BP 159/76 | Ht 62.0 in | Wt 234.0 lb

## 2023-08-30 DIAGNOSIS — M1712 Unilateral primary osteoarthritis, left knee: Secondary | ICD-10-CM | POA: Diagnosis not present

## 2023-08-30 MED ORDER — METHYLPREDNISOLONE ACETATE 40 MG/ML IJ SUSP
40.0000 mg | Freq: Once | INTRAMUSCULAR | Status: AC
  Administered 2023-08-30: 40 mg via INTRA_ARTICULAR

## 2023-08-30 NOTE — Progress Notes (Signed)
   PCP: Plotnikov, Karlynn GAILS, MD  SUBJECTIVE:   HPI:  Patient is a 68 y.o. female here for follow-up left knee pain requesting another injection. She has chronic left knee pain previously tx'd with cortisone injections with relief, most recently here in 04/2023. She notes this last injection provided her relief until about the middle of last month. Pain primarily in anterolateral aspect of the knee. Worse with prolonged standing and activity. She works as a [employer] and is regularly on her feet, but pain has not limited her ability to work.    ROS:     See HPI  PERTINENT  PMH / PSH FH / / SH:  Past Medical, Surgical, Social, and Family History Reviewed & Updated in the EMR.  Pertinent findings include:  OA  No Known Allergies   OBJECTIVE:  BP (!) 149/73   Ht 5' 2 (1.575 m)   Wt 234 lb (106.1 kg)   BMI 42.80 kg/m   PHYSICAL EXAM:  GEN: Alert and Oriented, NAD, comfortable in exam room RESP: Unlabored respirations, symmetric chest rise PSY: normal mood, congruent affect   Left Knee MSK EXAM: No gross deformity, ecchymoses, swelling. TTP along bilateral joint lines, otherwise non-tender FROM with normal strength. Notable crepitus.  Negative ant/post drawers. Negative valgus/varus testing. Negative lachman.  Negative mcmurrays, apleys.  NV intact distally.   ASSESSMENT & PLAN:  1. Primary osteoarthritis of left knee Acute on chronic issue. Steroid injection provided today as described below. Reviewed additional conservative management with OTC pain relief. She will f/u as needed. Discussed option for knee replacement versus continued CSI therapy. Patient elected to continue CSI therapy at this time, but may consider replacement if CSI cease to alleviate pain.   Left Knee Injection Procedure: After informed written consent timeout was performed, patient was in seated position on exam table.  Left knee was prepped with alcohol swab x2. Ethyl chloride spray used for topical anesthetic.  Utilizing anterolateral approach and a 25g needle, the left knee joint was injected with 3:1 lidocaine :depomedrol. Following the injection a bandage was applied to the area. Patient tolerated procedure well without immediate complications. The patient was counseled as to the expected post-injection course, including the possibility of worsening of pain with steroid flare. Instructed as to concerning symptoms and advised to contact the office if these should arise.   Patient seen and evaluated by, Delene Corners, MS4.  I have seen and examined the patient independently and agree with their assessment as above. I performed the above procedure.  Prentice Niece, MD PGY-4, Sports Medicine Fellow [facility]

## 2023-08-31 NOTE — Patient Instructions (Signed)

## 2023-09-13 ENCOUNTER — Encounter: Payer: Self-pay | Admitting: Internal Medicine

## 2023-09-17 ENCOUNTER — Other Ambulatory Visit: Payer: Self-pay | Admitting: Internal Medicine

## 2023-09-17 MED ORDER — TIRZEPATIDE 2.5 MG/0.5ML ~~LOC~~ SOAJ: 2.5000 mg | SUBCUTANEOUS | 3 refills | Status: DC

## 2023-09-19 ENCOUNTER — Telehealth: Payer: Self-pay

## 2023-09-19 ENCOUNTER — Other Ambulatory Visit (HOSPITAL_COMMUNITY): Payer: Self-pay

## 2023-09-19 NOTE — Telephone Encounter (Signed)
Pharmacy Patient Advocate Encounter   Received notification from  [facility] that prior authorization for [facility] 2.5MG /0.5ML auto-injectors is required/requested.   Insurance verification completed.   The patient is insured through [employer] .   Per test claim: PA required; PA submitted to above mentioned insurance via CoverMyMeds Key/confirmation #/EOC [id number] Status is pending

## 2023-09-21 ENCOUNTER — Other Ambulatory Visit (HOSPITAL_COMMUNITY): Payer: Self-pay

## 2023-09-21 NOTE — Telephone Encounter (Signed)
Pharmacy Patient Advocate Encounter  Received notification from [facility] [employer] that Prior Authorization for [employer] 2.5MG /0.5ML auto-injectors has been DENIED.  See denial reason below. No denial letter attached in CMM. Will attach denial letter to Media tab once received.   PA #/Case ID/Reference #: [id number]   *denied based on the fact that A1C is only 5.9 and needs to be at least 6.5

## 2023-09-22 ENCOUNTER — Other Ambulatory Visit: Payer: Self-pay | Admitting: Internal Medicine

## 2023-09-22 DIAGNOSIS — J069 Acute upper respiratory infection, unspecified: Secondary | ICD-10-CM

## 2023-10-24 ENCOUNTER — Ambulatory Visit: Payer: No Typology Code available for payment source | Admitting: Nurse Practitioner

## 2023-10-24 ENCOUNTER — Encounter: Payer: Self-pay | Admitting: Nurse Practitioner

## 2023-10-24 VITALS — BP 150/92 | HR 69

## 2023-10-24 DIAGNOSIS — Z789 Other specified health status: Secondary | ICD-10-CM

## 2023-10-24 DIAGNOSIS — I10 Essential (primary) hypertension: Secondary | ICD-10-CM

## 2023-10-24 NOTE — Progress Notes (Signed)
 [facility]ational [facility]- [facility]  Subjective:  Patient ID: Jennifer Quinn, female    DOB: 12-Sep-1955  Age: 68 y.o. MRN: [id number]  CC: Wellness exam   HPI Jennifer Quinn presents for wellness exam visit for insurance benefit.  Patient has a PCP: Dr. Posey Rea PMH significant for: HTN, pre-diabetes,  Last labs per PCP were completed: 08/24/2023  Health Maintenance:  Colonoscopy: April 2023, repeat in 5 years  Mammogram: Feb 2024, due for this     Smoker: former  Immunizations:  Shingrix- x1, declined second dose  Flu- completed.  COVID- x5   Lifestyle: Diet- increased due to no longer being on ozempic. Working with [employer]. Admits to eating food with increased sodium Exercise- no exercise.     HTN: Admits to not taking antihypertensives as prescribed. Took amlodipine this morning but had not taken this in 3-4 days. Has not taken hydrochlorothiazide in a while, uses it as needed.     Past Medical History:  Diagnosis Date   Asthma    Chest pain    Chronic constipation    Depression    Fibroid    GERD (gastroesophageal reflux disease)    occ   History of anemia    Hypertension    PMB (postmenopausal bleeding)    Vitamin D deficiency    Wears contact lenses    Wears partial dentures    upper and lower    Past Surgical History:  Procedure Laterality Date   COLONOSCOPY  2007, 01/25/2011. 05/25/16   DILATATION & CURETTAGE/HYSTEROSCOPY WITH MYOSURE N/A 07/23/2018   Procedure: DILATATION & CURETTAGE/HYSTEROSCOPY WITH MYOSURE;  Surgeon: Romualdo Bolk, MD;  Location: [facility] [place] [facility];  Service: Gynecology;  Laterality: N/A;  Follow 1st case. Use [facility] virtual OR in place of [facility] block time.    Outpatient Medications Prior to Visit  Medication Sig Dispense Refill   albuterol (VENTOLIN HFA) 108 (90 Base) MCG/ACT inhaler TAKE 2 PUFFS BY MOUTH EVERY 6 HOURS AS NEEDED FOR WHEEZE OR SHORTNESS OF BREATH 8.5 each 3   buPROPion (WELLBUTRIN) 100 MG tablet Take 1  tablet (100 mg total) by mouth daily. Annual appt due in Oct must see provider for future refills 90 tablet 0   Cholecalciferol (VITAMIN D PO) Take 1,000 Units by mouth daily.      Fish Oil OIL Take 2,000 mg by mouth daily.     ibuprofen (ADVIL) 800 MG tablet Take 800 mg by mouth.     metFORMIN (GLUCOPHAGE) 500 MG tablet Take 1 tablet (500 mg total) by mouth daily with breakfast. 90 tablet 3   acetaminophen (TYLENOL) 500 MG tablet Take 500 mg by mouth every 6 (six) hours as needed.     amLODipine (NORVASC) 5 MG tablet TAKE 1 TABLET (5 MG TOTAL) BY MOUTH DAILY. 90 tablet 3   potassium chloride SA (KLOR-CON M20) 20 MEQ tablet Take 1 tablet (20 mEq total) by mouth daily. (Patient not taking: Reported on 10/24/2023) 90 tablet 3   tirzepatide (MOUNJARO) 2.5 MG/0.5ML Pen Inject 2.5 mg into the skin once a week. (Patient not taking: Reported on 10/24/2023) 2 mL 3   clonazePAM (KLONOPIN) 0.5 MG tablet SMARTSIG:1 Tablet(s) By Mouth (Patient not taking: Reported on 10/24/2023)     meloxicam (MOBIC) 15 MG tablet Take 15 mg by mouth daily.     No facility-administered medications prior to visit.    ROS Review of Systems  Constitutional:  Negative for activity change.  Respiratory:  Negative for shortness of breath.  Cardiovascular:  Positive for leg swelling. Negative for chest pain.  Musculoskeletal:  Positive for joint swelling.  Neurological:  Negative for dizziness and headaches.    Objective:  BP (!) 150/92   Pulse 69   SpO2 95%   Physical Exam Constitutional:      General: She is not in acute distress. HENT:     Head: Normocephalic.     Right Ear: Tympanic membrane, ear canal and external ear normal.     Left Ear: Tympanic membrane, ear canal and external ear normal.     Mouth/Throat:     Mouth: Mucous membranes are moist.     Pharynx: No oropharyngeal exudate or posterior oropharyngeal erythema.  Eyes:     Pupils: Pupils are equal, round, and reactive to light.  Cardiovascular:      Rate and Rhythm: Normal rate and regular rhythm.     Heart sounds: Normal heart sounds.  Pulmonary:     Effort: Pulmonary effort is normal.     Breath sounds: Normal breath sounds.  Abdominal:     Palpations: Abdomen is soft.  Musculoskeletal:     Right lower leg: No edema.     Left lower leg: No edema.     Comments: Slight pitting  Skin:    General: Skin is warm.  Neurological:     General: No focal deficit present.     Mental Status: She is alert and oriented to person, place, and time.      Assessment & Plan:    Jennifer Quinn was seen today for wellness exam.  Diagnoses and all orders for this visit:  Participant in health and wellness plan Adult wellness physical was conducted today. Importance of diet and exercise were discussed in detail.  We reviewed immunizations and gave recommendations regarding current immunization needed for age.  Preventative health exams needed: Mammogram, ordered placed. Patient was advised yearly wellness exam  -     MM 3D SCREENING MAMMOGRAM BILATERAL BREAST; Future  Essential hypertension BP is not controlled today. Instructed to start taking amlodipine daily. Also take hydrochlorothiazide for a few days and monitor BP readings daily. Reduce sodium intake. Instructed to follow-up if BP remains elevated.    Orders Placed This Encounter  Procedures   MM 3D SCREENING MAMMOGRAM BILATERAL BREAST    No orders of the defined types were placed in this encounter.   Follow-up: as needed, for HTN.

## 2023-10-31 ENCOUNTER — Ambulatory Visit
Admission: RE | Admit: 2023-10-31 | Discharge: 2023-10-31 | Disposition: A | Discharge status: Home / Self Care | Source: Ambulatory Visit | Attending: Nurse Practitioner | Admitting: Nurse Practitioner

## 2023-10-31 DIAGNOSIS — Z789 Other specified health status: Secondary | ICD-10-CM

## 2023-11-07 ENCOUNTER — Emergency Department (HOSPITAL_BASED_OUTPATIENT_CLINIC_OR_DEPARTMENT_OTHER)
Admission: EM | Admit: 2023-11-07 | Discharge: 2023-11-07 | Disposition: A | Discharge status: Home / Self Care | Payer: PRIVATE HEALTH INSURANCE | Attending: Emergency Medicine | Admitting: Emergency Medicine

## 2023-11-07 ENCOUNTER — Other Ambulatory Visit: Payer: Self-pay

## 2023-11-07 ENCOUNTER — Encounter (HOSPITAL_BASED_OUTPATIENT_CLINIC_OR_DEPARTMENT_OTHER): Payer: Self-pay | Admitting: Emergency Medicine

## 2023-11-07 DIAGNOSIS — Z79899 Other long term (current) drug therapy: Secondary | ICD-10-CM | POA: Diagnosis not present

## 2023-11-07 DIAGNOSIS — Z87891 Personal history of nicotine dependence: Secondary | ICD-10-CM | POA: Diagnosis not present

## 2023-11-07 DIAGNOSIS — M5432 Sciatica, left side: Secondary | ICD-10-CM | POA: Insufficient documentation

## 2023-11-07 DIAGNOSIS — I10 Essential (primary) hypertension: Secondary | ICD-10-CM | POA: Diagnosis not present

## 2023-11-07 DIAGNOSIS — M549 Dorsalgia, unspecified: Secondary | ICD-10-CM | POA: Diagnosis present

## 2023-11-07 DIAGNOSIS — J45909 Unspecified asthma, uncomplicated: Secondary | ICD-10-CM | POA: Diagnosis not present

## 2023-11-07 MED ORDER — HYDROCODONE-ACETAMINOPHEN 5-325 MG PO TABS
1.0000 | ORAL_TABLET | Freq: Once | ORAL | Status: AC
  Administered 2023-11-07: 1 via ORAL
  Filled 2023-11-07: qty 1

## 2023-11-07 MED ORDER — PREDNISONE 50 MG PO TABS
60.0000 mg | ORAL_TABLET | Freq: Once | ORAL | Status: AC
  Administered 2023-11-07: 60 mg via ORAL
  Filled 2023-11-07: qty 1

## 2023-11-07 MED ORDER — PREDNISONE 10 MG PO TABS
40.0000 mg | ORAL_TABLET | Freq: Every day | ORAL | 0 refills | Status: DC
Start: 2023-11-07 — End: 2023-11-13

## 2023-11-07 NOTE — Discharge Instructions (Addendum)
 Symptoms consistent with sciatica.  Take the prednisone as directed.  Can supplement that with extra strength Tylenol.  Rest is much as possible.  Work note provided to be out of work for a week.  If symptoms do not improve over the next couple weeks then further evaluation with imaging may be required.  Make an appointment to follow-up with your regular doctor.

## 2023-11-07 NOTE — ED Triage Notes (Signed)
 Pt c/o sciatica pain to left side intermittently since Sat

## 2023-11-07 NOTE — ED Provider Notes (Signed)
 Jennifer Quinn EMERGENCY DEPARTMENT AT [facility] Provider Note   CSN: [id number] Arrival date & time: 11/07/23  7829     History  Chief Complaint  Patient presents with   Sciatica    Jennifer Quinn is a 68 y.o. female.  Patient with a complaint of left-sided back pain that radiates down the left leg.  No numbness or weakness to the left foot.  No problems with the right lower extremity.  No incontinence.  No fall or injury no similar problem in the past.  Past medical history of hypertension asthma chronic constipation.  Patient former smoker quit 1986.       Home Medications Prior to Admission medications   Medication Sig Start Date End Date Taking? Authorizing Provider  predniSONE (DELTASONE) 10 MG tablet Take 4 tablets (40 mg total) by mouth daily. 11/07/23  Yes Vanetta Mulders, MD  acetaminophen (TYLENOL) 500 MG tablet Take 500 mg by mouth every 6 (six) hours as needed.    [provider]  albuterol (VENTOLIN HFA) 108 (90 Base) MCG/ACT inhaler TAKE 2 PUFFS BY MOUTH EVERY 6 HOURS AS NEEDED FOR WHEEZE OR SHORTNESS OF BREATH 09/22/23   Plotnikov, Georgina Quint, MD  amLODipine (NORVASC) 5 MG tablet TAKE 1 TABLET (5 MG TOTAL) BY MOUTH DAILY. 08/08/23 10/07/23  Plotnikov, Georgina Quint, MD  buPROPion (WELLBUTRIN) 100 MG tablet Take 1 tablet (100 mg total) by mouth daily. Annual appt due in Oct must see provider for future refills 03/13/23   Plotnikov, Georgina Quint, MD  Cholecalciferol (VITAMIN D PO) Take 1,000 Units by mouth daily.     [provider]  Fish Oil OIL Take 2,000 mg by mouth daily.    [provider]  ibuprofen (ADVIL) 800 MG tablet Take 800 mg by mouth. 11/17/22   [provider]  metFORMIN (GLUCOPHAGE) 500 MG tablet Take 1 tablet (500 mg total) by mouth daily with breakfast. 08/29/23   Plotnikov, Georgina Quint, MD  potassium chloride SA (KLOR-CON M20) 20 MEQ tablet Take 1 tablet (20 mEq total) by mouth daily. Patient not taking: Reported on  10/24/2023 08/29/23   Plotnikov, Georgina Quint, MD  tirzepatide [facility]) 2.5 MG/0.5ML Pen Inject 2.5 mg into the skin once a week. Patient not taking: Reported on 10/24/2023 09/17/23   Plotnikov, Georgina Quint, MD      Allergies    Patient has no known allergies.    Review of Systems   Review of Systems  Constitutional:  Negative for chills and fever.  HENT:  Negative for ear pain and sore throat.   Eyes:  Negative for pain and visual disturbance.  Respiratory:  Negative for cough and shortness of breath.   Cardiovascular:  Negative for chest pain and palpitations.  Gastrointestinal:  Negative for abdominal pain and vomiting.  Genitourinary:  Negative for dysuria and hematuria.  Musculoskeletal:  Positive for back pain. Negative for arthralgias.  Skin:  Negative for color change and rash.  Neurological:  Negative for seizures, syncope, weakness and numbness.  All other systems reviewed and are negative.   Physical Exam Updated Vital Signs BP (!) 143/75   Pulse 68   Temp 98.7 F (37.1 C) (Oral)   Resp 18   Ht 1.575 m (5\' 2" )   Wt 106.1 kg   SpO2 99%   BMI 42.80 kg/m  Physical Exam Vitals and nursing note reviewed.  Constitutional:      General: She is not in acute distress.    Appearance: Normal appearance. She is  well-developed. She is not ill-appearing.  HENT:     Head: Normocephalic and atraumatic.  Eyes:     Extraocular Movements: Extraocular movements intact.     Conjunctiva/sclera: Conjunctivae normal.     Pupils: Pupils are equal, round, and reactive to light.  Cardiovascular:     Rate and Rhythm: Normal rate and regular rhythm.     Heart sounds: No murmur heard. Pulmonary:     Effort: Pulmonary effort is normal. No respiratory distress.     Breath sounds: Normal breath sounds.  Abdominal:     Palpations: Abdomen is soft.     Tenderness: There is no abdominal tenderness.  Musculoskeletal:        General: No swelling.     Cervical back: Neck supple.  Skin:     General: Skin is warm and dry.     Capillary Refill: Capillary refill takes less than 2 seconds.  Neurological:     General: No focal deficit present.     Mental Status: She is alert and oriented to person, place, and time.     Cranial Nerves: No cranial nerve deficit.     Sensory: No sensory deficit.     Motor: No weakness.     Coordination: Coordination normal.  Psychiatric:        Mood and Affect: Mood normal.     ED Results / Procedures / Treatments   Labs (all labs ordered are listed, but only abnormal results are displayed) Labs Reviewed - No data to display  EKG None  Radiology No results found.  Procedures Procedures    Medications Ordered in ED Medications  predniSONE (DELTASONE) tablet 60 mg (has no administration in time range)  HYDROcodone-acetaminophen (NORCO/VICODIN) 5-325 MG per tablet 1 tablet (has no administration in time range)    ED Course/ Medical Decision Making/ A&P                                 Medical Decision Making Risk Prescription drug management.   Symptoms consistent with left-sided sciatica.  No neurodeficit however.  Will treat with a course of prednisone have her follow-up with primary care doctor.  Can take extra strength Tylenol along with the prednisone.  Will give 1 dose of hydrocodone here to help her until the prednisone kicks in.  Patient will need work note.   Final Clinical Impression(s) / ED Diagnoses Final diagnoses:  Sciatica of left side    Rx / DC Orders ED Discharge Orders          Ordered    predniSONE (DELTASONE) 10 MG tablet  Daily        11/07/23 0737              Vanetta Mulders, MD 11/07/23 [phone]

## 2023-11-13 ENCOUNTER — Encounter: Payer: Self-pay | Admitting: Internal Medicine

## 2023-11-13 ENCOUNTER — Ambulatory Visit: Payer: PRIVATE HEALTH INSURANCE | Admitting: Internal Medicine

## 2023-11-13 VITALS — BP 136/82 | HR 67 | Temp 97.8°F | Ht 62.0 in

## 2023-11-13 DIAGNOSIS — G5702 Lesion of sciatic nerve, left lower limb: Secondary | ICD-10-CM

## 2023-11-13 DIAGNOSIS — E559 Vitamin D deficiency, unspecified: Secondary | ICD-10-CM | POA: Diagnosis not present

## 2023-11-13 DIAGNOSIS — R202 Paresthesia of skin: Secondary | ICD-10-CM | POA: Diagnosis not present

## 2023-11-13 MED ORDER — KETOROLAC TROMETHAMINE 60 MG/2ML IM SOLN
60.0000 mg | Freq: Once | INTRAMUSCULAR | Status: AC
  Administered 2023-11-13: 60 mg via INTRAMUSCULAR

## 2023-11-13 MED ORDER — CYCLOBENZAPRINE HCL 5 MG PO TABS: 5.0000 mg | ORAL_TABLET | Freq: Three times a day (TID) | ORAL | 1 refills | Status: DC | PRN

## 2023-11-13 MED ORDER — HYDROCODONE-ACETAMINOPHEN 10-325 MG PO TABS: 1.0000 | ORAL_TABLET | Freq: Three times a day (TID) | ORAL | 0 refills | Status: AC | PRN

## 2023-11-13 MED ORDER — PREDNISONE 10 MG PO TABS
ORAL_TABLET | ORAL | 1 refills | Status: DC
Start: 2023-11-13 — End: 2024-05-02

## 2023-11-13 NOTE — Progress Notes (Signed)
 Subjective:  Patient ID: Jennifer Quinn, female    DOB: [birth year]  Age: 68 y.o. MRN: [id number]  CC: Hospitalization Follow-up (Hospitalization follow up (11/07/23). Pain radiating from top of hip to bottom of left leg for the past 2 weeks. Burning/pain in left leg still present post ED visit. Has since finished prednisone . Has been treating with tylenol )   HPI Jennifer Quinn presents for L side LBP/LLE- severe >10/10 w/ROM x 1 week. Prednisone  40 mg/d x 4 d did not help...  Per ER: "Symptoms consistent with left-sided sciatica.  No neurodeficit however. Will treat with a course of prednisone  have her follow-up with primary care doctor.  Can take extra strength Tylenol  along with the prednisone .  Will give 1 dose of hydrocodone  here to help her until the prednisone  kicks in.  Patient will need work note."  Outpatient Medications Prior to Visit  Medication Sig Dispense Refill   acetaminophen  (TYLENOL ) 500 MG tablet Take 500 mg by mouth every 6 (six) hours as needed.     albuterol  (VENTOLIN  HFA) 108 (90 Base) MCG/ACT inhaler TAKE 2 PUFFS BY MOUTH EVERY 6 HOURS AS NEEDED FOR WHEEZE OR SHORTNESS OF BREATH 8.5 each 3   Cholecalciferol (VITAMIN D  PO) Take 1,000 Units by mouth daily.      Fish Oil OIL Take 2,000 mg by mouth daily.     metFORMIN  (GLUCOPHAGE ) 500 MG tablet Take 1 tablet (500 mg total) by mouth daily with breakfast. 90 tablet 3   potassium chloride  SA (KLOR-CON  M20) 20 MEQ tablet Take 1 tablet (20 mEq total) by mouth daily. 90 tablet 3   buPROPion  (WELLBUTRIN ) 100 MG tablet Take 1 tablet (100 mg total) by mouth daily. Annual appt due in Oct must see provider for future refills 90 tablet 0   amLODipine  (NORVASC ) 5 MG tablet TAKE 1 TABLET (5 MG TOTAL) BY MOUTH DAILY. 90 tablet 3   ibuprofen  (ADVIL ) 800 MG tablet Take 800 mg by mouth.     predniSONE  (DELTASONE ) 10 MG tablet Take 4 tablets (40 mg total) by mouth daily. 20 tablet 0   tirzepatide (MOUNJARO) 2.5 MG/0.5ML Pen Inject 2.5 mg  into the skin once a week. (Patient not taking: Reported on 10/24/2023) 2 mL 3   No facility-administered medications prior to visit.    ROS: Review of Systems  Constitutional:  Negative for activity change, appetite change, chills, fatigue and unexpected weight change.  HENT:  Negative for congestion, mouth sores and sinus pressure.   Eyes:  Negative for visual disturbance.  Respiratory:  Negative for cough and chest tightness.   Gastrointestinal:  Negative for abdominal pain and nausea.  Genitourinary:  Negative for difficulty urinating, frequency and vaginal pain.  Musculoskeletal:  Positive for back pain and gait problem.  Skin:  Negative for pallor and rash.  Neurological:  Negative for dizziness, tremors, weakness, numbness and headaches.  Psychiatric/Behavioral:  Negative for confusion and sleep disturbance.     Objective:  BP 136/82   Pulse 67   Temp 97.8 F (36.6 C)   Ht 5\' 2"  (1.575 m)   SpO2 98%   BMI 42.80 kg/m   BP Readings from Last 3 Encounters:  11/13/23 136/82  11/07/23 (!) 121/56  10/24/23 (!) 150/92    Wt Readings from Last 3 Encounters:  11/07/23 234 lb (106.1 kg)  08/30/23 234 lb (106.1 kg)  08/24/23 234 lb (106.1 kg)    Physical Exam Constitutional:      General: She is not in acute  distress.    Appearance: She is well-developed. She is obese.  HENT:     Head: Normocephalic.     Right Ear: External ear normal.     Left Ear: External ear normal.     Nose: Nose normal.  Eyes:     General:        Right eye: No discharge.        Left eye: No discharge.     Conjunctiva/sclera: Conjunctivae normal.     Pupils: Pupils are equal, round, and reactive to light.  Neck:     Thyroid : No thyromegaly.     Vascular: No JVD.     Trachea: No tracheal deviation.  Cardiovascular:     Rate and Rhythm: Normal rate and regular rhythm.     Heart sounds: Normal heart sounds.  Pulmonary:     Effort: No respiratory distress.     Breath sounds: No stridor. No  wheezing.  Abdominal:     General: Bowel sounds are normal. There is no distension.     Palpations: Abdomen is soft. There is no mass.     Tenderness: There is no abdominal tenderness. There is no guarding or rebound.  Musculoskeletal:        General: Tenderness present.     Cervical back: Normal range of motion and neck supple. No rigidity.     Right lower leg: No edema.     Left lower leg: No edema.  Lymphadenopathy:     Cervical: No cervical adenopathy.  Skin:    Findings: No erythema or rash.  Neurological:     Mental Status: She is oriented to person, place, and time.     Cranial Nerves: No cranial nerve deficit.     Motor: No abnormal muscle tone.     Coordination: Coordination normal.     Deep Tendon Reflexes: Reflexes normal.  Psychiatric:        Behavior: Behavior normal.        Thought Content: Thought content normal.        Judgment: Judgment normal.   In a w/c  Lab Results  Component Value Date   WBC 6.4 06/21/2022   HGB 13.1 06/21/2022   HCT 40.0 06/21/2022   PLT 244.0 06/21/2022   GLUCOSE 96 08/24/2023   CHOL 164 12/19/2022   TRIG 138.0 12/19/2022   HDL 37.30 (L) 12/19/2022   LDLDIRECT 157.0 05/09/2018   LDLCALC 99 12/19/2022   ALT 11 08/24/2023   AST 14 08/24/2023   NA 141 08/24/2023   K 4.2 08/24/2023   CL 103 08/24/2023   CREATININE 1.00 08/24/2023   BUN 15 08/24/2023   CO2 30 08/24/2023   TSH 2.12 06/21/2022   HGBA1C 5.9 08/24/2023    No results found.  Assessment & Plan:   Problem List Items Addressed This Visit     Vitamin D  deficiency   On Vit D      Paresthesias   Due to sciatica Treatment-see above      Sciatic nerve disease, left - Primary   Piriformis syndrome vs sciatica Prednisone  10 mg: take 4 tabs a day x 3 days; then 3 tabs a day x 4 days; then 2 tabs a day x 4 days, then 1 tab a day x 6 days, then stop. Take pc. Norco 10/325 po QID prn Flexeril  given Toradol  IM given due to severe pain MRI LS spine if not better       Relevant Medications   cyclobenzaprine  (FLEXERIL ) 5 MG  tablet      Meds ordered this encounter  Medications   predniSONE  (DELTASONE ) 10 MG tablet    Sig: Prednisone  10 mg: take 4 tabs a day x 3 days; then 3 tabs a day x 4 days; then 2 tabs a day x 4 days, then 1 tab a day x 6 days, then stop. Take pc.    Dispense:  38 tablet    Refill:  1   HYDROcodone -acetaminophen  (NORCO) 10-325 MG tablet    Sig: Take 1 tablet by mouth every 8 (eight) hours as needed for up to 5 days.    Dispense:  20 tablet    Refill:  0   cyclobenzaprine  (FLEXERIL ) 5 MG tablet    Sig: Take 1 tablet (5 mg total) by mouth 3 (three) times daily as needed for muscle spasms.    Dispense:  30 tablet    Refill:  1   ketorolac  (TORADOL ) injection 60 mg      Follow-up: Return in about 2 weeks (around 11/27/2023) for a follow-up visit.  Anitra Barn, MD

## 2023-11-13 NOTE — Assessment & Plan Note (Addendum)
 Piriformis syndrome vs sciatica Prednisone 10 mg: take 4 tabs a day x 3 days; then 3 tabs a day x 4 days; then 2 tabs a day x 4 days, then 1 tab a day x 6 days, then stop. Take pc. Norco 10/325 po QID prn Flexeril given Toradol IM given due to severe pain MRI LS spine if not better

## 2023-11-17 ENCOUNTER — Encounter: Payer: Self-pay | Admitting: Internal Medicine

## 2023-11-24 ENCOUNTER — Other Ambulatory Visit: Payer: Self-pay | Admitting: Internal Medicine

## 2023-11-24 DIAGNOSIS — F3289 Other specified depressive episodes: Secondary | ICD-10-CM

## 2023-11-27 ENCOUNTER — Other Ambulatory Visit: Payer: Self-pay | Admitting: Internal Medicine

## 2023-11-27 ENCOUNTER — Encounter: Payer: Self-pay | Admitting: Internal Medicine

## 2023-11-27 DIAGNOSIS — G5702 Lesion of sciatic nerve, left lower limb: Secondary | ICD-10-CM

## 2023-11-29 ENCOUNTER — Ambulatory Visit: Payer: PRIVATE HEALTH INSURANCE | Attending: Internal Medicine | Admitting: Physical Therapy

## 2023-11-29 DIAGNOSIS — M6281 Muscle weakness (generalized): Secondary | ICD-10-CM | POA: Insufficient documentation

## 2023-11-29 DIAGNOSIS — M5432 Sciatica, left side: Secondary | ICD-10-CM | POA: Diagnosis present

## 2023-11-29 DIAGNOSIS — R2689 Other abnormalities of gait and mobility: Secondary | ICD-10-CM | POA: Diagnosis present

## 2023-11-29 DIAGNOSIS — G5702 Lesion of sciatic nerve, left lower limb: Secondary | ICD-10-CM | POA: Insufficient documentation

## 2023-11-29 DIAGNOSIS — M79605 Pain in left leg: Secondary | ICD-10-CM | POA: Insufficient documentation

## 2023-11-29 NOTE — Therapy (Signed)
 OUTPATIENT PHYSICAL THERAPY THORACOLUMBAR EVALUATION   Patient Name: Jennifer Quinn MRN: [id number] DOB:01-18-1956, 68 y.o., female Today's Date: 11/29/2023  END OF SESSION:  PT End of Session - 11/29/23 1531     Visit Number 1    Number of Visits 5   with eval   Date for PT Re-Evaluation 01/10/24    Authorization Type Generic Commercial    PT Start Time 1530    PT Stop Time 1600    PT Time Calculation (min) 30 min    Activity Tolerance Patient tolerated treatment well    Behavior During Therapy WFL for tasks assessed/performed             Past Medical History:  Diagnosis Date   Asthma    Chest pain    Chronic constipation    Depression    Fibroid    GERD (gastroesophageal reflux disease)    occ   History of anemia    Hypertension    PMB (postmenopausal bleeding)    Vitamin D deficiency    Wears contact lenses    Wears partial dentures    upper and lower   Past Surgical History:  Procedure Laterality Date   COLONOSCOPY  2007, 01/25/2011. 05/25/16   DILATATION & CURETTAGE/HYSTEROSCOPY WITH MYOSURE N/A 07/23/2018   Procedure: DILATATION & CURETTAGE/HYSTEROSCOPY WITH MYOSURE;  Surgeon: Romualdo Bolk, MD;  Location: [facility];  Service: Gynecology;  Laterality: N/A;  Follow 1st case. Use [facility] virtual OR in place of [facility] block time.   Patient Active Problem List   Diagnosis Date Noted   Sciatic nerve disease, left 11/13/2023   CRI (chronic renal insufficiency), stage 3 (moderate) (HCC) 04/26/2023   Primary osteoarthritis of left knee 01/30/2023   Coronary atherosclerosis 10/14/2021   Aortic atherosclerosis (HCC) 10/14/2021   Leg weakness, bilateral 06/16/2021   Acute sinusitis 06/03/2019   Diabetes (HCC) 06/03/2019   Knee pain, left 05/09/2018   Paresthesias 09/13/2016   Nipple lesion 09/23/2015   GERD (gastroesophageal reflux disease) 05/22/2015   Chest pain, atypical 05/22/2015   Well adult exam 12/12/2011   Anxiety 12/12/2011    Constipation 12/12/2011   ELEVATED BLOOD PRESSURE 09/03/2010   History of colonic polyps 09/03/2010   ALLERGIC RHINITIS 12/15/2009   VERTIGO 04/25/2008   Vitamin D deficiency 08/28/2007   OBESITY, MORBID 08/28/2007   Edema 08/28/2007   Weight gain 08/28/2007   DEPRESSION 06/09/2007    PCP: Plotnikov, Georgina Quint, MD  REFERRING PROVIDER: Tresa Garter, MD  REFERRING DIAG: G57.02 (ICD-10-CM) - Sciatic nerve disease, left  Rationale for Evaluation and Treatment: Rehabilitation  THERAPY DIAG:  Muscle weakness (generalized)  Other abnormalities of gait and mobility  Sciatica, left side  Pain in left leg  ONSET DATE: 11/27/2023 (referral date)  SUBJECTIVE:  SUBJECTIVE STATEMENT: Pt reports that she started having sciatic pain down her L side about 4 weeks ago, she just woke up one day and couldn't walk. Pt reports that she has a dull, aching pain in her L buttocks and a burning pain in her left lower leg down into her big toe. Pt just finished with prednisone and muscle relaxers, wanting more muscle relaxers. 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 Her pain actually feels the worst when she lays down flat in her bed. Pt also has a very tall bed, was having difficulty getting in/out of it. Pt also limited in her ability to do things around the house because of her pain. Pt has also tried alcohol rub and capsaicin cream on her L leg.  PERTINENT HISTORY:  PMH: L knee OA, asthma, depression, GERD, anemia, HTN  PAIN:  Are you having pain? Yes: NPRS scale: 3/10 Pain location: dullness in buttocks, can get burning down into LLE Pain description: dull in buttocks, burning in LLE Aggravating factors: standing for too long, laying down  flat and stretch out leg hurts the most - makes it tingle Relieving factors: cool towel, Tylenol, alternates with ibruprofen  PRECAUTIONS: None  RED FLAGS: None   WEIGHT BEARING RESTRICTIONS: No  FALLS:  Has patient fallen in last 6 months? 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 Posey Rea  OBJECTIVE:  Note: Objective measures were completed at Evaluation unless otherwise noted.  DIAGNOSTIC FINDINGS:  None relevant to this POC, can get LS imaging if warranted  PATIENT SURVEYS:  None assessed at eval  COGNITION: Overall cognitive status: Within functional limits for tasks assessed     SENSATION: N/T and burning down in distal LLE into big toe Light touch WFL   POSTURE: rounded shoulders, forward head, and posterior pelvic tilt  PALPATION: Mild TTP along L piriformis  LUMBAR ROM:   AROM eval  Flexion 50%, L HS tightness  Extension 25%, painful on L side  Right lateral flexion WFL  Left lateral flexion 75%, painful on L side  Right rotation WFL, no pain  Left rotation WFL, pain in L side at end range   (Blank rows = not tested)  LOWER EXTREMITY ROM:     Active  Right eval Left eval  Hip flexion [facility]  Hip extension    Hip abduction    Hip adduction    Hip internal rotation    Hip external rotation    Knee flexion    Knee extension    Ankle dorsiflexion    Ankle plantarflexion    Ankle inversion    Ankle eversion     (Blank rows = not tested)  LOWER EXTREMITY MMT:    MMT Right eval Left eval  Hip flexion 5 5  Hip extension    Hip abduction 4 3  Hip adduction    Hip internal rotation    Hip external  rotation    Knee flexion 5 5  Knee extension 5 5  Ankle dorsiflexion 5 2-  Ankle plantarflexion    Ankle inversion    Ankle  eversion     (Blank rows = not tested)  LUMBAR SPECIAL TESTS:  Slump test: Positive  GAIT: Distance walked: various clinic distances Assistive device utilized: None Level of assistance: Modified independence Comments: increased time, antalgic  TREATMENT:   TherEx Seated piriformis stretch L side 3 x 30 sec Supine sciatic nerve glide L side x 5 reps  Added to HEP, see bolded below                                                                                                                                 PATIENT EDUCATION:  Education details: Eval findings, PT POC, sciatic, initial HEP Person educated: Patient Education method: Explanation, Demonstration, and Handouts Education comprehension: verbalized understanding, returned demonstration, and needs further education  HOME EXERCISE PROGRAM: Access Code: [email or web address] URL: [email or web address] Date: 11/29/2023 Prepared by: Peter Congo  Exercises - Seated Figure 4 Piriformis Stretch  - 1 x daily - 7 x weekly - 1 sets - 3-5 reps - 30 sec hold - Supine Sciatic Nerve Glide  - 1 x daily - 7 x weekly - 2 sets - 5-10 reps  ASSESSMENT:  CLINICAL IMPRESSION: Patient is a 68 year old female referred to Neuro OPPT for L-sided sciatica.   Pt's PMH is significant for: L knee OA, asthma, depression, GERD, anemia, and HTN. The following deficits were present during the exam: decreased L hip abd strength, (+) Slump test in LLE, increased pain, and impaired mobility. Pt would benefit from skilled PT to address these impairments and functional limitations to maximize functional mobility independence.   OBJECTIVE IMPAIRMENTS: Abnormal gait, decreased activity tolerance, decreased knowledge of condition, decreased mobility, difficulty walking, decreased ROM, decreased strength, impaired perceived  functional ability, impaired sensation, improper body mechanics, postural dysfunction, and pain.   ACTIVITY LIMITATIONS: carrying, lifting, bending, sitting, standing, squatting, stairs, transfers, and bed mobility  PARTICIPATION LIMITATIONS: meal prep, cleaning, laundry, community activity, and occupation  PERSONAL FACTORS: Fitness and 1-2 comorbidities:   L knee OA, asthma, depression, GERD, anemia, HTNare also affecting patient's functional outcome.   REHAB POTENTIAL: Excellent  CLINICAL DECISION MAKING: Stable/uncomplicated  EVALUATION COMPLEXITY: Low   GOALS: Goals reviewed with patient? Yes  SHORT TERM GOALS=LONG TERM GOALS due to length of POC   LONG TERM GOALS: Target date: 12/27/2023  Pt will be independent with final HEP for improved strength, ROM, and management of pain symptoms. Baseline:  Goal status: INITIAL  2.  Pt will demonstrate a (-) Slump test on L side for decreased sciatic nerve symptoms. Baseline: (+) on L side (4/9) Goal status: INITIAL  3.  Pt will improve her lumbar AROM by 25% in limited areas with no increase in pain symptoms Baseline: see eval Goal status: INITIAL  4.  Pt will improve her L hip abd strength to 4/5 for improved function Baseline: 3/5 (4/9) Goal status: INITIAL   PLAN:  PT FREQUENCY: 1x/week  PT DURATION:  4 sessions  PLANNED INTERVENTIONS: [id number]- PT Re-evaluation, 97110-Therapeutic exercises, 97530- Therapeutic activity, [id number]- Neuromuscular re-education, 97535- Self Care, [id number]- Manual therapy, [phone]- Gait training, [phone]- Aquatic Therapy, [phone]- Electrical stimulation (manual), Patient/Family education, Balance training, Taping, Dry Needling, Joint mobilization, Spinal mobilization, DME instructions, Cryotherapy, and Moist heat.  PLAN FOR NEXT SESSION: how is initial HEP? Add to HEP to address hip abd weakness, SKTC? Heel slides? Clams, diagonal hip abd, lateral sidestepping; lumbar/core ROM and strengthening, L HS  stretch   Peter Congo, PT Peter Congo, PT, DPT, CSRS  11/29/2023, 4:08 PM

## 2023-12-04 ENCOUNTER — Ambulatory Visit: Payer: PRIVATE HEALTH INSURANCE | Admitting: Physical Therapy

## 2023-12-04 DIAGNOSIS — R2689 Other abnormalities of gait and mobility: Secondary | ICD-10-CM

## 2023-12-04 DIAGNOSIS — M6281 Muscle weakness (generalized): Secondary | ICD-10-CM

## 2023-12-04 DIAGNOSIS — M5432 Sciatica, left side: Secondary | ICD-10-CM

## 2023-12-04 DIAGNOSIS — M79605 Pain in left leg: Secondary | ICD-10-CM

## 2023-12-04 NOTE — Telephone Encounter (Signed)
 Patient had since been followed up with and having their first appointment for physical therapy today.

## 2023-12-04 NOTE — Therapy (Signed)
 OUTPATIENT PHYSICAL THERAPY THORACOLUMBAR TREATMENT   Patient Name: Jennifer Quinn MRN: [id number] DOB:1956-05-08, 68 y.o., female Today's Date: 12/04/2023  END OF SESSION:  PT End of Session - 12/04/23 0802     Visit Number 2    Number of Visits 5   with eval   Date for PT Re-Evaluation 01/10/24    Authorization Type Generic Commercial    PT Start Time 0800    PT Stop Time 0838    PT Time Calculation (min) 38 min    Activity Tolerance Patient tolerated treatment well    Behavior During Therapy WFL for tasks assessed/performed              Past Medical History:  Diagnosis Date   Asthma    Chest pain    Chronic constipation    Depression    Fibroid    GERD (gastroesophageal reflux disease)    occ   History of anemia    Hypertension    PMB (postmenopausal bleeding)    Vitamin D deficiency    Wears contact lenses    Wears partial dentures    upper and lower   Past Surgical History:  Procedure Laterality Date   COLONOSCOPY  2007, 01/25/2011. 05/25/16   DILATATION & CURETTAGE/HYSTEROSCOPY WITH MYOSURE N/A 07/23/2018   Procedure: DILATATION & CURETTAGE/HYSTEROSCOPY WITH MYOSURE;  Surgeon: Wanita Gutta, MD;  Location: [facility];  Service: Gynecology;  Laterality: N/A;  Follow 1st case. Use [facility] virtual OR in place of [facility] block time.   Patient Active Problem List   Diagnosis Date Noted   Sciatic nerve disease, left 11/13/2023   CRI (chronic renal insufficiency), stage 3 (moderate) (HCC) 04/26/2023   Primary osteoarthritis of left knee 01/30/2023   Coronary atherosclerosis 10/14/2021   Aortic atherosclerosis (HCC) 10/14/2021   Leg weakness, bilateral 06/16/2021   Acute sinusitis 06/03/2019   Diabetes (HCC) 06/03/2019   Knee pain, left 05/09/2018   Paresthesias 09/13/2016   Nipple lesion 09/23/2015   GERD (gastroesophageal reflux disease) 05/22/2015   Chest pain, atypical 05/22/2015   Well adult exam 12/12/2011   Anxiety 12/12/2011    Constipation 12/12/2011   ELEVATED BLOOD PRESSURE 09/03/2010   History of colonic polyps 09/03/2010   ALLERGIC RHINITIS 12/15/2009   VERTIGO 04/25/2008   Vitamin D deficiency 08/28/2007   OBESITY, MORBID 08/28/2007   Edema 08/28/2007   Weight gain 08/28/2007   DEPRESSION 06/09/2007    PCP: Plotnikov, Oakley Bellman, MD  REFERRING PROVIDER: Genia Kettering, MD  REFERRING DIAG: G57.02 (ICD-10-CM) - Sciatic nerve disease, left  Rationale for Evaluation and Treatment: Rehabilitation  THERAPY DIAG:  Muscle weakness (generalized)  Other abnormalities of gait and mobility  Sciatica, left side  Pain in left leg  ONSET DATE: 11/27/2023 (referral date)  SUBJECTIVE:  SUBJECTIVE STATEMENT: Pt reports things have been "terrible" since initial eval, she is having to max out on Tylenol but is still getting stabbing pain in her L buttocks area, feels muscle weakness. Pt reports that the burning pain in her LLE is better. Pt does have intermittent bouts of her entire LLE going numb.  Pt has been working on her HEP, piriformis stretch can be tricky but she does it.  PERTINENT HISTORY:  PMH: L knee OA, asthma, depression, GERD, anemia, HTN  PAIN:  Are you having pain? Yes: NPRS scale: 5-6/10 Pain location: dullness in buttocks, can get burning down into LLE Pain description: dull in buttocks, burning in LLE Aggravating factors: standing for too long, laying down flat and stretch out leg hurts the most - makes it tingle Relieving factors: cool towel, Tylenol, alternates with ibruprofen  PRECAUTIONS: None  RED FLAGS: None   WEIGHT BEARING RESTRICTIONS: No  FALLS:  Has patient fallen in last 6 months? 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 Georgia Kipper  OBJECTIVE:  Note: Objective measures were completed at Evaluation unless otherwise noted.  DIAGNOSTIC FINDINGS:  None relevant to this POC, can get LS imaging if warranted  PATIENT SURVEYS:  None assessed at eval  COGNITION: Overall cognitive status: Within functional limits for tasks assessed     SENSATION: N/T and burning down in distal LLE into big toe Light touch WFL   POSTURE: rounded shoulders, forward head, and posterior pelvic tilt  PALPATION: Mild TTP along L piriformis  LUMBAR ROM:   AROM eval  Flexion 50%, L HS tightness  Extension 25%, painful on L side  Right lateral flexion WFL  Left lateral flexion 75%, painful on L side  Right rotation WFL, no pain  Left rotation WFL, pain in L side at end range   (Blank rows = not tested)  LOWER EXTREMITY ROM:     Active  Right eval Left eval  Hip flexion [facility]  Hip extension    Hip abduction    Hip adduction    Hip internal rotation    Hip external rotation    Knee flexion    Knee extension    Ankle dorsiflexion    Ankle plantarflexion    Ankle inversion    Ankle eversion     (Blank rows = not tested)  LOWER EXTREMITY MMT:    MMT Right eval Left eval  Hip flexion 5 5  Hip extension    Hip abduction 4 3  Hip adduction    Hip internal rotation    Hip external rotation    Knee flexion 5 5  Knee extension 5 5  Ankle dorsiflexion 5 2-  Ankle plantarflexion    Ankle inversion    Ankle eversion     (Blank rows = not tested)  LUMBAR SPECIAL TESTS:  Slump test: Positive  GAIT: Distance walked: various clinic distances Assistive device utilized: None Level of assistance: Modified  independence Comments: increased time, antalgic  TREATMENT:   TherEx To work on L abductor strengthening in R sidelying: Clamshells 2 x 10 reps       Needs manual cues for first set, able to  perform second set independently  Hip abd 3 x 5 reps  Added to HEP, see bolded below  In supine to address muscle weakness and tightness: Bridges x 10 reps with 5 sec hold              SKTC 3 x 30 sec each B LTR x 10 reps B  In sitting: Seated B HS stretch x 30 sec each Seated forward fold 3 x 30 sec each                                                           PATIENT EDUCATION:  Education details: continue HEP, added to HEP Person educated: Patient Education method: Programmer, multimedia, Facilities manager, and Handouts Education comprehension: verbalized understanding, returned demonstration, and needs further education  HOME EXERCISE PROGRAM: Access Code: [email or web address] URL: [email or web address] Date: 11/29/2023 Prepared by: Lorita Rosa  Exercises - Seated Figure 4 Piriformis Stretch  - 1 x daily - 7 x weekly - 1 sets - 3-5 reps - 30 sec hold - Supine Sciatic Nerve Glide  - 1 x daily - 7 x weekly - 2 sets - 5-10 reps - Clamshell  - 1 x daily - 7 x weekly - 3 sets - 10 reps - Sidelying Hip Abduction  - 1 x daily - 7 x weekly - 3 sets - 5 reps  ASSESSMENT:  CLINICAL IMPRESSION: Emphasis of skilled PT session on reviewing various hip and low back stretches and strengthening exercises to add to HEP. Pt does exhibit L hip abd weakness and benefits from addition of strengthening exercises to her HEP. Pt continues to benefit from skilled PT services to work towards increased independence with management of her pain symptoms. Continue POC.    OBJECTIVE IMPAIRMENTS: Abnormal gait, decreased activity tolerance, decreased knowledge of condition, decreased mobility, difficulty walking, decreased ROM, decreased strength, impaired perceived functional ability, impaired sensation, improper  body mechanics, postural dysfunction, and pain.   ACTIVITY LIMITATIONS: carrying, lifting, bending, sitting, standing, squatting, stairs, transfers, and bed mobility  PARTICIPATION LIMITATIONS: meal prep, cleaning, laundry, community activity, and occupation  PERSONAL FACTORS: Fitness and 1-2 comorbidities:   L knee OA, asthma, depression, GERD, anemia, HTNare also affecting patient's functional outcome.   REHAB POTENTIAL: Excellent  CLINICAL DECISION MAKING: Stable/uncomplicated  EVALUATION COMPLEXITY: Low   GOALS: Goals reviewed with patient? Yes  SHORT TERM GOALS=LONG TERM GOALS due to length of POC   LONG TERM GOALS: Target date: 12/27/2023  Pt will be independent with final HEP for improved strength, ROM, and management of pain symptoms. Baseline:  Goal status: INITIAL  2.  Pt will demonstrate a (-) Slump test on L side for decreased sciatic nerve symptoms. Baseline: (+) on L side (4/9) Goal status: INITIAL  3.  Pt will improve her lumbar AROM by 25% in limited areas with no increase in pain symptoms Baseline: see eval Goal status: INITIAL  4.  Pt will improve her L hip abd strength to 4/5 for improved function Baseline: 3/5 (4/9) Goal status: INITIAL   PLAN:  PT FREQUENCY: 1x/week  PT DURATION:  4 sessions  PLANNED INTERVENTIONS: 97164- PT Re-evaluation, 97110-Therapeutic exercises, 97530- Therapeutic activity, 97112- Neuromuscular re-education, 97535- Self Care, 56213- Manual therapy, [id number]- Gait training, [phone]- Aquatic Therapy, [phone]- Electrical stimulation (manual), Patient/Family education, Balance training, Taping, Dry Needling, Joint  mobilization, Spinal mobilization, DME instructions, Cryotherapy, and Moist heat.  PLAN FOR NEXT SESSION: how is initial HEP? Add to HEP to address hip abd weakness, Heel slides? lateral sidestepping; lumbar/core ROM and strengthening   Lorita Rosa, PT Lorita Rosa, PT, DPT, CSRS  12/04/2023, 8:39 AM

## 2023-12-07 ENCOUNTER — Other Ambulatory Visit: Payer: Self-pay | Admitting: Internal Medicine

## 2023-12-07 DIAGNOSIS — F3289 Other specified depressive episodes: Secondary | ICD-10-CM

## 2023-12-10 NOTE — Assessment & Plan Note (Signed)
 Due to sciatica Treatment-see above

## 2023-12-10 NOTE — Assessment & Plan Note (Signed)
 On Vit D

## 2023-12-11 ENCOUNTER — Ambulatory Visit: Admitting: Physical Therapy

## 2023-12-11 DIAGNOSIS — M5432 Sciatica, left side: Secondary | ICD-10-CM

## 2023-12-11 DIAGNOSIS — M6281 Muscle weakness (generalized): Secondary | ICD-10-CM

## 2023-12-11 DIAGNOSIS — M79605 Pain in left leg: Secondary | ICD-10-CM

## 2023-12-11 DIAGNOSIS — R2689 Other abnormalities of gait and mobility: Secondary | ICD-10-CM

## 2023-12-11 NOTE — Therapy (Signed)
 OUTPATIENT PHYSICAL THERAPY THORACOLUMBAR TREATMENT   Patient Name: JAMEEKA MARCY MRN: [id number] DOB:06-Aug-1956, 68 y.o., female Today's Date: 12/11/2023  END OF SESSION:  PT End of Session - 12/11/23 0802     Visit Number 3    Number of Visits 5   with eval   Date for PT Re-Evaluation 01/10/24    Authorization Type [employer]   $50 copay   PT Start Time 0800    PT Stop Time 0838    PT Time Calculation (min) 38 min    Activity Tolerance Patient tolerated treatment well    Behavior During Therapy [facility] for tasks assessed/performed               Past Medical History:  Diagnosis Date   Asthma    Chest pain    Chronic constipation    Depression    Fibroid    GERD (gastroesophageal reflux disease)    occ   History of anemia    Hypertension    PMB (postmenopausal bleeding)    Vitamin D  deficiency    Wears contact lenses    Wears partial dentures    upper and lower   Past Surgical History:  Procedure Laterality Date   COLONOSCOPY  2007, 01/25/2011. 05/25/16   DILATATION & CURETTAGE/HYSTEROSCOPY WITH MYOSURE N/A 07/23/2018   Procedure: DILATATION & CURETTAGE/HYSTEROSCOPY WITH MYOSURE;  Surgeon: Wanita Gutta, MD;  Location: [facility];  Service: Gynecology;  Laterality: N/A;  Follow 1st case. Use [facility] virtual [facility] in [facility] of [facility] block time.   Patient Active Problem List   Diagnosis Date Noted   Sciatic nerve disease, left 11/13/2023   CRI (chronic renal insufficiency), stage 3 (moderate) (HCC) 04/26/2023   Primary osteoarthritis of left knee 01/30/2023   Coronary atherosclerosis 10/14/2021   Aortic atherosclerosis (HCC) 10/14/2021   Leg weakness, bilateral 06/16/2021   Acute sinusitis 06/03/2019   Diabetes (HCC) 06/03/2019   Knee pain, left 05/09/2018   Paresthesias 09/13/2016   Nipple lesion 09/23/2015   GERD (gastroesophageal reflux disease) 05/22/2015   Chest pain, atypical 05/22/2015   Well adult exam 12/12/2011   Anxiety 12/12/2011    Constipation 12/12/2011   ELEVATED BLOOD PRESSURE 09/03/2010   History of colonic polyps 09/03/2010   ALLERGIC RHINITIS 12/15/2009   VERTIGO 04/25/2008   Vitamin D  deficiency 08/28/2007   OBESITY, MORBID 08/28/2007   Edema 08/28/2007   Weight gain 08/28/2007   DEPRESSION 06/09/2007    PCP: Plotnikov, Oakley Bellman, MD  REFERRING PROVIDER: Genia Kettering, MD  REFERRING DIAG: G57.02 (ICD-10-CM) - Sciatic nerve disease, left  Rationale for Evaluation and Treatment: Rehabilitation  THERAPY DIAG:  Muscle weakness (generalized)  Sciatica, left side  Other abnormalities of gait and mobility  Pain in left leg  ONSET DATE: 11/27/2023 (referral date)  SUBJECTIVE:  SUBJECTIVE STATEMENT:  Pt reports her pain has been off and on but overall does feel like it's doing better. Pt continues to have numbness at nighttime in her LLE and tingling at the bottom of her leg. Pt has decreased from 3 tylenol  to 2 tylenol  and one ibuprofen  to make it through the day, doesn't have to take anything at the end of the day anymore.  Pt is able to walk better at work now too and doesn't have to rest after rushing to a resident's room due to LLE feeling weak.  Pt reports she has been working on her HEP over the week and does feel like the exercises are helpful. Prior to onset of symptoms pt enjoyed exercising, asking about her crunch machine and exercise bike.   PERTINENT HISTORY:  PMH: L knee OA, asthma, depression, GERD, anemia, HTN  PAIN:  Are you having pain? Yes: NPRS scale: 1/10 Pain location: dullness in buttocks, can get burning down into LLE Pain description: dull in buttocks, burning in LLE Aggravating factors: standing for too long, laying down flat and stretch out leg hurts the most - makes it  tingle Relieving factors: cool towel, Tylenol , alternates with ibruprofen  PRECAUTIONS: None  RED FLAGS: None   WEIGHT BEARING RESTRICTIONS: No  FALLS:  Has patient fallen in last 6 months? 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 Georgia Kipper  OBJECTIVE:  Note: Objective measures were completed at Evaluation unless otherwise noted.  DIAGNOSTIC FINDINGS:  None relevant to this POC, can get LS imaging if warranted  PATIENT SURVEYS:  None assessed at eval  COGNITION: Overall cognitive status: Within functional limits for tasks assessed     SENSATION: N/T and burning down in distal LLE into big toe Light touch WFL   POSTURE: rounded shoulders, forward head, and posterior pelvic tilt  PALPATION: Mild TTP along L piriformis  LUMBAR ROM:   AROM eval  Flexion 50%, L HS tightness  Extension 25%, painful on L side  Right lateral flexion WFL  Left lateral flexion 75%, painful on L side  Right rotation WFL, no pain  Left rotation WFL, pain in L side at end range   (Blank rows = not tested)  LOWER EXTREMITY ROM:     Active  Right eval Left eval  Hip flexion [facility]  Hip extension    Hip abduction    Hip adduction    Hip internal rotation    Hip external rotation    Knee flexion    Knee extension    Ankle dorsiflexion    Ankle plantarflexion    Ankle inversion    Ankle eversion     (Blank rows = not tested)  LOWER EXTREMITY MMT:    MMT Right eval Left eval  Hip flexion 5 5  Hip extension    Hip abduction 4 3  Hip adduction    Hip internal rotation    Hip external rotation    Knee flexion 5 5  Knee extension 5 5   Ankle dorsiflexion 5 2-  Ankle plantarflexion    Ankle inversion    Ankle eversion     (Blank rows = not tested)  LUMBAR SPECIAL TESTS:  Slump test: Positive  GAIT: Distance walked: various clinic distances Assistive device utilized: None Level of assistance: Modified independence Comments: increased time, antalgic  TREATMENT:   TherEx To work on B hip abductor strengthening in // bars: Lateral sidesteps 2 x 10 ft L/R  + red TB 2 x 10 ft L/R Resisted monster walks 4 x 10 ft  To work on core strengthening in supine on mat table: PPT x 10 reps with 5 sec hold + bridge 2 x 10 reps with 5 sec hold + alt L/R marches 2 x 10 reps B  SciFit multi-peaks level 3 for 8 minutes using BUE/BLEs for neural priming for reciprocal movement, dynamic cardiovascular warmup and increased amplitude of stepping. RPE of 0/10 following activity.                                                           PATIENT EDUCATION:  Education details: continue HEP, added to HEP Person educated: Patient Education method: Explanation, Demonstration, and Handouts Education comprehension: verbalized understanding, returned demonstration, and needs further education  HOME EXERCISE PROGRAM: Access Code: [email or web address] URL: [email or web address] Date: 11/29/2023 Prepared by: Lorita Rosa  Exercises - Seated Figure 4 Piriformis Stretch  - 1 x daily - 7 x weekly - 1 sets - 3-5 reps - 30 sec hold - Supine Sciatic Nerve Glide  - 1 x daily - 7 x weekly - 2 sets - 5-10 reps - Clamshell  - 1 x daily - 7 x weekly - 3 sets - 10 reps - Sidelying Hip Abduction  - 1 x daily - 7 x weekly - 3 sets - 5 reps - Side Stepping with Resistance at Ankles and Counter Support  - 1 x daily - 7 x weekly - 3 sets - 10 reps - Forward Monster Walk with Resistance at Ankles and Counter Support  - 1 x daily - 7 x weekly - 3 sets - 10 reps - Supine Posterior Pelvic Tilt  - 1 x daily - 7 x weekly - 3 sets - 10 reps - Supine  March with Posterior Pelvic Tilt  - 1 x daily - 7 x weekly - 3 sets - 10 reps - Supine Bridge  - 1 x daily - 7 x weekly - 3 sets - 10 reps  ASSESSMENT:  CLINICAL IMPRESSION: Emphasis of skilled PT session on continuing to work on hip abductor and core strengthening exercises to add to HEP. Pt challenged by supine core strengthening exercises this date. Also discussed exercise equipment that patient has at home and what would be appropriate to try as well as to monitor flare-up of pain with exercise. Pt continues to benefit from skilled PT services to work towards increased independence with management of her pain symptoms. Continue POC.    OBJECTIVE IMPAIRMENTS: Abnormal gait, decreased activity tolerance, decreased knowledge of condition, decreased mobility, difficulty walking, decreased ROM, decreased strength, impaired perceived functional ability, impaired sensation, improper body mechanics, postural dysfunction, and pain.   ACTIVITY LIMITATIONS: carrying, lifting, bending, sitting, standing, squatting, stairs, transfers, and bed mobility  PARTICIPATION LIMITATIONS: meal prep, cleaning, laundry, community activity, and occupation  PERSONAL FACTORS: Fitness and 1-2 comorbidities:   L knee OA, asthma, depression, GERD, anemia, HTNare also affecting patient's functional outcome.   REHAB POTENTIAL: Excellent  CLINICAL  DECISION MAKING: Stable/uncomplicated  EVALUATION COMPLEXITY: Low   GOALS: Goals reviewed with patient? Yes  SHORT TERM GOALS=LONG TERM GOALS due to length of POC   LONG TERM GOALS: Target date: 12/27/2023  Pt will be independent with final HEP for improved strength, ROM, and management of pain symptoms. Baseline:  Goal status: INITIAL  2.  Pt will demonstrate a (-) Slump test on L side for decreased sciatic nerve symptoms. Baseline: (+) on L side (4/9) Goal status: INITIAL  3.  Pt will improve her lumbar AROM by 25% in limited areas with no increase in pain  symptoms Baseline: see eval Goal status: INITIAL  4.  Pt will improve her L hip abd strength to 4/5 for improved function Baseline: 3/5 (4/9) Goal status: INITIAL   PLAN:  PT FREQUENCY: 1x/week  PT DURATION:  4 sessions  PLANNED INTERVENTIONS: 97164- PT Re-evaluation, 97110-Therapeutic exercises, 97530- Therapeutic activity, 97112- Neuromuscular re-education, 97535- Self Care, [address]- Manual therapy, [id number]- Gait training, [phone]- Aquatic Therapy, [phone]- Electrical stimulation (manual), Patient/Family education, Balance training, Taping, Dry Needling, Joint mobilization, Spinal mobilization, DME instructions, Cryotherapy, and Moist heat.  PLAN FOR NEXT SESSION: how is initial HEP? Add to HEP to address hip abd weakness, lumbar/core ROM and strengthening: deadlift, weighted ball exercises, bird dogs, cat/cow?, fire hydrants   Lorita Rosa, PT Lorita Rosa, PT, DPT, CSRS  12/11/2023, 8:38 AM

## 2023-12-15 ENCOUNTER — Encounter: Payer: Self-pay | Admitting: Physical Therapy

## 2023-12-15 NOTE — Therapy (Signed)
 [facility] [address] [place], [place], [address] Phone: [phone]   Fax:  [phone]  Patient Details  Name: Jennifer Quinn MRN: [id number] Date of Birth: 1956-03-09 Referring Provider:  No ref. provider found  Encounter Date: 12/15/2023   Patient called clinic wishing to be discharged due to her high copay ($50/session). Pt will be d/c from PT services at this time and will need a new referral to return in the future.   Lorita Rosa, PT Lorita Rosa, PT, DPT, CSRS  12/15/2023, 3:38 PM  [facility] [address] [place], [place], [address] Phone: [phone]   Fax:  [phone]

## 2023-12-18 ENCOUNTER — Ambulatory Visit: Admitting: Physical Therapy

## 2023-12-25 ENCOUNTER — Ambulatory Visit: Admitting: Physical Therapy

## 2024-03-14 ENCOUNTER — Other Ambulatory Visit: Payer: Self-pay | Admitting: Internal Medicine

## 2024-03-22 ENCOUNTER — Encounter: Payer: Self-pay | Admitting: Family Medicine

## 2024-03-22 ENCOUNTER — Ambulatory Visit: Admitting: Family Medicine

## 2024-03-22 VITALS — BP 142/86 | Ht 62.0 in | Wt 239.0 lb

## 2024-03-22 DIAGNOSIS — G8929 Other chronic pain: Secondary | ICD-10-CM

## 2024-03-22 DIAGNOSIS — M25562 Pain in left knee: Secondary | ICD-10-CM

## 2024-03-22 DIAGNOSIS — M1712 Unilateral primary osteoarthritis, left knee: Secondary | ICD-10-CM | POA: Diagnosis not present

## 2024-03-22 MED ORDER — METHYLPREDNISOLONE ACETATE 40 MG/ML IJ SUSP
40.0000 mg | Freq: Once | INTRAMUSCULAR | Status: AC
  Administered 2024-03-22: 40 mg via INTRA_ARTICULAR

## 2024-03-22 NOTE — Progress Notes (Addendum)
 PCP: Plotnikov, Karlynn GAILS, MD  Subjective:   HPI: Patient is a 68 y.o. female here for requesting follow-up cortisone injection for left knee osteoarthritis.  She had injection in clinic January 2025 and experienced 6 months of relief.  Denies any acute injuries in the interim since her last visit.  Says pain restarted around 1 month ago and has slowly progressed since then.  Activities such as walking or prolonged weightbearing worsen pain.  Time off of her feet improves pain.  She also finds some relief with heat/ice.  Past Medical History:  Diagnosis Date   Asthma    Chest pain    Chronic constipation    Depression    Fibroid    GERD (gastroesophageal reflux disease)    occ   History of anemia    Hypertension    PMB (postmenopausal bleeding)    Vitamin D  deficiency    Wears contact lenses    Wears partial dentures    upper and lower    Current Outpatient Medications on File Prior to Visit  Medication Sig Dispense Refill   acetaminophen  (TYLENOL ) 500 MG tablet Take 500 mg by mouth every 6 (six) hours as needed.     albuterol  (VENTOLIN  HFA) 108 (90 Base) MCG/ACT inhaler TAKE 2 PUFFS BY MOUTH EVERY 6 HOURS AS NEEDED FOR WHEEZE OR SHORTNESS OF BREATH 8.5 each 3   amLODipine  (NORVASC ) 5 MG tablet TAKE 1 TABLET (5 MG TOTAL) BY MOUTH DAILY. 90 tablet 3   buPROPion  (WELLBUTRIN ) 100 MG tablet TAKE 1 TABLET BY MOUTH EVERY DAY - DUE FOR ANNUAL APPT IN OCT 90 tablet 1   Cholecalciferol (VITAMIN D  PO) Take 1,000 Units by mouth daily.      cyclobenzaprine  (FLEXERIL ) 5 MG tablet Take 1 tablet (5 mg total) by mouth 3 (three) times daily as needed for muscle spasms. 30 tablet 1   Fish Oil OIL Take 2,000 mg by mouth daily.     metFORMIN  (GLUCOPHAGE ) 500 MG tablet Take 1 tablet (500 mg total) by mouth daily with breakfast. 90 tablet 3   potassium chloride  SA (KLOR-CON  M20) 20 MEQ tablet Take 1 tablet (20 mEq total) by mouth daily. 90 tablet 3   predniSONE  (DELTASONE ) 10 MG tablet Prednisone  10  mg: take 4 tabs a day x 3 days; then 3 tabs a day x 4 days; then 2 tabs a day x 4 days, then 1 tab a day x 6 days, then stop. Take pc. (Patient not taking: Reported on 11/29/2023) 38 tablet 1   No current facility-administered medications on file prior to visit.    Past Surgical History:  Procedure Laterality Date   COLONOSCOPY  2007, 01/25/2011. 05/25/16   DILATATION & CURETTAGE/HYSTEROSCOPY WITH MYOSURE N/A 07/23/2018   Procedure: DILATATION & CURETTAGE/HYSTEROSCOPY WITH MYOSURE;  Surgeon: Jannis Kate Norris, MD;  Location: [facility] [place];  Service: Gynecology;  Laterality: N/A;  Follow 1st case. Use [facility] virtual OR in place of [facility] block time.    No Known Allergies  BP (!) 142/86   Ht 5' 2 (1.575 m)   Wt 239 lb (108.4 kg)   BMI 43.71 kg/m       No data to display              No data to display              Objective:  Physical Exam:  Gen: NAD, comfortable in exam room  Left Knee MSK EXAM: No gross deformity, ecchymoses, swelling. TTP  along bilateral joint lines, otherwise non-tender FROM with normal strength. Notable crepitus with knee extension and flexion.  Negative ant/post drawers. Negative valgus/varus testing. Negative lachman.  Negative mcmurrays, apleys.  NV intact distally.   Assessment & Plan:  1.  Left knee osteoarthritis - Patient requesting repeat cortisone injection today.  Patient has benefited from cortisone injections in the past.  Pain relief also lasts ~6 months.  Majority of patient's symptoms occur with prolonged standing or walking.  In addition to cortisone injection which was performed at today's appointment (see procedure note below) reviewed additional conservative options including nonweightbearing exercise (cycling), oral NSAIDs, home exercise program for quadricep strengthening, weight loss, and other topical modalities such as Voltaren .  Patient may also benefit from aquatic therapy.  If steroid injection begins to be less  effective also discussed additional options such as gel shots or PRP, or eventual need for knee replacement surgery.  Left Knee Injection Procedure: After informed written consent timeout was performed, patient was in seated position on exam table.  Left knee was prepped with alcohol swab x2. Ethyl chloride spray used for topical anesthetic. Utilizing anterolateral approach and a 25g needle, the left knee joint was injected with 3:1 lidocaine :depomedrol. Following the injection a bandage was applied to the area. Patient tolerated procedure well without immediate complications. The patient was counseled as to the expected post-injection course, including the possibility of worsening of pain with steroid flare. Instructed as to concerning symptoms and advised to contact the office if these should arise.

## 2024-03-26 ENCOUNTER — Ambulatory Visit: Admitting: Nurse Practitioner

## 2024-03-26 ENCOUNTER — Encounter: Payer: Self-pay | Admitting: Nurse Practitioner

## 2024-03-26 VITALS — BP 158/86 | HR 73

## 2024-03-26 DIAGNOSIS — I10 Essential (primary) hypertension: Secondary | ICD-10-CM

## 2024-03-26 MED ORDER — HYDROCHLOROTHIAZIDE 12.5 MG PO TABS: 12.5000 mg | ORAL_TABLET | Freq: Every day | ORAL | 0 refills | Status: AC

## 2024-03-26 NOTE — Progress Notes (Signed)
  Acute Office Visit  Subjective:     Patient ID: Jennifer Quinn, female    DOB: 07/28/1956, 68 y.o.   MRN: [id number]  Chief Complaint  Patient presents with   Medication Refill   Hypertension   Patient is in today for medication refill. Hx HTN on amlodipine , on 5 mg daily. Takes hydrochlorothiazide  12.5mg  daily if needed for fluid retention. She is out of this prescription and requesting refill. She currently has prescription for potassium po 10 which she takes along with her hydrochlorothiazide  to prevent muscle cramps.   Over the weekend, she experienced blurred vision and headaches, reports sbp was in the 180's. She took a hydrochlorothiazide  which improved reading but then realized she was out of the medication. She noticed some fluid retention is her legs with improved with hydrochlorothiazide  dose. Admits to eating high sodium meals over the past view weeks and since elevated BP reading she has limited this.    Review of Systems  Eyes:  Negative for blurred vision.  Respiratory:  Negative for cough and shortness of breath.   Cardiovascular:  Positive for leg swelling. Negative for chest pain.  Neurological:  Positive for headaches. Negative for dizziness.        Objective:    BP (!) 158/86 (BP Location: Left Arm, Patient Position: Sitting, Cuff Size: Normal)   Pulse 73   SpO2 97%    Physical Exam Constitutional:      General: She is not in acute distress. HENT:     Head: Normocephalic.  Cardiovascular:     Rate and Rhythm: Normal rate and regular rhythm.     Heart sounds: Normal heart sounds.  Pulmonary:     Effort: Pulmonary effort is normal. No respiratory distress.     Breath sounds: Normal breath sounds.  Musculoskeletal:        General: Normal range of motion.     Right lower leg: No edema.     Left lower leg: No edema.  Neurological:     General: No focal deficit present.     Mental Status: She is alert and oriented to person, place, and time.   Psychiatric:        Mood and Affect: Mood normal.        Behavior: Behavior normal.     No results found for any visits on 03/26/24.      Assessment & Plan:   Problem List Items Addressed This Visit   None Visit Diagnoses       Essential hypertension    -  Primary   Relevant Medications   hydrochlorothiazide  (HYDRODIURIL ) 12.5 MG tablet     Refill medication today. Instructed to take as prescribed. Encouraged DASH diet and continue monitoring BP levels at home. Return in 2 weeks for blood pressure check.   Meds ordered this encounter  Medications   hydrochlorothiazide  (HYDRODIURIL ) 12.5 MG tablet    Sig: Take 1 tablet (12.5 mg total) by mouth daily.    Dispense:  90 tablet    Refill:  0    Supervising Provider:   BACIGALUPO, [name] M [[id number]]     Dorthea Dionisio Quaker, NP

## 2024-05-02 ENCOUNTER — Ambulatory Visit: Admitting: Internal Medicine

## 2024-05-02 ENCOUNTER — Encounter: Payer: Self-pay | Admitting: Internal Medicine

## 2024-05-02 VITALS — BP 125/80 | HR 76 | Temp 97.8°F | Ht 62.0 in | Wt 242.2 lb

## 2024-05-02 DIAGNOSIS — N183 Chronic kidney disease, stage 3 unspecified: Secondary | ICD-10-CM | POA: Diagnosis not present

## 2024-05-02 DIAGNOSIS — E119 Type 2 diabetes mellitus without complications: Secondary | ICD-10-CM | POA: Diagnosis not present

## 2024-05-02 DIAGNOSIS — E559 Vitamin D deficiency, unspecified: Secondary | ICD-10-CM | POA: Diagnosis not present

## 2024-05-02 DIAGNOSIS — Z7984 Long term (current) use of oral hypoglycemic drugs: Secondary | ICD-10-CM | POA: Diagnosis not present

## 2024-05-02 DIAGNOSIS — K219 Gastro-esophageal reflux disease without esophagitis: Secondary | ICD-10-CM

## 2024-05-02 DIAGNOSIS — I2510 Atherosclerotic heart disease of native coronary artery without angina pectoris: Secondary | ICD-10-CM

## 2024-05-02 DIAGNOSIS — R55 Syncope and collapse: Secondary | ICD-10-CM

## 2024-05-02 LAB — COMPREHENSIVE METABOLIC PANEL WITH GFR
ALT: 12 U/L (ref 0–35)
AST: 17 U/L (ref 0–37)
Albumin: 4 g/dL (ref 3.5–5.2)
Alkaline Phosphatase: 60 U/L (ref 39–117)
BUN: 17 mg/dL (ref 6–23)
CO2: 29 meq/L (ref 19–32)
Calcium: 9.4 mg/dL (ref 8.4–10.5)
Chloride: 102 meq/L (ref 96–112)
Creatinine, Ser: 1.04 mg/dL (ref 0.40–1.20)
GFR: 54.66 mL/min — ABNORMAL LOW (ref >60.00)
Glucose, Bld: 95 mg/dL (ref 70–99)
Potassium: 4.1 meq/L (ref 3.5–5.1)
Sodium: 139 meq/L (ref 135–145)
Total Bilirubin: 0.6 mg/dL (ref 0.2–1.2)
Total Protein: 6.8 g/dL (ref 6.0–8.3)

## 2024-05-02 LAB — URINALYSIS
Bilirubin Urine: NEGATIVE
Hgb urine dipstick: NEGATIVE
Ketones, ur: NEGATIVE
Leukocytes,Ua: NEGATIVE
Nitrite: NEGATIVE
Specific Gravity, Urine: <=1.005 — AB (ref 1.000–1.030)
Total Protein, Urine: NEGATIVE
Urine Glucose: NEGATIVE
Urobilinogen, UA: 0.2 (ref 0.0–1.0)
pH: 6.5 (ref 5.0–8.0)

## 2024-05-02 LAB — HEMOGLOBIN A1C: Hgb A1c MFr Bld: 6.3 % (ref 4.6–6.5)

## 2024-05-02 LAB — CBC WITH DIFFERENTIAL/PLATELET
Basophils Absolute: 0 K/uL (ref 0.0–0.1)
Basophils Relative: 0.9 % (ref 0.0–3.0)
Eosinophils Absolute: 0.2 K/uL (ref 0.0–0.7)
Eosinophils Relative: 5.3 % — ABNORMAL HIGH (ref 0.0–5.0)
HCT: 39 % (ref 36.0–46.0)
Hemoglobin: 12.9 g/dL (ref 12.0–15.0)
Lymphocytes Relative: 35.5 % (ref 12.0–46.0)
Lymphs Abs: 1.7 K/uL (ref 0.7–4.0)
MCHC: 33.1 g/dL (ref 30.0–36.0)
MCV: 82.7 fl (ref 78.0–100.0)
Monocytes Absolute: 0.4 K/uL (ref 0.1–1.0)
Monocytes Relative: 8.9 % (ref 3.0–12.0)
Neutro Abs: 2.3 K/uL (ref 1.4–7.7)
Neutrophils Relative %: 49.4 % (ref 43.0–77.0)
Platelets: 234 K/uL (ref 150.0–400.0)
RBC: 4.72 Mil/uL (ref 3.87–5.11)
RDW: 14.6 % (ref 11.5–15.5)
WBC: 4.6 K/uL (ref 4.0–10.5)

## 2024-05-02 LAB — LIPID PANEL
Cholesterol: 198 mg/dL (ref 0–200)
HDL: 43.2 mg/dL (ref >39.00)
LDL Cholesterol: 137 mg/dL — ABNORMAL HIGH (ref 0–99)
NonHDL: 154.6
Total CHOL/HDL Ratio: 5
Triglycerides: 86 mg/dL (ref 0.0–149.0)
VLDL: 17.2 mg/dL (ref 0.0–40.0)

## 2024-05-02 LAB — TSH: TSH: 2.32 u[IU]/mL (ref 0.35–5.50)

## 2024-05-02 MED ORDER — TIRZEPATIDE 2.5 MG/0.5ML ~~LOC~~ SOAJ: 2.5000 mg | SUBCUTANEOUS | 3 refills | Status: DC

## 2024-05-02 MED ORDER — PANTOPRAZOLE SODIUM 40 MG PO TBEC: 40.0000 mg | DELAYED_RELEASE_TABLET | Freq: Every day | ORAL | 3 refills | Status: AC

## 2024-05-02 NOTE — Assessment & Plan Note (Signed)
?  Etiology Will get an ECHO Labs To ER if worse

## 2024-05-02 NOTE — Assessment & Plan Note (Signed)
 On Vit D

## 2024-05-02 NOTE — Progress Notes (Signed)
 Subjective:  Patient ID: Jennifer Quinn, female    DOB: 06-Oct-1955  Age: 68 y.o. MRN: [id number]  CC: Dizziness (Dizziness, right sided ear fullness for 2 weeks)   HPI Jennifer Quinn presents for lightheadedness, DM, HTN Not on Mounjaro  - gained wt C/o GERD sx's - taking TUMS Pt gained wt - Mounjaro  Rx did not go through  Outpatient Medications Prior to Visit  Medication Sig Dispense Refill   acetaminophen  (TYLENOL ) 500 MG tablet Take 500 mg by mouth every 6 (six) hours as needed.     albuterol  (VENTOLIN  HFA) 108 (90 Base) MCG/ACT inhaler TAKE 2 PUFFS BY MOUTH EVERY 6 HOURS AS NEEDED FOR WHEEZE OR SHORTNESS OF BREATH 8.5 each 3   amLODipine  (NORVASC ) 5 MG tablet TAKE 1 TABLET (5 MG TOTAL) BY MOUTH DAILY. 90 tablet 3   buPROPion  (WELLBUTRIN ) 100 MG tablet TAKE 1 TABLET BY MOUTH EVERY DAY - DUE FOR ANNUAL APPT IN OCT 90 tablet 1   Cholecalciferol (VITAMIN D  PO) Take 1,000 Units by mouth daily.      Fish Oil OIL Take 2,000 mg by mouth daily.     hydrochlorothiazide  (HYDRODIURIL ) 12.5 MG tablet Take 1 tablet (12.5 mg total) by mouth daily. 90 tablet 0   metFORMIN  (GLUCOPHAGE ) 500 MG tablet Take 1 tablet (500 mg total) by mouth daily with breakfast. 90 tablet 3   potassium chloride  SA (KLOR-CON  M20) 20 MEQ tablet Take 1 tablet (20 mEq total) by mouth daily. 90 tablet 3   cyclobenzaprine  (FLEXERIL ) 5 MG tablet Take 1 tablet (5 mg total) by mouth 3 (three) times daily as needed for muscle spasms. (Patient not taking: Reported on 05/02/2024) 30 tablet 1   predniSONE  (DELTASONE ) 10 MG tablet Prednisone  10 mg: take 4 tabs a day x 3 days; then 3 tabs a day x 4 days; then 2 tabs a day x 4 days, then 1 tab a day x 6 days, then stop. Take pc. (Patient not taking: Reported on 03/26/2024) 38 tablet 1   No facility-administered medications prior to visit.    ROS: Review of Systems  Constitutional:  Positive for unexpected weight change. Negative for activity change, appetite change, chills and fatigue.   HENT:  Negative for congestion, mouth sores and sinus pressure.   Eyes:  Negative for visual disturbance.  Respiratory:  Negative for cough and chest tightness.   Gastrointestinal:  Negative for abdominal pain and nausea.  Genitourinary:  Negative for difficulty urinating, frequency and vaginal pain.  Musculoskeletal:  Negative for back pain and gait problem.  Skin:  Negative for pallor and rash.  Neurological:  Positive for light-headedness. Negative for dizziness, tremors, weakness, numbness and headaches.  Psychiatric/Behavioral:  Negative for confusion, sleep disturbance and suicidal ideas.     Objective:  BP 125/80 (Patient Position: Standing, Cuff Size: Large)   Pulse 76   Temp 97.8 F (36.6 C)   Ht 5' 2 (1.575 m)   Wt 242 lb 3.2 oz (109.9 kg)   SpO2 98%   BMI 44.30 kg/m   BP Readings from Last 3 Encounters:  05/02/24 125/80  03/26/24 (!) 158/86  03/22/24 (!) 142/86    Wt Readings from Last 3 Encounters:  05/02/24 242 lb 3.2 oz (109.9 kg)  03/22/24 239 lb (108.4 kg)  11/07/23 234 lb (106.1 kg)    Physical Exam Constitutional:      General: She is not in acute distress.    Appearance: She is well-developed. She is obese. She is not toxic-appearing.  HENT:     Head: Normocephalic.     Right Ear: External ear normal. There is no impacted cerumen.     Left Ear: External ear normal. There is no impacted cerumen.     Nose: Nose normal.  Eyes:     General:        Right eye: No discharge.        Left eye: No discharge.     Conjunctiva/sclera: Conjunctivae normal.     Pupils: Pupils are equal, round, and reactive to light.  Neck:     Thyroid : No thyromegaly.     Vascular: No JVD.     Trachea: No tracheal deviation.  Cardiovascular:     Rate and Rhythm: Normal rate and regular rhythm.     Heart sounds: Normal heart sounds.  Pulmonary:     Effort: No respiratory distress.     Breath sounds: No stridor. No wheezing.  Abdominal:     General: Bowel sounds are  normal. There is no distension.     Palpations: Abdomen is soft. There is no mass.     Tenderness: There is no abdominal tenderness. There is no guarding or rebound.  Musculoskeletal:        General: No tenderness.     Cervical back: Normal range of motion and neck supple. No rigidity.  Lymphadenopathy:     Cervical: No cervical adenopathy.  Skin:    Findings: No erythema or rash.  Neurological:     Cranial Nerves: No cranial nerve deficit.     Motor: No abnormal muscle tone.     Coordination: Coordination normal.     Deep Tendon Reflexes: Reflexes normal.  Psychiatric:        Behavior: Behavior normal.        Thought Content: Thought content normal.        Judgment: Judgment normal.    No orthostatic sx's   Lab Results  Component Value Date   WBC 6.4 06/21/2022   HGB 13.1 06/21/2022   HCT 40.0 06/21/2022   PLT 244.0 06/21/2022   GLUCOSE 96 08/24/2023   CHOL 164 12/19/2022   TRIG 138.0 12/19/2022   HDL 37.30 (L) 12/19/2022   LDLDIRECT 157.0 05/09/2018   LDLCALC 99 12/19/2022   ALT 11 08/24/2023   AST 14 08/24/2023   NA 141 08/24/2023   K 4.2 08/24/2023   CL 103 08/24/2023   CREATININE 1.00 08/24/2023   BUN 15 08/24/2023   CO2 30 08/24/2023   TSH 2.12 06/21/2022   HGBA1C 5.9 08/24/2023    No results found.  Assessment & Plan:   Problem List Items Addressed This Visit     Coronary atherosclerosis   Coronary calcium CT score is 6.51. On fish oil      CRI (chronic renal insufficiency), stage 3 (moderate) (HCC)   GFR is decreased at 49.31 -hydrate yourself well.  We can start either one of 2 medications (Farxiga or Kerendia) to protect kidneys better.       Relevant Orders   TSH   Urinalysis   CBC with Differential/Platelet   Lipid panel   Comprehensive metabolic panel with GFR   Hemoglobin A1c   Diabetes (HCC)   Off Ozempic  x 2 mo due to Insurance decision to stop it Will start at low dose Mounjaro       Relevant Medications   tirzepatide   (MOUNJARO ) 2.5 MG/0.5ML Pen   Other Relevant Orders   POCT Urine microalbumin-creatinine with uACR   GERD (gastroesophageal reflux disease)  Start Protonix       Relevant Medications   pantoprazole  (PROTONIX ) 40 MG tablet   Near syncope - Primary   ?Etiology Will get an ECHO Labs To ER if worse      Relevant Orders   TSH   Urinalysis   CBC with Differential/Platelet   Lipid panel   Comprehensive metabolic panel with GFR   Hemoglobin A1c   ECHOCARDIOGRAM COMPLETE   Vitamin D  deficiency   On Vit D      Relevant Orders   TSH   Urinalysis   CBC with Differential/Platelet   Lipid panel   Comprehensive metabolic panel with GFR   Hemoglobin A1c      Meds ordered this encounter  Medications   pantoprazole  (PROTONIX ) 40 MG tablet    Sig: Take 1 tablet (40 mg total) by mouth daily.    Dispense:  90 tablet    Refill:  3   tirzepatide  (MOUNJARO ) 2.5 MG/0.5ML Pen    Sig: Inject 2.5 mg into the skin once a week.    Dispense:  2 mL    Refill:  3      Follow-up: Return in about 4 weeks (around 05/30/2024) for a follow-up visit.  Marolyn Noel, MD

## 2024-05-02 NOTE — Assessment & Plan Note (Signed)
Start Protonix 

## 2024-05-02 NOTE — Assessment & Plan Note (Signed)
Coronary calcium CT score is 6.51. On fish oil

## 2024-05-02 NOTE — Assessment & Plan Note (Signed)
GFR is decreased at 49.31 -hydrate yourself well.  We can start either one of 2 medications (Farxiga or [place]) to protect kidneys better.

## 2024-05-02 NOTE — Assessment & Plan Note (Signed)
 Off Ozempic  x 2 mo due to Insurance decision to stop it Will start at low dose Mounjaro 

## 2024-05-06 ENCOUNTER — Encounter: Payer: Self-pay | Admitting: Internal Medicine

## 2024-05-07 ENCOUNTER — Ambulatory Visit: Payer: Self-pay | Admitting: Internal Medicine

## 2024-05-09 ENCOUNTER — Other Ambulatory Visit (HOSPITAL_COMMUNITY): Payer: Self-pay

## 2024-05-09 ENCOUNTER — Telehealth: Payer: Self-pay

## 2024-05-09 NOTE — Telephone Encounter (Signed)
 Message sent to patient.   Curtistine Quiet, CMA

## 2024-05-09 NOTE — Telephone Encounter (Signed)
 Pharmacy Patient Advocate Encounter   Received notification from Patient Advice Request messages that prior authorization for Mounjaro  2.5MG /0.5ML auto-injectors is required/requested.   Insurance verification completed.   The patient is insured through [employer] .    Ozempic /Mounjaro  is approved exclusively as an adjunct to diet and exercise to improve glycemic  control in adults with type 2 diabetes mellitus. A review of patient's medical chart reveals no  documented diagnosis of type 2 diabetes or an A1C indicative of diabetes. Therefore, they do not  currently meet the criteria for prior authorization of this medication. If clinically appropriate, alternative  options such as Saxenda, Zepbound , or Wegovy  may be considered for this patient.

## 2024-05-28 ENCOUNTER — Encounter: Payer: Self-pay | Admitting: Internal Medicine

## 2024-06-01 ENCOUNTER — Other Ambulatory Visit: Payer: Self-pay | Admitting: Internal Medicine

## 2024-06-01 MED ORDER — TIRZEPATIDE 5 MG/0.5ML ~~LOC~~ SOAJ: 5.0000 mg | SUBCUTANEOUS | 3 refills | Status: DC

## 2024-06-03 ENCOUNTER — Telehealth: Payer: Self-pay

## 2024-06-03 ENCOUNTER — Other Ambulatory Visit (HOSPITAL_COMMUNITY): Payer: Self-pay

## 2024-06-03 NOTE — Telephone Encounter (Signed)
 Pharmacy Patient Advocate Encounter   Received notification from Pt Calls Messages that prior authorization for Mounjaro  5mg /0.33ml is required/requested.   Insurance verification completed.   The patient is insured through [employer].   Ozempic /Mounjaro  is approved exclusively as an adjunct to diet and exercise to improve glycemic  control in adults with type 2 diabetes mellitus. A review of patient's medical chart reveals no documented diagnosis of type 2 diabetes or an A1C indicative of diabetes. Therefore, they do not  currently meet the criteria for prior authorization of this medication. If clinically appropriate, alternative options such as Saxenda, Zepbound , or Wegovy  may be considered for this patient.   The insurance will not cover Mounjaro  without a diagnosis of diabetes but she is welcome to continue to pay for it out of pocket for the Mounjaro . The cash price at one of [employer] is $1,370.51.

## 2024-06-04 NOTE — Telephone Encounter (Signed)
 Noted.  Please inform the patient.  Would you like us  to send a prescription for Zepbound ?  Thanks

## 2024-06-10 ENCOUNTER — Other Ambulatory Visit (HOSPITAL_COMMUNITY): Payer: Self-pay

## 2024-06-10 MED ORDER — FLUZONE HIGH-DOSE 0.5 ML IM SUSY
0.5000 mL | PREFILLED_SYRINGE | Freq: Once | INTRAMUSCULAR | 0 refills | Status: AC
  Filled 2024-06-10: qty 0.5, 1d supply, fill #0

## 2024-06-19 ENCOUNTER — Encounter: Payer: Self-pay | Admitting: Internal Medicine

## 2024-06-30 ENCOUNTER — Other Ambulatory Visit: Payer: Self-pay | Admitting: Nurse Practitioner

## 2024-06-30 DIAGNOSIS — I10 Essential (primary) hypertension: Secondary | ICD-10-CM

## 2024-07-22 ENCOUNTER — Telehealth: Payer: Self-pay

## 2024-07-22 NOTE — Telephone Encounter (Signed)
 Copied from CRM #[id number]. Topic: Appointments - Scheduling Inquiry for Clinic >> Jul 22, 2024 10:22 AM Tinnie BROCKS wrote: Reason for CRM: Pt needs an appointment to manage Mounjaro . She will be out of this on 12/9 of the 2.5 and is wanting to start the 5mg  dose. Dr. Garald is booked until 1/14. Can this patient see any other provider to get the OK to go up on her mounjaro  so she doesn't miss doses and have to start all over. Pt requesting call back at [phone].

## 2024-07-23 ENCOUNTER — Other Ambulatory Visit: Payer: Self-pay | Admitting: Internal Medicine

## 2024-07-23 MED ORDER — TIRZEPATIDE 7.5 MG/0.5ML ~~LOC~~ SOAJ: 7.5000 mg | SUBCUTANEOUS | 3 refills | Status: DC

## 2024-07-23 NOTE — Progress Notes (Signed)
Mounjaro prescription

## 2024-07-23 NOTE — Telephone Encounter (Signed)
 I will send the next dose.  Schedule return office visit.  Thanks

## 2024-07-25 ENCOUNTER — Encounter: Payer: Self-pay | Admitting: Internal Medicine

## 2024-07-25 NOTE — Addendum Note (Signed)
 Addended by: HEDDY IP R on: 07/25/2024 02:17 PM   Modules accepted: Orders

## 2024-07-28 MED ORDER — TIRZEPATIDE 7.5 MG/0.5ML ~~LOC~~ SOAJ: 7.5000 mg | SUBCUTANEOUS | 3 refills | Status: DC

## 2024-07-29 NOTE — Addendum Note (Signed)
 Addended by: HEDDY IP R on: 07/29/2024 10:06 AM   Modules accepted: Orders

## 2024-08-05 ENCOUNTER — Other Ambulatory Visit (HOSPITAL_COMMUNITY): Payer: Self-pay

## 2024-08-06 ENCOUNTER — Other Ambulatory Visit (HOSPITAL_COMMUNITY): Payer: Self-pay

## 2024-08-20 ENCOUNTER — Ambulatory Visit: Admitting: Student

## 2024-08-20 ENCOUNTER — Encounter: Payer: Self-pay | Admitting: Student

## 2024-08-20 MED ORDER — CYCLOBENZAPRINE HCL 5 MG PO TABS: 5.0000 mg | ORAL_TABLET | Freq: Three times a day (TID) | ORAL | 0 refills | Status: AC | PRN

## 2024-08-20 MED ORDER — METHYLPREDNISOLONE 4 MG PO TBPK: ORAL_TABLET | ORAL | 0 refills | Status: AC

## 2024-08-20 NOTE — Progress Notes (Signed)
" ° °  Acute Office Visit  Subjective:     Patient ID: Jennifer Quinn, female    DOB: February 23, 1956, 68 y.o.   MRN: [id number]  No chief complaint on file.   HPI Patient is in today for evaluation of acute exacerbation of previously diagnosed lumbar radiculopathy. Complains of pain in the right lower lumbar spine which radiates to the posterior aspect of thigh and calf that has been present for approximately 1 week with gradual worsening.  She has had similar complaints in the past that responded well to steroid and muscle relaxers.  Complains of sensation of weakness at times in her right leg.    ROS      Objective:    There were no vitals taken for this visit.   Physical Exam Constitutional:      General: She is not in acute distress.    Appearance: Normal appearance. She is not ill-appearing or toxic-appearing.  HENT:     Head: Normocephalic.  Neurological:     Mental Status: She is alert.   Strength: 5/5 bilateral hip flexion/extension, internal and external hip rotation, knee flexion/extension, dorsiflexion. Neurovascular: Sensation intact to light touch throughout the lower extremities bilaterally. Straight leg raise negative  No results found for any visits on 08/20/24.      Assessment & Plan:   Patient presents today for evaluation of recent flareup of lumbar radiculopathy complaints.  Complains of pain through the posterior aspect of right leg with no associated trauma or injury.  Symptoms have been gradually worsening since initial presentation.  She has had similar complaints in the past that resolved with oral steroids and muscle relaxer.  No red flag symptoms at this time.  Will repeat oral steroid muscle relaxers and this is helped in the past.  Side effects were discussed.  Patient will present back to clinic as needed if symptoms do not improve.  Problem List Items Addressed This Visit   None Visit Diagnoses       Lumbar radiculopathy    -  Primary   Relevant  Medications   methylPREDNISolone  (MEDROL  DOSEPAK) 4 MG TBPK tablet   cyclobenzaprine  (FLEXERIL ) 5 MG tablet       Meds ordered this encounter  Medications   methylPREDNISolone  (MEDROL  DOSEPAK) 4 MG TBPK tablet    Sig: Take as instructed    Dispense:  1 each    Refill:  0    Supervising Provider:   BACIGALUPO, [name] M [[id number]]   cyclobenzaprine  (FLEXERIL ) 5 MG tablet    Sig: Take 1 tablet (5 mg total) by mouth 3 (three) times daily as needed for up to 14 days for muscle spasms.    Dispense:  42 tablet    Refill:  0    Supervising Provider:   BACIGALUPO, [name] M [[id number]]    No follow-ups on file.  Alan Showers, PA-C   "

## 2024-08-29 ENCOUNTER — Ambulatory Visit: Admitting: Internal Medicine

## 2024-08-29 ENCOUNTER — Encounter: Payer: Self-pay | Admitting: Internal Medicine

## 2024-08-29 VITALS — BP 150/70 | HR 67 | Temp 98.0°F | Ht 62.0 in | Wt 238.0 lb

## 2024-08-29 DIAGNOSIS — E119 Type 2 diabetes mellitus without complications: Secondary | ICD-10-CM

## 2024-08-29 DIAGNOSIS — Z7985 Long-term (current) use of injectable non-insulin antidiabetic drugs: Secondary | ICD-10-CM

## 2024-08-29 DIAGNOSIS — Z0000 Encounter for general adult medical examination without abnormal findings: Secondary | ICD-10-CM

## 2024-08-29 LAB — LIPID PANEL
Cholesterol: 201 mg/dL — ABNORMAL HIGH (ref 28–200)
HDL: 48.9 mg/dL
LDL Cholesterol: 116 mg/dL — ABNORMAL HIGH (ref 10–99)
NonHDL: 151.96
Total CHOL/HDL Ratio: 4
Triglycerides: 180 mg/dL — ABNORMAL HIGH (ref 10.0–149.0)
VLDL: 36 mg/dL (ref 0.0–40.0)

## 2024-08-29 LAB — URINALYSIS, ROUTINE W REFLEX MICROSCOPIC
Bilirubin Urine: NEGATIVE
Hgb urine dipstick: NEGATIVE
Ketones, ur: NEGATIVE
Nitrite: NEGATIVE
RBC / HPF: NONE SEEN
Specific Gravity, Urine: 1.01 (ref 1.000–1.030)
Total Protein, Urine: NEGATIVE
Urine Glucose: NEGATIVE
Urobilinogen, UA: 0.2 (ref 0.0–1.0)
pH: 7 (ref 5.0–8.0)

## 2024-08-29 LAB — CBC WITH DIFFERENTIAL/PLATELET
Basophils Absolute: 0.1 K/uL (ref 0.0–0.1)
Basophils Relative: 0.9 % (ref 0.0–3.0)
Eosinophils Absolute: 0 K/uL (ref 0.0–0.7)
Eosinophils Relative: 0.6 % (ref 0.0–5.0)
HCT: 40.7 % (ref 36.0–46.0)
Hemoglobin: 13.3 g/dL (ref 12.0–15.0)
Lymphocytes Relative: 24.7 % (ref 12.0–46.0)
Lymphs Abs: 1.6 K/uL (ref 0.7–4.0)
MCHC: 32.7 g/dL (ref 30.0–36.0)
MCV: 82.6 fl (ref 78.0–100.0)
Monocytes Absolute: 0.5 K/uL (ref 0.1–1.0)
Monocytes Relative: 7.7 % (ref 3.0–12.0)
Neutro Abs: 4.3 K/uL (ref 1.4–7.7)
Neutrophils Relative %: 66.1 % (ref 43.0–77.0)
Platelets: 240 K/uL (ref 150.0–400.0)
RBC: 4.93 Mil/uL (ref 3.87–5.11)
RDW: 15.1 % (ref 11.5–15.5)
WBC: 6.5 K/uL (ref 4.0–10.5)

## 2024-08-29 LAB — COMPREHENSIVE METABOLIC PANEL WITH GFR
ALT: 14 U/L (ref 3–35)
AST: 15 U/L (ref 5–37)
Albumin: 4.2 g/dL (ref 3.5–5.2)
Alkaline Phosphatase: 59 U/L (ref 39–117)
BUN: 15 mg/dL (ref 6–23)
CO2: 33 meq/L — ABNORMAL HIGH (ref 19–32)
Calcium: 9 mg/dL (ref 8.4–10.5)
Chloride: 101 meq/L (ref 96–112)
Creatinine, Ser: 1.06 mg/dL (ref 0.40–1.20)
GFR: 53.3 mL/min — ABNORMAL LOW
Glucose, Bld: 84 mg/dL (ref 70–99)
Potassium: 3.7 meq/L (ref 3.5–5.1)
Sodium: 141 meq/L (ref 135–145)
Total Bilirubin: 0.6 mg/dL (ref 0.2–1.2)
Total Protein: 6.9 g/dL (ref 6.0–8.3)

## 2024-08-29 LAB — TSH: TSH: 1.63 u[IU]/mL (ref 0.35–5.50)

## 2024-08-29 LAB — HEMOGLOBIN A1C: Hgb A1c MFr Bld: 5.6 % (ref 4.6–6.5)

## 2024-08-29 MED ORDER — ZEPBOUND 10 MG/0.5ML ~~LOC~~ SOAJ: 10.0000 mg | SUBCUTANEOUS | 11 refills | Status: DC

## 2024-08-29 MED ORDER — TIRZEPATIDE 10 MG/0.5ML ~~LOC~~ SOAJ: 10.0000 mg | SUBCUTANEOUS | 11 refills | Status: DC

## 2024-08-29 NOTE — Assessment & Plan Note (Signed)
Increase Mounjaro to 10 mg/wk

## 2024-08-29 NOTE — Progress Notes (Signed)
 "  Subjective:  Patient ID: Jennifer Quinn, female    DOB: 1956-02-26  Age: 69 y.o. MRN: [id number]  CC: Annual Exam (Mounjaro  dose increase. )   HPI Jennifer Quinn presents for HTN, DM Well exam  Outpatient Medications Prior to Visit  Medication Sig Dispense Refill   acetaminophen  (TYLENOL ) 500 MG tablet Take 500 mg by mouth every 6 (six) hours as needed.     albuterol  (VENTOLIN  HFA) 108 (90 Base) MCG/ACT inhaler TAKE 2 PUFFS BY MOUTH EVERY 6 HOURS AS NEEDED FOR WHEEZE OR SHORTNESS OF BREATH 8.5 each 3   buPROPion  (WELLBUTRIN ) 100 MG tablet TAKE 1 TABLET BY MOUTH EVERY DAY - DUE FOR ANNUAL APPT IN OCT 90 tablet 1   Cholecalciferol (VITAMIN D  PO) Take 1,000 Units by mouth daily.      cyclobenzaprine  (FLEXERIL ) 5 MG tablet Take 1 tablet (5 mg total) by mouth 3 (three) times daily as needed for up to 14 days for muscle spasms. 42 tablet 0   Fish Oil OIL Take 2,000 mg by mouth daily.     hydrochlorothiazide  (HYDRODIURIL ) 12.5 MG tablet Take 1 tablet (12.5 mg total) by mouth daily. 90 tablet 0   metFORMIN  (GLUCOPHAGE ) 500 MG tablet Take 1 tablet (500 mg total) by mouth daily with breakfast. 90 tablet 3   methylPREDNISolone  (MEDROL  DOSEPAK) 4 MG TBPK tablet Take as instructed 1 each 0   pantoprazole  (PROTONIX ) 40 MG tablet Take 1 tablet (40 mg total) by mouth daily. 90 tablet 3   potassium chloride  SA (KLOR-CON  M20) 20 MEQ tablet Take 1 tablet (20 mEq total) by mouth daily. 90 tablet 3   amLODipine  (NORVASC ) 5 MG tablet TAKE 1 TABLET (5 MG TOTAL) BY MOUTH DAILY. 90 tablet 3   tirzepatide  (MOUNJARO ) 7.5 MG/0.5ML Pen Inject 7.5 mg into the skin once a week. 2 mL 3   tirzepatide  (MOUNJARO ) 7.5 MG/0.5ML Pen Inject 7.5 mg into the skin once a week. 2 mL 3   No facility-administered medications prior to visit.    ROS: Review of Systems  Constitutional:  Negative for activity change, appetite change, chills, fatigue and unexpected weight change.  HENT:  Negative for congestion, mouth sores and  sinus pressure.   Eyes:  Negative for visual disturbance.  Respiratory:  Negative for cough and chest tightness.   Gastrointestinal:  Negative for abdominal pain and nausea.  Genitourinary:  Negative for difficulty urinating, frequency and vaginal pain.  Musculoskeletal:  Negative for back pain and gait problem.  Skin:  Negative for pallor and rash.  Neurological:  Negative for dizziness, tremors, weakness, numbness and headaches.  Psychiatric/Behavioral:  Negative for confusion and sleep disturbance.     Objective:  BP (!) 150/70   Pulse 67   Temp 98 F (36.7 C) (Oral)   Ht 5' 2 (1.575 m)   Wt 238 lb (108 kg)   SpO2 95%   BMI 43.53 kg/m   BP Readings from Last 3 Encounters:  08/29/24 (!) 150/70  05/02/24 125/80  03/26/24 (!) 158/86    Wt Readings from Last 3 Encounters:  08/29/24 238 lb (108 kg)  05/02/24 242 lb 3.2 oz (109.9 kg)  03/22/24 239 lb (108.4 kg)    Physical Exam Constitutional:      General: She is not in acute distress.    Appearance: She is well-developed. She is obese.  HENT:     Head: Normocephalic.     Right Ear: External ear normal.     Left Ear: External  ear normal.     Nose: Nose normal.  Eyes:     General:        Right eye: No discharge.        Left eye: No discharge.     Conjunctiva/sclera: Conjunctivae normal.     Pupils: Pupils are equal, round, and reactive to light.  Neck:     Thyroid : No thyromegaly.     Vascular: No JVD.     Trachea: No tracheal deviation.  Cardiovascular:     Rate and Rhythm: Normal rate and regular rhythm.     Heart sounds: Normal heart sounds.  Pulmonary:     Effort: No respiratory distress.     Breath sounds: No stridor. No wheezing.  Abdominal:     General: Bowel sounds are normal. There is no distension.     Palpations: Abdomen is soft. There is no mass.     Tenderness: There is no abdominal tenderness. There is no guarding or rebound.  Musculoskeletal:        General: No tenderness.     Cervical  back: Normal range of motion and neck supple. No rigidity.  Lymphadenopathy:     Cervical: No cervical adenopathy.  Skin:    Findings: No erythema or rash.  Neurological:     Cranial Nerves: No cranial nerve deficit.     Motor: No abnormal muscle tone.     Coordination: Coordination normal.     Deep Tendon Reflexes: Reflexes normal.  Psychiatric:        Behavior: Behavior normal.        Thought Content: Thought content normal.        Judgment: Judgment normal.     Lab Results  Component Value Date   WBC 6.5 08/29/2024   HGB 13.3 08/29/2024   HCT 40.7 08/29/2024   PLT 240.0 08/29/2024   GLUCOSE 84 08/29/2024   CHOL 201 (H) 08/29/2024   TRIG 180.0 (H) 08/29/2024   HDL 48.90 08/29/2024   LDLDIRECT 157.0 05/09/2018   LDLCALC 116 (H) 08/29/2024   ALT 14 08/29/2024   AST 15 08/29/2024   NA 141 08/29/2024   K 3.7 08/29/2024   CL 101 08/29/2024   CREATININE 1.06 08/29/2024   BUN 15 08/29/2024   CO2 33 (H) 08/29/2024   TSH 1.63 08/29/2024   HGBA1C 5.6 08/29/2024    No results found.  Assessment & Plan:   Problem List Items Addressed This Visit     OBESITY, MORBID   Diet, fasting On Zepbound  pens (patient's choice) Continue with weight loss effort      Relevant Medications   tirzepatide  (ZEPBOUND ) 10 MG/0.5ML Pen   tirzepatide  (MOUNJARO ) 10 MG/0.5ML Pen   Well adult exam - Primary   We discussed age appropriate health related issues, including available/recomended screening tests and vaccinations. We discussed a need for adhering to healthy diet and exercise. Labs ordered. All questions were answered. She has a GYN Dr Leverne q 12 mo Colon up to date Coronary calcium CT score of 6.51 Agatston units 2020 2/23 coronary calcium CT score is 6.51. On fish oil      Relevant Orders   TSH (Completed)   Urinalysis   CBC with Differential/Platelet (Completed)   Lipid panel (Completed)   Comprehensive metabolic panel with GFR (Completed)   Hemoglobin A1c (Completed)    Diabetes (HCC)   Increase Mounjaro  to 10 mg/wk      Relevant Medications   tirzepatide  (MOUNJARO ) 10 MG/0.5ML Pen   Other Relevant Orders  Hemoglobin A1c (Completed)      Meds ordered this encounter  Medications   tirzepatide  (ZEPBOUND ) 10 MG/0.5ML Pen    Sig: Inject 10 mg into the skin once a week.    Dispense:  2 mL    Refill:  11   tirzepatide  (MOUNJARO ) 10 MG/0.5ML Pen    Sig: Inject 10 mg into the skin once a week.    Dispense:  2 mL    Refill:  11      Follow-up: Return in about 3 months (around 11/27/2024) for a follow-up visit.  Marolyn Noel, MD "

## 2024-08-29 NOTE — Patient Instructions (Signed)
 [employer] has recently reduced the self-pay cash prices for Zepbound  single-dose vials purchased through the [employer] online pharmacy   The new prices for a four-week supply (four single-dose vials) for self-paying patients are:   2.5 mg dose: $299 per month (previously $349) 5 mg dose: $399 per month (previously $499) 7.5 mg, 10 mg, 12.5 mg, and 15 mg doses: $449 per month (previously $499)

## 2024-09-05 ENCOUNTER — Ambulatory Visit (HOSPITAL_COMMUNITY)
Admission: RE | Admit: 2024-09-05 | Discharge: 2024-09-05 | Disposition: A | Discharge status: Home / Self Care | Source: Ambulatory Visit | Attending: Internal Medicine | Admitting: Internal Medicine

## 2024-09-05 DIAGNOSIS — R55 Syncope and collapse: Secondary | ICD-10-CM | POA: Insufficient documentation

## 2024-09-05 LAB — ECHOCARDIOGRAM COMPLETE
Area-P 1/2: 3.48 cm2
S' Lateral: 2.9 cm

## 2024-09-06 ENCOUNTER — Other Ambulatory Visit: Payer: Self-pay | Admitting: Internal Medicine

## 2024-09-06 DIAGNOSIS — I10 Essential (primary) hypertension: Secondary | ICD-10-CM

## 2024-09-08 ENCOUNTER — Ambulatory Visit: Payer: Self-pay | Admitting: Internal Medicine

## 2024-09-10 ENCOUNTER — Encounter: Admitting: Internal Medicine

## 2024-09-15 NOTE — Assessment & Plan Note (Signed)
 Diet, fasting On Zepbound  pens (patient's choice) Continue with weight loss effort

## 2024-09-15 NOTE — Assessment & Plan Note (Signed)
We discussed age appropriate health related issues, including available/recomended screening tests and vaccinations. We discussed a need for adhering to healthy diet and exercise. Labs ordered. All questions were answered. She has a GYN Dr Clotilde Dieter q 12 mo Colon up to date Coronary calcium CT score of 6.51 Agatston units 2020 2/23 coronary calcium CT score is 6.51. On fish oil

## 2024-09-18 ENCOUNTER — Encounter: Payer: Self-pay | Admitting: Internal Medicine

## 2024-09-23 ENCOUNTER — Other Ambulatory Visit: Payer: Self-pay | Admitting: Internal Medicine

## 2024-09-23 MED ORDER — ZEPBOUND 12.5 MG/0.5ML ~~LOC~~ SOAJ: 12.5000 mg | SUBCUTANEOUS | 5 refills | Status: AC
